# Patient Record
Sex: Female | Born: 1975 | Race: White | Hispanic: No | Smoking: Never smoker
Health system: Southern US, Community
[De-identification: ages and names within clinical notes are randomized; demographics above are authoritative.]

## PROBLEM LIST (undated history)

## (undated) ENCOUNTER — Emergency Department (HOSPITAL_COMMUNITY): Admission: EM | Payer: 59 | Source: Home / Self Care

## (undated) DIAGNOSIS — N888 Other specified noninflammatory disorders of cervix uteri: Secondary | ICD-10-CM

## (undated) DIAGNOSIS — Z309 Encounter for contraceptive management, unspecified: Secondary | ICD-10-CM

## (undated) DIAGNOSIS — R87629 Unspecified abnormal cytological findings in specimens from vagina: Secondary | ICD-10-CM

## (undated) DIAGNOSIS — F329 Major depressive disorder, single episode, unspecified: Secondary | ICD-10-CM

## (undated) DIAGNOSIS — G5600 Carpal tunnel syndrome, unspecified upper limb: Secondary | ICD-10-CM

## (undated) DIAGNOSIS — K589 Irritable bowel syndrome without diarrhea: Secondary | ICD-10-CM

## (undated) DIAGNOSIS — E039 Hypothyroidism, unspecified: Secondary | ICD-10-CM

## (undated) DIAGNOSIS — N921 Excessive and frequent menstruation with irregular cycle: Secondary | ICD-10-CM

## (undated) DIAGNOSIS — F419 Anxiety disorder, unspecified: Secondary | ICD-10-CM

## (undated) DIAGNOSIS — G43909 Migraine, unspecified, not intractable, without status migrainosus: Secondary | ICD-10-CM

## (undated) DIAGNOSIS — R6882 Decreased libido: Secondary | ICD-10-CM

## (undated) DIAGNOSIS — R5382 Chronic fatigue, unspecified: Secondary | ICD-10-CM

## (undated) DIAGNOSIS — E079 Disorder of thyroid, unspecified: Secondary | ICD-10-CM

## (undated) DIAGNOSIS — F482 Pseudobulbar affect: Secondary | ICD-10-CM

## (undated) DIAGNOSIS — F4310 Post-traumatic stress disorder, unspecified: Secondary | ICD-10-CM

## (undated) DIAGNOSIS — M3502 Sicca syndrome with lung involvement: Secondary | ICD-10-CM

## (undated) DIAGNOSIS — M351 Other overlap syndromes: Secondary | ICD-10-CM

## (undated) DIAGNOSIS — F909 Attention-deficit hyperactivity disorder, unspecified type: Secondary | ICD-10-CM

## (undated) DIAGNOSIS — Z8742 Personal history of other diseases of the female genital tract: Secondary | ICD-10-CM

## (undated) DIAGNOSIS — I10 Essential (primary) hypertension: Secondary | ICD-10-CM

## (undated) DIAGNOSIS — M359 Systemic involvement of connective tissue, unspecified: Secondary | ICD-10-CM

## (undated) HISTORY — DX: Major depressive disorder, single episode, unspecified: F32.9

## (undated) HISTORY — DX: Unspecified abnormal cytological findings in specimens from vagina: R87.629

## (undated) HISTORY — DX: Essential (primary) hypertension: I10

## (undated) HISTORY — DX: Encounter for contraceptive management, unspecified: Z30.9

## (undated) HISTORY — DX: Excessive and frequent menstruation with irregular cycle: N92.1

## (undated) HISTORY — PX: ABLATION: SHX5711

## (undated) HISTORY — PX: OTHER SURGICAL HISTORY: SHX169

## (undated) HISTORY — DX: Other overlap syndromes: M35.1

## (undated) HISTORY — DX: Other specified noninflammatory disorders of cervix uteri: N88.8

## (undated) HISTORY — DX: Attention-deficit hyperactivity disorder, unspecified type: F90.9

## (undated) HISTORY — DX: Decreased libido: R68.82

## (undated) HISTORY — DX: Pseudobulbar affect: F48.2

## (undated) HISTORY — DX: Personal history of other diseases of the female genital tract: Z87.42

---

## 2001-06-23 ENCOUNTER — Other Ambulatory Visit: Admission: RE | Admit: 2001-06-23 | Discharge: 2001-06-23 | Payer: Self-pay | Admitting: *Deleted

## 2001-09-21 ENCOUNTER — Ambulatory Visit (HOSPITAL_COMMUNITY): Admission: RE | Admit: 2001-09-21 | Discharge: 2001-09-21 | Payer: Self-pay | Admitting: *Deleted

## 2001-09-21 ENCOUNTER — Encounter: Payer: Self-pay | Admitting: *Deleted

## 2001-11-24 ENCOUNTER — Encounter: Payer: Self-pay | Admitting: *Deleted

## 2001-11-24 ENCOUNTER — Ambulatory Visit (HOSPITAL_COMMUNITY): Admission: RE | Admit: 2001-11-24 | Discharge: 2001-11-24 | Payer: Self-pay | Admitting: *Deleted

## 2002-01-03 ENCOUNTER — Ambulatory Visit (HOSPITAL_COMMUNITY): Admission: AD | Admit: 2002-01-03 | Discharge: 2002-01-03 | Payer: Self-pay | Admitting: *Deleted

## 2002-02-18 ENCOUNTER — Inpatient Hospital Stay (HOSPITAL_COMMUNITY): Admission: AD | Admit: 2002-02-18 | Discharge: 2002-02-20 | Payer: Self-pay | Admitting: *Deleted

## 2002-12-29 ENCOUNTER — Encounter: Payer: Self-pay | Admitting: *Deleted

## 2002-12-29 ENCOUNTER — Ambulatory Visit (HOSPITAL_COMMUNITY): Admission: RE | Admit: 2002-12-29 | Discharge: 2002-12-29 | Payer: Self-pay | Admitting: *Deleted

## 2003-05-30 ENCOUNTER — Inpatient Hospital Stay (HOSPITAL_COMMUNITY): Admission: AD | Admit: 2003-05-30 | Discharge: 2003-06-01 | Payer: Self-pay | Admitting: *Deleted

## 2003-11-13 ENCOUNTER — Emergency Department (HOSPITAL_COMMUNITY): Admission: EM | Admit: 2003-11-13 | Discharge: 2003-11-13 | Payer: Self-pay | Admitting: Emergency Medicine

## 2008-04-12 ENCOUNTER — Encounter: Payer: Self-pay | Admitting: Orthopedic Surgery

## 2008-04-12 ENCOUNTER — Emergency Department (HOSPITAL_COMMUNITY): Admission: EM | Admit: 2008-04-12 | Discharge: 2008-04-12 | Payer: Self-pay | Admitting: Emergency Medicine

## 2008-04-13 ENCOUNTER — Ambulatory Visit: Payer: Self-pay | Admitting: Orthopedic Surgery

## 2008-04-13 DIAGNOSIS — S52123A Displaced fracture of head of unspecified radius, initial encounter for closed fracture: Secondary | ICD-10-CM | POA: Insufficient documentation

## 2008-04-13 DIAGNOSIS — S82409A Unspecified fracture of shaft of unspecified fibula, initial encounter for closed fracture: Secondary | ICD-10-CM | POA: Insufficient documentation

## 2008-04-14 ENCOUNTER — Telehealth: Payer: Self-pay | Admitting: Orthopedic Surgery

## 2008-04-14 ENCOUNTER — Ambulatory Visit: Payer: Self-pay | Admitting: Orthopedic Surgery

## 2008-05-11 ENCOUNTER — Ambulatory Visit: Payer: Self-pay | Admitting: Orthopedic Surgery

## 2008-06-08 ENCOUNTER — Ambulatory Visit: Payer: Self-pay | Admitting: Orthopedic Surgery

## 2008-12-18 ENCOUNTER — Emergency Department (HOSPITAL_COMMUNITY): Admission: EM | Admit: 2008-12-18 | Discharge: 2008-12-18 | Payer: Self-pay | Admitting: Family Medicine

## 2009-03-08 ENCOUNTER — Ambulatory Visit (HOSPITAL_COMMUNITY): Admission: RE | Admit: 2009-03-08 | Discharge: 2009-03-08 | Payer: Self-pay | Admitting: Internal Medicine

## 2010-04-23 ENCOUNTER — Other Ambulatory Visit: Admission: RE | Admit: 2010-04-23 | Discharge: 2010-04-23 | Payer: Self-pay | Admitting: Obstetrics & Gynecology

## 2010-04-27 ENCOUNTER — Ambulatory Visit (HOSPITAL_COMMUNITY): Admission: RE | Admit: 2010-04-27 | Discharge: 2010-04-27 | Payer: Self-pay | Admitting: Family Medicine

## 2010-04-30 ENCOUNTER — Encounter (HOSPITAL_COMMUNITY): Admission: RE | Admit: 2010-04-30 | Discharge: 2010-05-30 | Payer: Self-pay | Admitting: Family Medicine

## 2010-05-29 ENCOUNTER — Ambulatory Visit: Payer: Self-pay | Admitting: Gastroenterology

## 2010-05-29 DIAGNOSIS — K589 Irritable bowel syndrome without diarrhea: Secondary | ICD-10-CM | POA: Insufficient documentation

## 2010-11-04 ENCOUNTER — Encounter: Payer: Self-pay | Admitting: Internal Medicine

## 2010-11-05 ENCOUNTER — Encounter: Payer: Self-pay | Admitting: Internal Medicine

## 2010-11-06 ENCOUNTER — Ambulatory Visit: Admit: 2010-11-06 | Payer: Self-pay | Admitting: Gastroenterology

## 2010-11-13 NOTE — Miscellaneous (Signed)
Summary: Orders Update  Clinical Lists Changes  Orders: Added new Test order of T-Tissue Transglutamase Ab IgA [phone]) - Signed
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
## 2010-12-02 ENCOUNTER — Emergency Department (HOSPITAL_COMMUNITY)
Admission: EM | Admit: 2010-12-02 | Discharge: 2010-12-03 | Disposition: A | Discharge status: Home / Self Care | Payer: 59 | Attending: Emergency Medicine | Admitting: Emergency Medicine

## 2010-12-02 ENCOUNTER — Emergency Department (HOSPITAL_COMMUNITY): Payer: 59

## 2010-12-02 DIAGNOSIS — G43909 Migraine, unspecified, not intractable, without status migrainosus: Secondary | ICD-10-CM | POA: Insufficient documentation

## 2011-03-01 NOTE — Discharge Summary (Signed)
   NAME:  JENAYA, SAAR                         ACCOUNT NO.:  [id number]   MEDICAL RECORD NO.:  [id number]                   PATIENT TYPE:  INP   LOCATION:  A420                                 FACILITY:  [facility]   PHYSICIAN:  Langley Gauss, M.D.                DATE OF BIRTH:  1976-06-17   DATE OF ADMISSION:  05/30/2003  DATE OF DISCHARGE:  06/01/2003                                 DISCHARGE SUMMARY   DIAGNOSIS:  Intrauterine pregnancy, for induction of labor.   PROCEDURES PERFORMED:  1. May 30, 2003, placement of continuous lumbar epidural analgesia.  2. May 30, 2003, spontaneous assisted vaginal delivery of viable vigorous     female infant.   DISPOSITION:  The patient is doing well with breast-feeding at the time of  discharge.  She will follow up in the office in four to six week's time for  discussion of birth control options.  Tentatively during the prenatal course  there was a discussion regarding the possibility of her husband, Perlie Gold,  having a vasectomy performed.  The patient is given the copy of standard  discharge instructions.   DISCHARGE MEDICATIONS:  Tylox #30 for pain relief from midline episiotomy  and repair.   PERTINENT LABORATORY STUDIES:  A positive blood type, RPR is nonreactive.  Hemoglobin and hematocrit on admission 11.4 and 34.1 with a white count of  8.6.  Postpartum day #1 10.5 and 31.1 with a white count of 11.9.   HOSPITAL COURSE:  See previous dictations.  The patient had epidural and  delivered in a well-controlled manner on May 30, 2003, vaginal delivery  of viable vigorous female infant, delivered over a midline episiotomy with  repair.  No complications associated with the delivery.  Postpartum the  patient did well.  She bonded well with the infant, had good family support.  Initially the infant did not latch on very well with the breast-feeding.  However, through the assistance of the nursing staff the patient began  becoming very  adept at feeding the infant.  Thus, with the infant doing  well, both mother and infant were discharged to home on postpartum day #1-  1/2.  Pediatric care provided by Dr. Ara Kussmaul.                                               Langley Gauss, M.D.    DC/MEDQ  D:  06/02/2003  T:  06/02/2003  Job:  [id number]   cc:   Francoise Schaumann. Halm, D.O.  [address], [address]  [place]  [place] [address]  Fax: [phone]

## 2011-03-01 NOTE — Op Note (Signed)
NAME:  Marisa Johnson, Marisa Johnson                         ACCOUNT NO.:  [id number]   MEDICAL RECORD NO.:  [id number]                   PATIENT TYPE:  INP   LOCATION:  LDR1                                 FACILITY:  [facility]   PHYSICIAN:  Langley Gauss, M.D.                DATE OF BIRTH:  June 28, 1976   DATE OF PROCEDURE:  DATE OF DISCHARGE:                                 OPERATIVE REPORT   DIAGNOSES:  A 40-week intrauterine pregnancy for induction of labor.   PROCEDURES:  1. Placement of epidural.  2. Spontaneous assisted vaginal delivery of an 8 pound 5 ounce female     infant.  3. Midline episiotomy repair.   DELIVERY PERFORMED:  By Dr. Roylene Reason. Lisette Grinder.   ESTIMATED BLOOD LOSS:  Less than 500 cc.   ANALGESIA:  Epidural supplemented with 30 cc of 1% lidocaine in the midline  of the perineal body.   SPECIMENS:  1. Arterial cord gas and cord blood to pathology.  2. Placenta is examined and noted to be apparently intake with a 3-vessel     umbilical cord.   SUMMARY:  Patient admitted for induction of labor; initial examination 2 cm  dilated. Amniotomy was performed with findings of clear amniotic fluid.  Pitocin was initiated.  With onset of active labor patient requested  epidural analgesic.  Epidural was placed without complications.  After  placement of the epidural the patient was examined and noted to be 4 cm  dilated and continuing to drain clear amniotic fluid. Fetal scalp electrode  was placed at that time.   Thereafter, the patient progressed rapidly along the labor curve to 9 cm  dilatation at which time she had significant pelvic pressure. At that time,  5 cc of 2% lidocaine was injected as a bolus to facilitate management of  labor.  With reaching complete dilatation the patient is placed in the  dorsal lithotomy position and Foley catheter is removed.  The patient is  sterilely prepped in the usual manner. She pushed very well during a short  second stage of labor with  distention to the perineal body. A small midline  episiotomy was performed.  The infant was then noted to deliver in a direct  OA position over this midline episiotomy.  A nuchal cord x2 with compression  was reduced at the time of delivery.  Spontaneous rotation then occurred to  a left anterior shoulder position.  Gentle downward traction combined with  expulsive efforts resulted in the delivery of this shoulder beneath the  pubic symphysis as well as the remainder of the infant without difficulty.   A spontaneous and vigorous breathing cry is noted.  The umbilical cord is  then milked towards the infant.  The cord is doubly clamped, and cut; and  the infant is handed to the nursing staff who placed it on the maternal  abdomen for immediate bonding purposes.  Arterial  cord gases and cord blood  are then obtained.  Gentle traction of the umbilical cord results in  separation which upon examination is an umbilical cord with an intact  placenta and a 3-vessel cord.  Examination of the genital tract reveals the  midline episiotomy has not extended. This is easily repaired utilizing  running 2-0 Vicryl suture on the muscles of the perineal body to include the  superficial transverse perineal muscle followed by #0 chromic running lock  suture to the vaginal mucosa followed by a single continuous layer of #0  chromic on the perineal body.   The patient tolerated the delivery very well.  She was taken out of the  dorsal lithotomy position and rolled to her side, at which time the epidural  catheter is removed with the blue tip noted to be intact.                                               Langley Gauss, M.D.    DC/MEDQ  D:  05/30/2003  T:  05/30/2003  Job:  [id number]

## 2011-03-01 NOTE — Discharge Summary (Signed)
[facility]  Patient:    Marisa Johnson, Marisa Johnson Visit Number: [id number] MRN: [id number]          Service Type: OBS Location: 4A A416 01 Attending Physician:  Jeri Cos. Dictated by:   Langley Gauss, M.D. Admit Date:  02/18/2002 Discharge Date: 02/20/2002   CC:         Vivia Ewing, D.O.   Discharge Summary  DIAGNOSES:  A 39 week intrauterine pregnancy for induction of labor.  PROCEDURES PERFORMED: 1. Placement of continuous lumbar analgesia. 2. Spontaneous assisted vaginal delivery 8 pounds 10 ounces female infant. 3. Midline episiotomy repair. 4. Infant circumcision.  DISPOSITION:  Patient is bottle feeding at time of discharge.  DISCHARGE MEDICATIONS:  Tylenol No.3 which can be taken with p.o. Motrin for pain relief, Hemocyte F one p.o. q.d. #30 with no refills.  LABORATORIES:  Admission hemoglobin and hematocrit 9.9/30.5 with postpartum day #1 of 8.3/26.4.  Patient is given a copy of standardized discharge instructions.  She will follow up in the office in four weeks time.  HOSPITAL COURSE:  See previous dictations.  Patient delivered fairly rapidly on Feb 18, 2002 utilizing epidural analgesic.  Postpartum she did very well. She bonded well with the infant.  Had no postpartum complications.  She was thus discharged to home on postpartum day #1-1/2.  Pediatrician Dr. Vivia Ewing. Dictated by:   Langley Gauss, M.D. Attending Physician:  Jeri Cos. DD:  02/22/02 TD:  02/23/02 Job: [id number]

## 2011-03-01 NOTE — Op Note (Signed)
   NAME:  Marisa, Johnson NO.:  [id number]   MEDICAL RECORD NO.:  [id number]                  PATIENT TYPE:   LOCATION:                                       FACILITY:   PHYSICIAN:  Langley Gauss, M.D.                DATE OF BIRTH:   DATE OF PROCEDURE:  05/30/2003  DATE OF DISCHARGE:                                 OPERATIVE REPORT   PROCEDURE:  Placement of epidural L2-L3 interspace performed by Dr. Langley Gauss.   COMPLICATIONS:  None.   SUMMARY:  An appropriate and informed consent obtained.  The patient had  epidurals placed with her 2 previous labor and deliveries.  Continuous  electronic fetal monitoring reveals a reassuring fetal heart rate.  The  patient was placed in the seated position and bony landmarks were  identified.  The L2-L3 interspace was chosen.  The patient's back is  sterilely prepped and draped utilizing the epidural kit; 5 cc of 1%  lidocaine plain injected at the midline of the L2-L3 interspace to raise a  small skin wheal.   The 17-gauge Touhy-Schliff needle was then utilized with loss of resistance  in an air-filled glass syringe to identify entry into the epidural space on  the first attempt without difficulty.  Initial test dose of 5 cc of 1.5%  lidocaine plus epinephrine injected through the epidural needle.  No signs  of CSF or intravascular injection obtained.  The catheter is inserted to a  depth of 5 cm.  The epidural needle is removed.  Aspiration test is  negative.  Second test dose of 2 cc of 1.5% lidocaine plus epinephrine  injected through the epidural catheter.  Again, no signs of CSF or  intravascular injection obtained.   Thus, the catheter is secured into place.  The patient is returned to the  supine position.  The infusion pump is then connected containing the  standard mixture. She will be treated with a bolus of 10 cc followed by a  continuous infusion rate of 12 cc per hour.  Following placement  of the  epidural the patient is noted to be 4 cm dilated, 80% effaced, vertex at a 0  station.  She continues to have a reassuring fetal heart rate.  Fetal scalp  electrode is placed at this time.  The patient does have evidence of a  bilateral block in placed at this time.                                               Langley Gauss, M.D.    DC/MEDQ  D:  05/30/2003  T:  05/30/2003  Job:  [id number]

## 2011-03-01 NOTE — Op Note (Signed)
[facility]  Patient:    Marisa Johnson, SPEARMAN Visit Number: [id number] MRN: [id number]          Service Type: OBS Location: 4A A416 01 Attending Physician:  Jeri Cos. Dictated by:   Roylene Reason. Lisette Grinder, M.D. Proc. Date: 02/18/02 Admit Date:  02/18/2002 Discharge Date: 02/20/2002   CC:         Vivia Ewing, D.O.   Operative Report  DELIVERY NOTE  PREOPERATIVE DIAGNOSIS:  A 39-week intrauterine pregnancy for induction of labor.  POSTOPERATIVE DIAGNOSIS:  A 39-week intrauterine pregnancy for induction of labor.  OPERATION/PROCEDURE:  1. Spontaneous assisted vaginal delivery of an 8 pound 10 ounce female infant.  2. Midline episiotomy repair.  SURGEON:  Roylene Reason. Lisette Grinder, M.D.  ANALGESIA:  Continuous lumbar epidural placed by Dr. Roylene Reason. Lisette Grinder.  This was supplemented with 30 cc of 1% lidocaine in the midline of the perineal body for the episiotomy.  ESTIMATED BLOOD LOSS:  Less than 500 cc.  SPECIMEN:  Arterial cord gas and cord bloods to laboratory.  Placenta was examined and noted to be apparently intact of a 3-vessel umbilical cord.  SUMMARY:  The patient was admitted for induction of labor secondary to psychosocial factors.  Initial examination in the a.m. she was noted to be 2 cm dilated.  I was unable to perform an amniotomy secondary to the patients poor tolerance of the the vaginal examination.  Thus Pitocin induction was initiated.  With onset of active labor pattern a continuous lumbar epidural was placed. This allowed adequate examination of the pelvis revealing the cervix, now, to be 4 cm dilated, 70% effaced and 0 station.  Amniotomy was performed utilizing a fetal scalp electrode.  Clear amniotic fluid was noted.  Fetal scalp electrode was placed.  The patient, thereafter, was continued on Pitocin.  After routine active phase of labor she progressed very rapidly to a complete dilation at which time she had appropriate urge  to push.  She was placed in the dorsolithotomy position, prepped and draped in the usual sterile manner. Foley catheter was removed.  She pushed well with descent of the vertex to the perineal floor at which time lidocaine was injection with distention of the perineum.  A small midline episiotomy was performed.  The infant then delivered in a direct OA position noting midline episiotomy without extension.  The mouth and nares were bulb suctioned of clear amniotic fluid.  Renewed expulsive efforts resulted in a spontaneous rotation to a left anterior shoulder position.  Expulsive efforts plus gentle downward traction resulted in delivery of this anterior shoulder on the pubis symphysis without difficulty.  The remainder of the infant, likewise was delivered without difficulty and the umbilical cord was then milked towards the infant.  The cord was doubly clamped and cut.  Gentle traction on the umbilical cord resulted in separation which, upon examination, appears to be an intact placenta with a 3-vessel umbilical cord.  Arterial cord gas and cord blood had been obtained.  Examination of the genital tract reveals intact periurethral area.  The midline episiotomy was not extended.  This was easily repaired utilizing #0 chromic in a running lock fashion to the vaginal mucosa followed by a 2-layer closure on the perineum.  A deep interrupted layer of #0 Vicryl reapproximating the superficial transverse perineal muscle and then the skin edges are completely reapproximated utilizing continuous running #0 chromic suture.  This results in an excellent repair.  Great care was taken to avoid any strictures  at the vaginal opening; and the mucosa is only loosely approximated.  The patient tolerated the delivery very well.  She has a firm uterus with minimal blood loss.  She is taken out of the dorsolithotomy position, rolled to her side, at which time the epidural catheter is removed with the  blue-tip noted to be intact.  Both mother and infant doing very well following delivery.  The patient plans on utilizing Dr. Milford Cage for pediatric care. Dictated by:   Roylene Reason. Lisette Grinder, M.D. Attending Physician:  Jeri Cos. DD:  02/18/02 TD:  02/20/02 Job: [id number]

## 2011-03-01 NOTE — Op Note (Signed)
[facility]  Patient:    Marisa Johnson, Marisa Johnson Visit Number: [id number] MRN: [id number]          Service Type: OBS Location: 4A A416 01 Attending Physician:  Jeri Cos. Dictated by:   Roylene Reason. Lisette Grinder, M.D. Proc. Date: 02/18/02 Admit Date:  02/18/2002 Discharge Date: 02/20/2002                             Operative Report  OBSTETRICAL PROCEDURE NOTE  OPERATION/PROCEDURE:  Placement of continuous lumbar epidural analgesia  at the L3-L4 interspace performed by Dr. Roylene Reason. Lisette Grinder.  SURGEON:  Roylene Reason. Lisette Grinder, M.D.  COMPLICATIONS:  None.  SUMMARY:  An appropriate and informed consent was obtained.  Continuous electronic fetal monitioring was performed.  The patient was placed in a seated position, at which time bony landmarks were identified.  The L3-L4 interspace was marked in the midline.  The patients back was then sterilely prepped in the usual manner utilizing the epidural kit.  Lidocaine 5 cc was injected at the L4 interspace to raise a small skin wheal. The 17-gauge Tuohy Schliff needle was then utilized with loss of resistance in an air filled glass syringe to identify entry into the epidural space on the first attempt without any difficulty.  Initial test dose of 5 cc of 1.5% lidocaine plus epinephrine was injected through the epidural needle.  No signs of CSF or intravascular injection obtained.  Thus, the epidural catheter was inserted to a depth of 5 cm.  The epidural needle was removed, aspiration test was negative.  Second test dose of 2 cc of 1.5% lidocaine plus epinephrine was injected through the epidural catheter. Again, no signs of CSF or intravascular injection obtained.  The patient is having tingling in the feet consistent with a properly setting up epidural block.  Thus she is connected to the to the infusion pump containing the standard mixture.  She will be treated with a bolus of 9 cc followed by  continuous infusion rate of 12 cc/h.  After completion, the patient is returned to the left lateral tilt position.  Examination immediately following placement of the epidural 4 cm dilated, 70% effaced, vertex at a 0 station.  Amniotomy is performed with findings of clear amniotic fluid and a reassuring fetal heart rate noted after placement of the scalp electrode.  The patient did have evidence of an excellent setting up bilateral block with appropriate labor analgesia.                 PREOPERATIVE DIAGNOSIS:  POSTOPERATIVE DIAGNOSIS:  OPERATION/PROCEDURE:  SURGEON: Dictated by:   Roylene Reason. Lisette Grinder, M.D. Attending Physician:  Jeri Cos. DD:  02/18/02 TD:  02/20/02 Job: [id number]

## 2012-01-26 ENCOUNTER — Ambulatory Visit (HOSPITAL_COMMUNITY)
Admission: RE | Admit: 2012-01-26 | Discharge: 2012-01-26 | Disposition: A | Discharge status: Home / Self Care | Payer: Worker's Compensation | Source: Other Acute Inpatient Hospital | Attending: Emergency Medicine | Admitting: Emergency Medicine

## 2012-07-09 ENCOUNTER — Other Ambulatory Visit: Payer: Self-pay | Admitting: Obstetrics & Gynecology

## 2012-08-11 ENCOUNTER — Other Ambulatory Visit: Payer: Self-pay | Admitting: Adult Health

## 2012-08-11 ENCOUNTER — Other Ambulatory Visit (HOSPITAL_COMMUNITY)
Admission: RE | Admit: 2012-08-11 | Discharge: 2012-08-11 | Disposition: A | Discharge status: Home / Self Care | Payer: 59 | Source: Ambulatory Visit | Attending: Obstetrics and Gynecology | Admitting: Obstetrics and Gynecology

## 2012-08-11 DIAGNOSIS — Z01419 Encounter for gynecological examination (general) (routine) without abnormal findings: Secondary | ICD-10-CM | POA: Insufficient documentation

## 2012-08-11 DIAGNOSIS — R87810 Cervical high risk human papillomavirus (HPV) DNA test positive: Secondary | ICD-10-CM | POA: Insufficient documentation

## 2012-08-11 DIAGNOSIS — Z1151 Encounter for screening for human papillomavirus (HPV): Secondary | ICD-10-CM | POA: Insufficient documentation

## 2012-09-23 ENCOUNTER — Other Ambulatory Visit: Payer: Self-pay | Admitting: Obstetrics & Gynecology

## 2012-12-29 ENCOUNTER — Other Ambulatory Visit: Payer: Self-pay | Admitting: Obstetrics & Gynecology

## 2012-12-29 MED ORDER — LEVONORG-ETH ESTRAD TRIPHASIC PO TABS: 1.0000 | ORAL_TABLET | Freq: Every day | ORAL | Status: DC

## 2013-05-03 ENCOUNTER — Encounter (HOSPITAL_COMMUNITY): Payer: Self-pay

## 2013-05-03 ENCOUNTER — Emergency Department (HOSPITAL_COMMUNITY)
Admission: EM | Admit: 2013-05-03 | Discharge: 2013-05-03 | Disposition: A | Discharge status: Home / Self Care | Payer: 59 | Attending: Emergency Medicine | Admitting: Emergency Medicine

## 2013-05-03 DIAGNOSIS — Z8659 Personal history of other mental and behavioral disorders: Secondary | ICD-10-CM | POA: Insufficient documentation

## 2013-05-03 DIAGNOSIS — R6883 Chills (without fever): Secondary | ICD-10-CM | POA: Insufficient documentation

## 2013-05-03 DIAGNOSIS — Z79899 Other long term (current) drug therapy: Secondary | ICD-10-CM | POA: Insufficient documentation

## 2013-05-03 DIAGNOSIS — R300 Dysuria: Secondary | ICD-10-CM | POA: Insufficient documentation

## 2013-05-03 DIAGNOSIS — N390 Urinary tract infection, site not specified: Secondary | ICD-10-CM | POA: Insufficient documentation

## 2013-05-03 DIAGNOSIS — F411 Generalized anxiety disorder: Secondary | ICD-10-CM | POA: Insufficient documentation

## 2013-05-03 DIAGNOSIS — E079 Disorder of thyroid, unspecified: Secondary | ICD-10-CM | POA: Insufficient documentation

## 2013-05-03 DIAGNOSIS — Z3202 Encounter for pregnancy test, result negative: Secondary | ICD-10-CM | POA: Insufficient documentation

## 2013-05-03 DIAGNOSIS — M549 Dorsalgia, unspecified: Secondary | ICD-10-CM | POA: Insufficient documentation

## 2013-05-03 HISTORY — DX: Disorder of thyroid, unspecified: E07.9

## 2013-05-03 HISTORY — DX: Post-traumatic stress disorder, unspecified: F43.10

## 2013-05-03 HISTORY — DX: Anxiety disorder, unspecified: F41.9

## 2013-05-03 LAB — URINALYSIS, ROUTINE W REFLEX MICROSCOPIC
Bilirubin Urine: NEGATIVE
Hgb urine dipstick: NEGATIVE
Ketones, ur: NEGATIVE mg/dL
Specific Gravity, Urine: 1.015 (ref 1.005–1.030)
Urobilinogen, UA: 0.2 mg/dL (ref 0.0–1.0)

## 2013-05-03 LAB — URINE MICROSCOPIC-ADD ON

## 2013-05-03 MED ORDER — PHENAZOPYRIDINE HCL 200 MG PO TABS: 200.0000 mg | ORAL_TABLET | Freq: Three times a day (TID) | ORAL | Status: DC | PRN

## 2013-05-03 MED ORDER — PHENAZOPYRIDINE HCL 100 MG PO TABS
200.0000 mg | ORAL_TABLET | Freq: Once | ORAL | Status: AC
  Administered 2013-05-03: 200 mg via ORAL
  Filled 2013-05-03: qty 2

## 2013-05-03 MED ORDER — CEFTRIAXONE SODIUM 1 G IJ SOLR
1.0000 g | Freq: Once | INTRAMUSCULAR | Status: AC
  Administered 2013-05-03: 1 g via INTRAMUSCULAR
  Filled 2013-05-03: qty 10

## 2013-05-03 MED ORDER — LIDOCAINE HCL (PF) 1 % IJ SOLN
INTRAMUSCULAR | Status: AC
  Administered 2013-05-03: 2.1 mL
  Filled 2013-05-03: qty 5

## 2013-05-03 MED ORDER — PHENAZOPYRIDINE HCL 100 MG PO TABS: 100.0000 mg | ORAL_TABLET | Freq: Once | ORAL | Status: DC

## 2013-05-03 MED ORDER — ONDANSETRON 8 MG PO TBDP
8.0000 mg | ORAL_TABLET | Freq: Once | ORAL | Status: AC
  Administered 2013-05-03: 8 mg via ORAL
  Filled 2013-05-03: qty 1

## 2013-05-03 MED ORDER — CEPHALEXIN 500 MG PO CAPS: 500.0000 mg | ORAL_CAPSULE | Freq: Four times a day (QID) | ORAL | Status: DC

## 2013-05-03 NOTE — ED Notes (Signed)
Currently being tx for UTI, symptoms now worse with increased pain left flank and dysuria

## 2013-05-03 NOTE — ED Provider Notes (Signed)
History    CSN: [id number] Arrival date & time 05/03/13  0055  First MD Initiated Contact with Patient 05/03/13 0111     Chief Complaint  Patient presents with  . Flank Pain   Patient is a 37 y.o. female presenting with flank pain. The history is provided by the patient.  Flank Pain This is a new problem. The current episode started yesterday. The problem occurs constantly. The problem has been gradually worsening. Associated symptoms include abdominal pain. Pertinent negatives include no chest pain and no shortness of breath. Nothing aggravates the symptoms. Nothing relieves the symptoms. Treatments tried: cipro. The treatment provided no relief.  pt reports she was diagnosed with UTI recently by her PCP She was placed on cipro, completed the course but her symptoms have not improved She reports dysuria, mild abdominal pain and now having left flank pain No fever/vomiting She reports similar to prior UTIs Past Medical History  Diagnosis Date  . PTSD (post-traumatic stress disorder)   . Thyroid disease   . Anxiety    History reviewed. No pertinent past surgical history. No family history on file. History  Substance Use Topics  . Smoking status: Never Smoker   . Smokeless tobacco: Not on file  . Alcohol Use: Yes   OB History   Grav Para Term Preterm Abortions TAB SAB Ect Mult Living                 Review of Systems  Constitutional: Positive for chills. Negative for fever.  Respiratory: Negative for shortness of breath.   Cardiovascular: Negative for chest pain.  Gastrointestinal: Positive for abdominal pain.  Genitourinary: Positive for dysuria and flank pain.  Musculoskeletal: Positive for back pain.  Neurological: Negative for weakness.  All other systems reviewed and are negative.    Allergies  Reglan  Home Medications   Current Outpatient Rx  Name  Route  Sig  Dispense  Refill  . buPROPion (WELLBUTRIN SR) 150 MG 12 hr tablet   Oral   Take 150 mg by  mouth once.         . clonazePAM (KLONOPIN) 1 MG tablet   Oral   Take 1 mg by mouth 2 (two) times daily as needed for anxiety.         . lamoTRIgine (LAMICTAL) 100 MG tablet   Oral   Take 100 mg by mouth daily.         Marland Kitchen levothyroxine (SYNTHROID, LEVOTHROID) 112 MCG tablet   Oral   Take 112 mcg by mouth daily before breakfast.         . traZODone (DESYREL) 50 MG tablet   Oral   Take 12.5 mg by mouth at bedtime.         . cephALEXin (KEFLEX) 500 MG capsule   Oral   Take 1 capsule (500 mg total) by mouth 4 (four) times daily.   40 capsule   0   . levonorgestrel-ethinyl estradiol (TRIVORA, 28,) tablet   Oral   Take 1 tablet by mouth daily.   1 Package   11   . phenazopyridine (PYRIDIUM) 200 MG tablet   Oral   Take 1 tablet (200 mg total) by mouth 3 (three) times daily as needed for pain.   9 tablet   0    BP 126/84  Pulse 89  Temp(Src) 98.1 F (36.7 C) (Oral)  Ht 5\' 3"  (1.6 m)  Wt 139 lb (63.05 kg)  BMI 24.63 kg/m2  SpO2 97% Physical Exam  CONSTITUTIONAL: Well developed/well nourished HEAD: Normocephalic/atraumatic EYES: EOMI ENMT: Mucous membranes moist NECK: supple no meningeal signs CV: S1/S2 noted, no murmurs/rubs/gallops noted LUNGS: Lungs are clear to auscultation bilaterally, no apparent distress ABDOMEN: soft, nontender, no rebound or guarding ZO:XWRU left cva tenderness NEURO: Pt is awake/alert, moves all extremitiesx4 EXTREMITIES: pulses normal, full ROM SKIN: warm, color normal PSYCH: no abnormalities of mood noted  ED Course  Procedures (including critical care time) Labs Reviewed  URINALYSIS, ROUTINE W REFLEX MICROSCOPIC - Abnormal; Notable for the following:    APPearance HAZY (*)    Nitrite POSITIVE (*)    Leukocytes, UA SMALL (*)    All other components within normal limits  URINE MICROSCOPIC-ADD ON - Abnormal; Notable for the following:    Squamous Epithelial / LPF FEW (*)    Bacteria, UA MANY (*)    All other components  within normal limits  URINE CULTURE  PREGNANCY, URINE   No results found. 1. UTI (lower urinary tract infection)     MDM  Nursing notes including past medical history and social history reviewed and considered in documentation Labs/vital reviewed and considered   Pt with uti.  She now reports flank pain, could have early pyelo She is nontoxic and well appearing Loaded with rocephin here Urine for culture and will start keflex She requests pyridium  Joya Gaskins, MD 05/03/13 [id number]

## 2013-05-04 LAB — URINE CULTURE: Colony Count: >=100000

## 2013-05-24 ENCOUNTER — Other Ambulatory Visit (HOSPITAL_COMMUNITY): Payer: Self-pay | Admitting: Internal Medicine

## 2013-05-24 DIAGNOSIS — N390 Urinary tract infection, site not specified: Secondary | ICD-10-CM

## 2013-05-26 ENCOUNTER — Ambulatory Visit (HOSPITAL_COMMUNITY)
Admission: RE | Admit: 2013-05-26 | Discharge: 2013-05-26 | Disposition: A | Discharge status: Home / Self Care | Payer: 59 | Source: Ambulatory Visit | Attending: Internal Medicine | Admitting: Internal Medicine

## 2013-05-26 DIAGNOSIS — N390 Urinary tract infection, site not specified: Secondary | ICD-10-CM | POA: Insufficient documentation

## 2013-09-01 ENCOUNTER — Ambulatory Visit: Payer: Self-pay | Admitting: Gastroenterology

## 2013-09-16 ENCOUNTER — Encounter (INDEPENDENT_AMBULATORY_CARE_PROVIDER_SITE_OTHER): Payer: Self-pay

## 2013-09-16 ENCOUNTER — Encounter: Payer: Self-pay | Admitting: Gastroenterology

## 2013-09-16 ENCOUNTER — Ambulatory Visit (INDEPENDENT_AMBULATORY_CARE_PROVIDER_SITE_OTHER): Payer: 59 | Admitting: Gastroenterology

## 2013-09-16 VITALS — BP 101/68 | HR 75 | Temp 97.0°F | Ht 63.0 in | Wt 144.6 lb

## 2013-09-16 DIAGNOSIS — R112 Nausea with vomiting, unspecified: Secondary | ICD-10-CM

## 2013-09-16 DIAGNOSIS — K589 Irritable bowel syndrome without diarrhea: Secondary | ICD-10-CM

## 2013-09-16 MED ORDER — LINACLOTIDE 290 MCG PO CAPS: 290.0000 ug | ORAL_CAPSULE | Freq: Every day | ORAL | Status: DC

## 2013-09-16 NOTE — Patient Instructions (Signed)
Please have blood work done today and pregnancy screen.   Start taking Linzess 1 capsule each morning, 30 minutes before breakfast. I sent the prescription to your pharmacy and provided a voucher for you.  Please call in about a week with an update. We may need to do an upper endoscopy if the nausea and vomiting is not better with better bowel habits.

## 2013-09-16 NOTE — Progress Notes (Signed)
Primary Care Physician:  Carylon Perches, MD Primary Gastroenterologist:  Dr. Darrick Penna    Chief Complaint  Patient presents with  . Abdominal Pain  . Nausea  . Emesis  . Constipation    HPI:   Marisa Johnson presents today secondary to abdominal pain, nausea, and vomiting. Last seen in Aug 2011 by Dr. Darrick Penna. 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 She was instructed to follow-up in 4 months but did not return. Celiac serologies ordered but never done.   Sees psychiatrist once every 3 months. Diagnosed with Bipolar. 3 month history of nausea, vomiting, bloating, diffuse abdominal pain from epigastric region to lower abdomen. Felt like a knife in LUQ. Felt like intestines were going to rupture. Radiating pain from LUQ to left breast and wrapped around back. BM every 2 weeks. Lots of mucus. Small balls, finally had what looked like a sweet potato. Lower back discomfort, mild. Severe right before bowel movement.   Nausea worsened with eating. Intermittent vomiting after eating. Constant, underlying, left-sided abdomen. No bright red blood per rectum, no melena. Ibuprofen every once in awhile for Migraine if no Excedrin. Denies routine Excedrin use. Denies reflux, indigestion, heartburn, dysphagia. Nausea more often than vomiting, 4-5 times per week. Vomiting several times every other week.   Had vaginal bleeding, old blood appearing, spotting. No rectal bleeding. Has taken 3 pregnancy tests that are negative. Boyfriend has had a vasectomy.   Past Medical History  Diagnosis Date  . PTSD (post-traumatic stress disorder)   . Thyroid disease   . Anxiety   . Bipolar disorder   . Major depressive disorder     Past Surgical History  Procedure Laterality Date  . None      Current Outpatient Prescriptions  Medication Sig Dispense Refill  . buPROPion (WELLBUTRIN SR) 150 MG 12 hr tablet Take 150 mg by mouth once.      . clonazePAM (KLONOPIN) 1 MG tablet Take 1 mg by mouth 2 (two) times daily as needed for  anxiety. Pt takes 1 mg at bedtime      . lamoTRIgine (LAMICTAL) 100 MG tablet Take 100 mg by mouth daily. Pt takes 200 mg at bedtime      . levonorgestrel-ethinyl estradiol (TRIVORA, 28,) tablet Take 1 tablet by mouth daily.  1 Package  11  . levothyroxine (SYNTHROID, LEVOTHROID) 100 MCG tablet Take 100 mcg by mouth daily before breakfast.      . lurasidone (LATUDA) 40 MG TABS tablet Take 40 mg by mouth daily with breakfast. Pt takes 20 mg at bedtime      . phenazopyridine (PYRIDIUM) 200 MG tablet Take 1 tablet (200 mg total) by mouth 3 (three) times daily as needed for pain.  9 tablet  0  . traZODone (DESYREL) 50 MG tablet Take 50 mg by mouth at bedtime. Pt takes 2 tablets at bedtime       No current facility-administered medications for this visit.    Allergies as of 09/16/2013 - Review Complete 09/16/2013  Allergen Reaction Noted  . Abilify [aripiprazole] Other (See Comments) 09/16/2013  . Reglan [metoclopramide]  05/03/2013    Family History  Problem Relation Age of Onset  . Colon cancer Neg Hx     History   Social History  . Marital Status: Legally Separated    Spouse Name: N/A    Number of Children: N/A  . Years of Education: N/A   Occupational History  . 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  Smoking status: Never Smoker   . Smokeless tobacco: Not on file     Comment: Never smoker  . Alcohol Use: Yes     Comment: occasionally  . Drug Use: No  . Sexual Activity: Yes    Birth Control/ Protection: OCP   Other Topics Concern  . Not on file   Social History Narrative  . No narrative on file    Review of Systems: As mentioned in HPI.   Physical Exam: BP 101/68  Pulse 75  Temp(Src) 97 F (36.1 C) (Oral)  Ht 5\' 3"  (1.6 m)  Wt 144 lb 9.6 oz (65.59 kg)  BMI 25.62 kg/m2  LMP 08/23/2013 General:   Alert and oriented. Pleasant and cooperative. Well-nourished and well-developed.  Head:  Normocephalic and atraumatic. Eyes:  Without icterus,  sclera clear and conjunctiva pink.  Ears:  Normal auditory acuity. Nose:  No deformity, discharge,  or lesions. Mouth:  No deformity or lesions, oral mucosa pink.  Neck:  Supple, without mass or thyromegaly. Lungs:  Clear to auscultation bilaterally. No wheezes, rales, or rhonchi. No distress.  Heart:  S1, S2 present without murmurs appreciated.  Abdomen:  +BS, soft, very mild TTP epigastric and left-sided abdomen. non-distended. No HSM noted. No guarding or rebound. No masses appreciated.  Rectal:  Deferred  Msk:  Symmetrical without gross deformities. Normal posture. Extremities:  Without clubbing or edema. Neurologic:  Alert and  oriented x4;  grossly normal neurologically. Skin:  Intact without significant lesions or rashes. Cervical Nodes:  No significant cervical adenopathy. Psych:  Alert and cooperative. Normal mood and affect.

## 2013-09-17 LAB — TISSUE TRANSGLUTAMINASE, IGA: Tissue Transglutaminase Ab, IgA: 2 U/mL (ref <20)

## 2013-09-17 LAB — IGA: IgA: 93 mg/dL (ref 69–380)

## 2013-09-20 DIAGNOSIS — R112 Nausea with vomiting, unspecified: Secondary | ICD-10-CM | POA: Insufficient documentation

## 2013-09-20 NOTE — Assessment & Plan Note (Signed)
Associated with diffuse abdominal pain, bloating, and in the setting of severe constipation. I feel constipation may be playing a role here; however, unable to rule out occult gastritis, PUD. Will trial Linzess to deal with constipation and proceed with EGD IF no improvement in symptoms despite bowel regimen. Doubt gastroparesis as culprit.

## 2013-09-20 NOTE — Assessment & Plan Note (Signed)
Significant constipation, likely source of left-sided and diffuse abdominal discomfort. Needs aggressive management of constipation prior to any invasive procedures. Needs celiac serologies as previously recommended. No concerning lower GI features.   Trial of Linzess 290 mcg daily. Voucher provided Celiac serologies Pregnancy screen Progress report in about a week. May need EGD if no improvement of abdominal pain, nausea/vomiting with better bowel regimen (see N/V)

## 2013-09-21 ENCOUNTER — Other Ambulatory Visit: Payer: Self-pay | Admitting: Obstetrics & Gynecology

## 2013-09-21 ENCOUNTER — Ambulatory Visit (INDEPENDENT_AMBULATORY_CARE_PROVIDER_SITE_OTHER): Payer: 59 | Admitting: Obstetrics & Gynecology

## 2013-09-21 ENCOUNTER — Ambulatory Visit (INDEPENDENT_AMBULATORY_CARE_PROVIDER_SITE_OTHER): Payer: 59

## 2013-09-21 ENCOUNTER — Encounter: Payer: Self-pay | Admitting: Obstetrics & Gynecology

## 2013-09-21 VITALS — BP 100/70 | Ht 63.0 in | Wt 143.0 lb

## 2013-09-21 DIAGNOSIS — N949 Unspecified condition associated with female genital organs and menstrual cycle: Secondary | ICD-10-CM

## 2013-09-21 DIAGNOSIS — R1032 Left lower quadrant pain: Secondary | ICD-10-CM | POA: Insufficient documentation

## 2013-09-21 MED ORDER — DESOGESTREL-ETHINYL ESTRADIOL 0.15-0.02/0.01 MG (21/5) PO TABS: 1.0000 | ORAL_TABLET | Freq: Every day | ORAL | Status: DC

## 2013-09-21 NOTE — Progress Notes (Signed)
Quick Note:  Called and left message on Vm that we will schedule OV in 3 mon for follow up per AS. ______

## 2013-09-21 NOTE — Progress Notes (Signed)
Quick Note:  Called and informed pt. She said she she is having regular BM's daily ( just skipped one day) and she is not having the nausea and vomiting now. She will call if she needs anything. ______

## 2013-09-21 NOTE — Progress Notes (Signed)
CC'd to pcp 
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
## 2013-09-21 NOTE — Progress Notes (Signed)
Quick Note:  Wonderful! Let's have her return to see me in 3 months. ______

## 2013-09-21 NOTE — Addendum Note (Signed)
Addended by: Lazaro Arms on: 09/21/2013 04:38 PM   Modules accepted: Orders

## 2013-09-21 NOTE — Progress Notes (Signed)
Quick Note:  Negative celiac serologies and negative pregnancy test.  How is patient doing with Linzess? If continued nausea and vomiting, she will need an EGD with Dr. Darrick Penna with Propofol. ______

## 2013-09-23 ENCOUNTER — Other Ambulatory Visit: Payer: Self-pay | Admitting: Adult Health

## 2013-09-28 ENCOUNTER — Other Ambulatory Visit: Payer: Self-pay | Admitting: Adult Health

## 2013-09-29 ENCOUNTER — Ambulatory Visit: Payer: Self-pay | Admitting: Gastroenterology

## 2013-10-12 ENCOUNTER — Other Ambulatory Visit: Payer: Self-pay | Admitting: Obstetrics & Gynecology

## 2013-11-12 ENCOUNTER — Other Ambulatory Visit: Payer: Self-pay | Admitting: Nurse Practitioner

## 2013-12-24 ENCOUNTER — Encounter (HOSPITAL_COMMUNITY): Payer: Self-pay | Admitting: Emergency Medicine

## 2013-12-24 ENCOUNTER — Emergency Department (HOSPITAL_COMMUNITY): Payer: 59

## 2013-12-24 ENCOUNTER — Emergency Department (HOSPITAL_COMMUNITY)
Admission: EM | Admit: 2013-12-24 | Discharge: 2013-12-24 | Disposition: A | Discharge status: Home / Self Care | Payer: 59 | Attending: Emergency Medicine | Admitting: Emergency Medicine

## 2013-12-24 DIAGNOSIS — IMO0002 Reserved for concepts with insufficient information to code with codable children: Secondary | ICD-10-CM | POA: Insufficient documentation

## 2013-12-24 DIAGNOSIS — H538 Other visual disturbances: Secondary | ICD-10-CM | POA: Insufficient documentation

## 2013-12-24 DIAGNOSIS — F319 Bipolar disorder, unspecified: Secondary | ICD-10-CM | POA: Insufficient documentation

## 2013-12-24 DIAGNOSIS — R5383 Other fatigue: Secondary | ICD-10-CM

## 2013-12-24 DIAGNOSIS — R5381 Other malaise: Secondary | ICD-10-CM | POA: Insufficient documentation

## 2013-12-24 DIAGNOSIS — R519 Headache, unspecified: Secondary | ICD-10-CM

## 2013-12-24 DIAGNOSIS — F411 Generalized anxiety disorder: Secondary | ICD-10-CM | POA: Insufficient documentation

## 2013-12-24 DIAGNOSIS — R110 Nausea: Secondary | ICD-10-CM | POA: Insufficient documentation

## 2013-12-24 DIAGNOSIS — E079 Disorder of thyroid, unspecified: Secondary | ICD-10-CM | POA: Insufficient documentation

## 2013-12-24 DIAGNOSIS — F4310 Post-traumatic stress disorder, unspecified: Secondary | ICD-10-CM | POA: Insufficient documentation

## 2013-12-24 DIAGNOSIS — M542 Cervicalgia: Secondary | ICD-10-CM | POA: Insufficient documentation

## 2013-12-24 DIAGNOSIS — Z3202 Encounter for pregnancy test, result negative: Secondary | ICD-10-CM | POA: Insufficient documentation

## 2013-12-24 DIAGNOSIS — K589 Irritable bowel syndrome without diarrhea: Secondary | ICD-10-CM | POA: Insufficient documentation

## 2013-12-24 DIAGNOSIS — Z79899 Other long term (current) drug therapy: Secondary | ICD-10-CM | POA: Insufficient documentation

## 2013-12-24 DIAGNOSIS — R51 Headache: Secondary | ICD-10-CM | POA: Insufficient documentation

## 2013-12-24 LAB — URINALYSIS, ROUTINE W REFLEX MICROSCOPIC
Bilirubin Urine: NEGATIVE
Glucose, UA: NEGATIVE mg/dL
Ketones, ur: NEGATIVE mg/dL
Leukocytes, UA: NEGATIVE
Nitrite: NEGATIVE
UROBILINOGEN UA: 0.2 mg/dL (ref 0.0–1.0)
pH: 5.5 (ref 5.0–8.0)

## 2013-12-24 LAB — COMPREHENSIVE METABOLIC PANEL
ALT: 10 U/L (ref 0–35)
AST: 13 U/L (ref 0–37)
Albumin: 3.8 g/dL (ref 3.5–5.2)
Alkaline Phosphatase: 51 U/L (ref 39–117)
BILIRUBIN TOTAL: 0.3 mg/dL (ref 0.3–1.2)
BUN: 14 mg/dL (ref 6–23)
CHLORIDE: 105 meq/L (ref 96–112)
CO2: 26 mEq/L (ref 19–32)
Calcium: 9.2 mg/dL (ref 8.4–10.5)
Creatinine, Ser: 0.86 mg/dL (ref 0.50–1.10)
GFR calc Af Amer: >90 mL/min (ref >90)
GFR, EST NON AFRICAN AMERICAN: 85 mL/min — AB (ref >90)
Glucose, Bld: 88 mg/dL (ref 70–99)
POTASSIUM: 3.6 meq/L — AB (ref 3.7–5.3)
Sodium: 142 mEq/L (ref 137–147)
Total Protein: 7.1 g/dL (ref 6.0–8.3)

## 2013-12-24 LAB — CBC WITH DIFFERENTIAL/PLATELET
Basophils Absolute: 0 10*3/uL (ref 0.0–0.1)
Basophils Relative: 0 % (ref 0–1)
Eosinophils Absolute: 0 10*3/uL (ref 0.0–0.7)
Eosinophils Relative: 1 % (ref 0–5)
HEMATOCRIT: 36.5 % (ref 36.0–46.0)
Hemoglobin: 12.2 g/dL (ref 12.0–15.0)
LYMPHS PCT: 54 % — AB (ref 12–46)
Lymphs Abs: 3.8 10*3/uL (ref 0.7–4.0)
MCH: 31 pg (ref 26.0–34.0)
MCHC: 33.4 g/dL (ref 30.0–36.0)
MCV: 92.6 fL (ref 78.0–100.0)
Monocytes Absolute: 0.5 10*3/uL (ref 0.1–1.0)
Monocytes Relative: 7 % (ref 3–12)
NEUTROS ABS: 2.6 10*3/uL (ref 1.7–7.7)
Neutrophils Relative %: 38 % — ABNORMAL LOW (ref 43–77)
PLATELETS: 213 10*3/uL (ref 150–400)
RBC: 3.94 MIL/uL (ref 3.87–5.11)
RDW: 13.1 % (ref 11.5–15.5)
WBC: 7 10*3/uL (ref 4.0–10.5)

## 2013-12-24 LAB — PREGNANCY, URINE: Preg Test, Ur: NEGATIVE

## 2013-12-24 LAB — D-DIMER, QUANTITATIVE (NOT AT ARMC): D DIMER QUANT: 0.36 ug{FEU}/mL (ref 0.00–0.48)

## 2013-12-24 LAB — URINE MICROSCOPIC-ADD ON

## 2013-12-24 MED ORDER — KETOROLAC TROMETHAMINE 30 MG/ML IJ SOLN
30.0000 mg | Freq: Once | INTRAMUSCULAR | Status: AC
  Administered 2013-12-24: 30 mg via INTRAVENOUS
  Filled 2013-12-24: qty 1

## 2013-12-24 MED ORDER — ONDANSETRON HCL 4 MG PO TABS: 4.0000 mg | ORAL_TABLET | Freq: Four times a day (QID) | ORAL | Status: DC

## 2013-12-24 MED ORDER — DEXAMETHASONE SODIUM PHOSPHATE 4 MG/ML IJ SOLN
10.0000 mg | Freq: Once | INTRAMUSCULAR | Status: AC
  Administered 2013-12-24: 10 mg via INTRAVENOUS
  Filled 2013-12-24: qty 3

## 2013-12-24 MED ORDER — DIPHENHYDRAMINE HCL 50 MG/ML IJ SOLN
25.0000 mg | Freq: Once | INTRAMUSCULAR | Status: AC
  Administered 2013-12-24: 25 mg via INTRAVENOUS
  Filled 2013-12-24: qty 1

## 2013-12-24 MED ORDER — ONDANSETRON HCL 4 MG/2ML IJ SOLN
4.0000 mg | Freq: Once | INTRAMUSCULAR | Status: AC
  Administered 2013-12-24: 4 mg via INTRAVENOUS
  Filled 2013-12-24: qty 2

## 2013-12-24 MED ORDER — HYDROCODONE-ACETAMINOPHEN 5-325 MG PO TABS
1.0000 | ORAL_TABLET | Freq: Once | ORAL | Status: AC
  Administered 2013-12-24: 1 via ORAL
  Filled 2013-12-24: qty 1

## 2013-12-24 MED ORDER — SODIUM CHLORIDE 0.9 % IV BOLUS (SEPSIS)
1000.0000 mL | Freq: Once | INTRAVENOUS | Status: AC
  Administered 2013-12-24: 1000 mL via INTRAVENOUS

## 2013-12-24 MED ORDER — ONDANSETRON 4 MG PO TBDP
4.0000 mg | ORAL_TABLET | Freq: Once | ORAL | Status: AC
  Administered 2013-12-24: 4 mg via ORAL
  Filled 2013-12-24: qty 1

## 2013-12-24 MED ORDER — IBUPROFEN 800 MG PO TABS: 800.0000 mg | ORAL_TABLET | Freq: Three times a day (TID) | ORAL | Status: DC

## 2013-12-24 NOTE — ED Provider Notes (Signed)
CSN: [id number]     Arrival date & time 12/24/13  1307 History  This chart was scribed for Glynn Octave, MD by Bennett Scrape, ED Scribe. This patient was seen in room APA17/AP[facility]17 and the patient's care was started at 2:20 PM.     Chief Complaint  Patient presents with  . Headache     The history is provided by the patient. No language interpreter was used.    HPI Comments: Marisa Johnson is a 38 y.o. female who presents to the Emergency Department complaining of one week a rapid onset HA located in the right frontal region described as nagging and constant. The HA is worsened by loud noises and going from a sitting to a standing position. She reports associated mild neck stiffness and pain, lightheadedness, mild blurry vision, feeling off-balance and mild intermittent confusion. She describes the feeling off-balance as walking to the right. Pt states that she currently feels very fatigued. Pt and family state that the pt is at her normal mentation baseline currently. She has a h/o frequent HAs and has a prescription for Replax. She states that she has tried taking Replax along with OTC pain relievers with no improvement.  She states that this HA is different because it is more "nagging" and radiates around top of the head.  She reports that she called Dr. Ouida Sills last week and was given a prescription for a prednisone taper. She states that she started taking it 4 days ago with no improvement. She states that she has been eating and drinking at her normal baseline which is one stool per week and urinating once a day. She denies any fevers, emesis, CP, abdominal pain and urinary symptoms. She has a h/o hypothyroidism with a recent normal TSH level.     Past Medical History  Diagnosis Date  . PTSD (post-traumatic stress disorder)   . Thyroid disease   . Anxiety   . Bipolar disorder   . Major depressive disorder   IBS per pt  Past Surgical History  Procedure Laterality Date  . None      Family History  Problem Relation Age of Onset  . Colon cancer Neg Hx   . Diabetes Mother   . Hypertension Mother   . Hypertension Father   . Stroke Father   . Hyperlipidemia Maternal Grandmother   . Kidney disease Paternal Grandmother   . Hypertension Paternal Grandmother    History  Substance Use Topics  . Smoking status: Never Smoker   . Smokeless tobacco: Not on file     Comment: Never smoker  . Alcohol Use: No     Comment: occasionally   No OB history provided.  Review of Systems  A complete 10 system review of systems was obtained and all systems are negative except as noted in the HPI and PMH.    Allergies  Abilify and Reglan  Home Medications   Current Outpatient Rx  Name  Route  Sig  Dispense  Refill  . buPROPion (WELLBUTRIN XL) 150 MG 24 hr tablet   Oral   Take 150 mg by mouth daily.         . clonazePAM (KLONOPIN) 0.5 MG tablet   Oral   Take 1 mg by mouth at bedtime as needed for anxiety.         Marland Kitchen desogestrel-ethinyl estradiol (KARIVA,AZURETTE,MIRCETTE) 0.15-0.02/0.01 MG (21/5) tablet   Oral   Take 1 tablet by mouth daily.   1 Package   11   . eletriptan (  RELPAX) 40 MG tablet   Oral   Take 40 mg by mouth every 2 (two) hours as needed for migraine or headache. One tablet by mouth at onset of headache. May repeat in 2 hours if headache persists or recurs.         . lamoTRIgine (LAMICTAL) 200 MG tablet   Oral   Take 200 mg by mouth at bedtime.         Marland Kitchen levothyroxine (SYNTHROID, LEVOTHROID) 100 MCG tablet   Oral   Take 100 mcg by mouth daily.          . Linaclotide (LINZESS) 290 MCG CAPS capsule   Oral   Take 1 capsule (290 mcg total) by mouth daily. Take 30 minutes before breakfast.   30 capsule   11   . Lurasidone HCl (LATUDA) 20 MG TABS   Oral   Take 20 mg by mouth daily.         . ondansetron (ZOFRAN-ODT) 8 MG disintegrating tablet   Oral   Take 8 mg by mouth every 6 (six) hours as needed for nausea or vomiting.          . predniSONE (DELTASONE) 10 MG tablet   Oral   Take 10 mg by mouth daily. 4 tabs x2 days;3 tabs x2 days;2 tabs x2 days; 1 tab x2 days; 1/2 tab x2 days         . traZODone (DESYREL) 50 MG tablet   Oral   Take 50 mg by mouth at bedtime. Pt takes 2 tablets at bedtime         . ibuprofen (ADVIL,MOTRIN) 800 MG tablet   Oral   Take 1 tablet (800 mg total) by mouth 3 (three) times daily.   21 tablet   0   . ondansetron (ZOFRAN) 4 MG tablet   Oral   Take 1 tablet (4 mg total) by mouth every 6 (six) hours.   12 tablet   0    Triage vitals: BP 111/82  Pulse 68  Temp(Src) 98.3 F (36.8 C) (Oral)  Resp 20  Ht 5\' 3"  (1.6 m)  Wt 145 lb (65.772 kg)  BMI 25.69 kg/m2  SpO2 100%  LMP 12/10/2013  Physical Exam  Nursing note and vitals reviewed. Constitutional: She is oriented to person, place, and time. She appears well-developed and well-nourished. No distress.  HENT:  Head: Normocephalic and atraumatic.  Mouth/Throat: Oropharynx is clear and moist. No oropharyngeal exudate.  No temporal artery tenderness  Eyes: Conjunctivae and EOM are normal. Pupils are equal, round, and reactive to light.  Neck: Neck supple. No tracheal deviation present.  No meningismus   Cardiovascular: Normal rate, regular rhythm, normal heart sounds and intact distal pulses.   No murmur heard. Pulmonary/Chest: Effort normal and breath sounds normal. No respiratory distress.  Musculoskeletal: Normal range of motion. She exhibits no edema.  Intact peripheral pulses, no peripheral edema  Neurological: She is alert and oriented to person, place, and time. No cranial nerve deficit. She exhibits normal muscle tone. Coordination normal.  CN 2-12 intact, no ataxia on finger to nose, no nystagmus, 5/5 strength throughout, no pronator drift, Romberg negative, gait normal  Skin: Skin is warm and dry.  Psychiatric: She has a normal mood and affect. Her behavior is normal.    ED Course  Procedures (including  critical care time)  Medications  sodium chloride 0.9 % bolus 1,000 mL (0 mLs Intravenous Stopped 12/24/13 1652)  ondansetron (ZOFRAN) injection 4 mg (4 mg Intravenous  Given 12/24/13 1501)  ketorolac (TORADOL) 30 MG/ML injection 30 mg (30 mg Intravenous Given 12/24/13 1501)  diphenhydrAMINE (BENADRYL) injection 25 mg (25 mg Intravenous Given 12/24/13 1501)  dexamethasone (DECADRON) injection 10 mg (10 mg Intravenous Given 12/24/13 1637)  sodium chloride 0.9 % bolus 1,000 mL (0 mLs Intravenous Stopped 12/24/13 1741)  diphenhydrAMINE (BENADRYL) injection 25 mg (25 mg Intravenous Given 12/24/13 1651)  ondansetron (ZOFRAN-ODT) disintegrating tablet 4 mg (4 mg Oral Given 12/24/13 2043)  HYDROcodone-acetaminophen (NORCO/VICODIN) 5-325 MG per tablet 1 tablet (1 tablet Oral Given 12/24/13 2043)    DIAGNOSTIC STUDIES: Oxygen Saturation is 100% on RA, normal by my interpretation.    COORDINATION OF CARE: 2:30 PM-Discussed treatment plan which includes IV fluids, CT of head, CBC and CMP with pt at bedside and pt agreed to plan.   Labs Review Labs Reviewed  CBC WITH DIFFERENTIAL - Abnormal; Notable for the following:    Neutrophils Relative % 38 (*)    Lymphocytes Relative 54 (*)    All other components within normal limits  COMPREHENSIVE METABOLIC PANEL - Abnormal; Notable for the following:    Potassium 3.6 (*)    GFR calc non Af Amer 85 (*)    All other components within normal limits  URINALYSIS, ROUTINE W REFLEX MICROSCOPIC - Abnormal; Notable for the following:    Specific Gravity, Urine >1.030 (*)    Hgb urine dipstick MODERATE (*)    Protein, ur TRACE (*)    All other components within normal limits  URINE MICROSCOPIC-ADD ON - Abnormal; Notable for the following:    Squamous Epithelial / LPF FEW (*)    Bacteria, UA FEW (*)    All other components within normal limits  PREGNANCY, URINE  D-DIMER, QUANTITATIVE   Imaging Review Dg Chest 2 View  12/24/2013   CLINICAL DATA:  Chest pain and  dizziness  EXAM: CHEST  2 VIEW  COMPARISON:  None.  FINDINGS: The lungs are well-expanded and clear. The cardiac silhouette is normal in size. The mediastinum is normal in width. There is no pleural effusion or pneumothorax or pneumomediastinum. The observed portions of the bony thorax appear normal.  IMPRESSION: There is no evidence of active cardiopulmonary disease.   Electronically Signed   By: David  [place]   On: 12/24/2013 17:56   Ct Head Wo Contrast  12/24/2013   CLINICAL DATA:  Headache  EXAM: CT HEAD WITHOUT CONTRAST  TECHNIQUE: Contiguous axial images were obtained from the base of the skull through the vertex without contrast.  COMPARISON:  12/02/2010  FINDINGS: Normal appearance of the intracranial structures. No evidence for acute hemorrhage, mass lesion, midline shift, hydrocephalus or large infarct. No acute bony abnormality. The visualized sinuses are clear.  IMPRESSION: No acute intracranial abnormality.   Electronically Signed   By: Ruel Favors M.D.   On: 12/24/2013 15:47   Mr Brain Wo Contrast  12/24/2013   CLINICAL DATA:  Dizziness and right frontal headaches. Difficulty with ambulation.  EXAM: MRI HEAD WITHOUT CONTRAST  TECHNIQUE: Multiplanar, multiecho pulse sequences of the brain and surrounding structures were obtained without intravenous contrast.  COMPARISON:  CT head without contrast from the same day.  FINDINGS: No acute infarct, hemorrhage, or mass lesion is present. The ventricles are of normal size. No significant white matter changes are evident. No significant extraaxial fluid collection is present.  Flow is present in the major intracranial arteries. The globes and orbits are intact. The paranasal sinuses and mastoid air cells are clear.  IMPRESSION: 1.  Negative MRI of the brain.   Electronically Signed   By: Gennette Pachris  Mattern M.D.   On: 12/24/2013 20:23   Mr Mrv Head Wo Cm  12/24/2013   CLINICAL DATA:  Dizziness and headache.  Unsteady gait.  EXAM: MR MRV HEAD WITHOUT  CONTRAST  TECHNIQUE: Multiplanar, multisequence MR imaging was performed. No intravenous contrast was administered.  COMPARISON:  MRI brain from the same day.  FINDINGS: The dural sinuses demonstrate normal flow. The right transverse sinus is dominant. The internal jugular veins are patent bilaterally. The cortical veins are patent. The straight sinus and deep cerebral veins are patent.  IMPRESSION: Negative MR venogram   Electronically Signed   By: Gennette Pachris  Mattern M.D.   On: 12/24/2013 20:25     EKG Interpretation None      MDM   Final diagnoses:  Headache   gradual onset headache for the past week associated with nausea, generalized weakness, lightheadedness worse with position change. No focal weakness, numbness or tingling. No fever.  Nonfocal neuro exam. No meningismus. Normal mental status, normal orientation.  Neuro exam is normal.  Negative rhomberg and normal gait.   IVF, antiemetics, toradol, benadryl given. CT negative. Labs unremarkable WBC normal.  Afebrile.  Suspect migrainous headache causing symptoms.  History not consistent with SAH.  Low suspicion for meningitis.  No fever, no meningismus, no neuro deficits, no leukocytosis.  D/w patient option of proceeding with LP.  She has had symptoms x 10 days and appears nontoxic and well. Discussed that if she had bacterial meningitis, she would appear much more ill.   Viral meningitis is a remote possibility but would not change treatment. Patient agrees and wishes not to pursue LP at this time.  In Xray, patient reports felt very dizzy and fell over to the R.  Neuro exam remains unchanged, patient able to ambulate without assistance. She is on birth control, so sinus thrombosis is considered. MRI and MRV are negative.  Patient is ambulatory and tolerating PO. She appears stable for outpatient followup with neurology. She is instructed to return with fever, worsening headache, confusion, behavior change, focal weakness, numbness,  tingling or any other concerns.   I personally performed the services described in this documentation, which was scribed in my presence. The recorded information has been reviewed and is accurate.      Glynn OctaveStephen Shaina Gullatt, MD 12/24/13 2252

## 2013-12-24 NOTE — ED Notes (Signed)
Patient walked to restroom without any assistance. 

## 2013-12-24 NOTE — ED Notes (Signed)
Pt has itching in lower back and groin area after decadron was given.  EDP notified and new orders received

## 2013-12-24 NOTE — Discharge Instructions (Signed)
Migraine Headache There is no evidence of meningitis, blood clot, or stroke. Follow up with Dr. Fagan and Dr. Gerilyn Pilgrimoonquah. Return to the ED iOuida Sillsf you develop new or worsening symptoms. A migraine headache is an intense, throbbing pain on one or both sides of your head. A migraine can last for 30 minutes to several hours. CAUSES  The exact cause of a migraine headache is not always known. However, a migraine may be caused when nerves in the brain become irritated and release chemicals that cause inflammation. This causes pain. Certain things may also trigger migraines, such as:  Alcohol.  Smoking.  Stress.  Menstruation.  Aged cheeses.  Foods or drinks that contain nitrates, glutamate, aspartame, or tyramine.  Lack of sleep.  Chocolate.  Caffeine.  Hunger.  Physical exertion.  Fatigue.  Medicines used to treat chest pain (nitroglycerine), birth control pills, estrogen, and some blood pressure medicines. SIGNS AND SYMPTOMS  Pain on one or both sides of your head.  Pulsating or throbbing pain.  Severe pain that prevents daily activities.  Pain that is aggravated by any physical activity.  Nausea, vomiting, or both.  Dizziness.  Pain with exposure to bright lights, loud noises, or activity.  General sensitivity to bright lights, loud noises, or smells. Before you get a migraine, you may get warning signs that a migraine is coming (aura). An aura may include:  Seeing flashing lights.  Seeing bright spots, halos, or zig-zag lines.  Having tunnel vision or blurred vision.  Having feelings of numbness or tingling.  Having trouble talking.  Having muscle weakness. DIAGNOSIS  A migraine headache is often diagnosed based on:  Symptoms.  Physical exam.  A CT scan or MRI of your head. These imaging tests cannot diagnose migraines, but they can help rule out other causes of headaches. TREATMENT Medicines may be given for pain and nausea. Medicines can also be given  to help prevent recurrent migraines.  HOME CARE INSTRUCTIONS  Only take over-the-counter or prescription medicines for pain or discomfort as directed by your health care provider. The use of long-term narcotics is not recommended.  Lie down in a dark, quiet room when you have a migraine.  Keep a journal to find out what may trigger your migraine headaches. For example, write down:  What you eat and drink.  How much sleep you get.  Any change to your diet or medicines.  Limit alcohol consumption.  Quit smoking if you smoke.  Get 7 9 hours of sleep, or as recommended by your health care provider.  Limit stress.  Keep lights dim if bright lights bother you and make your migraines worse. SEEK IMMEDIATE MEDICAL CARE IF:   Your migraine becomes severe.  You have a fever.  You have a stiff neck.  You have vision loss.  You have muscular weakness or loss of muscle control.  You start losing your balance or have trouble walking.  You feel faint or pass out.  You have severe symptoms that are different from your first symptoms. MAKE SURE YOU:   Understand these instructions.  Will watch your condition.  Will get help right away if you are not doing well or get worse. Document Released: 09/30/2005 Document Revised: 07/21/2013 Document Reviewed: 06/07/2013 [facility] Center[facility] Patient Information 2014 [place]ExitCare, [place].

## 2013-12-24 NOTE — ED Notes (Addendum)
Headache, neck pain, confusion. Dizzy feeling, feels "off balance"  Feels like "electrical shocks"  In my head.  Sx for 11 days

## 2013-12-24 NOTE — ED Notes (Signed)
Pt reports when stood for x ray was very dizzy and head is "throbbing."  Dr. Manus Gunningancour aware and is in room with pt.

## 2014-06-05 ENCOUNTER — Encounter (HOSPITAL_COMMUNITY): Payer: Self-pay | Admitting: Emergency Medicine

## 2014-06-05 ENCOUNTER — Emergency Department (HOSPITAL_COMMUNITY)
Admission: EM | Admit: 2014-06-05 | Discharge: 2014-06-05 | Disposition: A | Discharge status: Home / Self Care | Payer: PRIVATE HEALTH INSURANCE | Attending: Emergency Medicine | Admitting: Emergency Medicine

## 2014-06-05 ENCOUNTER — Emergency Department (HOSPITAL_COMMUNITY): Payer: PRIVATE HEALTH INSURANCE

## 2014-06-05 DIAGNOSIS — S5010XA Contusion of unspecified forearm, initial encounter: Secondary | ICD-10-CM | POA: Insufficient documentation

## 2014-06-05 DIAGNOSIS — Y990 Civilian activity done for income or pay: Secondary | ICD-10-CM | POA: Diagnosis not present

## 2014-06-05 DIAGNOSIS — F4310 Post-traumatic stress disorder, unspecified: Secondary | ICD-10-CM | POA: Insufficient documentation

## 2014-06-05 DIAGNOSIS — IMO0002 Reserved for concepts with insufficient information to code with codable children: Secondary | ICD-10-CM | POA: Insufficient documentation

## 2014-06-05 DIAGNOSIS — E079 Disorder of thyroid, unspecified: Secondary | ICD-10-CM | POA: Insufficient documentation

## 2014-06-05 DIAGNOSIS — S5011XA Contusion of right forearm, initial encounter: Secondary | ICD-10-CM

## 2014-06-05 DIAGNOSIS — F319 Bipolar disorder, unspecified: Secondary | ICD-10-CM | POA: Diagnosis not present

## 2014-06-05 DIAGNOSIS — S46909A Unspecified injury of unspecified muscle, fascia and tendon at shoulder and upper arm level, unspecified arm, initial encounter: Secondary | ICD-10-CM | POA: Insufficient documentation

## 2014-06-05 DIAGNOSIS — Y9389 Activity, other specified: Secondary | ICD-10-CM | POA: Insufficient documentation

## 2014-06-05 DIAGNOSIS — F411 Generalized anxiety disorder: Secondary | ICD-10-CM | POA: Diagnosis not present

## 2014-06-05 DIAGNOSIS — S4980XA Other specified injuries of shoulder and upper arm, unspecified arm, initial encounter: Secondary | ICD-10-CM | POA: Diagnosis present

## 2014-06-05 DIAGNOSIS — Y9289 Other specified places as the place of occurrence of the external cause: Secondary | ICD-10-CM | POA: Insufficient documentation

## 2014-06-05 DIAGNOSIS — Z791 Long term (current) use of non-steroidal anti-inflammatories (NSAID): Secondary | ICD-10-CM | POA: Insufficient documentation

## 2014-06-05 DIAGNOSIS — W2209XA Striking against other stationary object, initial encounter: Secondary | ICD-10-CM | POA: Diagnosis not present

## 2014-06-05 DIAGNOSIS — Z79899 Other long term (current) drug therapy: Secondary | ICD-10-CM | POA: Diagnosis not present

## 2014-06-05 NOTE — ED Provider Notes (Signed)
CSN: [id number]     Arrival date & time 06/05/14  4098 History   First MD Initiated Contact with Patient 06/05/14 0522     Chief Complaint  Patient presents with  . Arm Injury     (Consider location/radiation/quality/duration/timing/severity/associated sxs/prior Treatment) HPI Patient struck her right forearm on a door while at work. She's complaining of distal radius pain. Is also complaining of tingling in her fingers. No weakness. No obvious injury. Past Medical History  Diagnosis Date  . PTSD (post-traumatic stress disorder)   . Thyroid disease   . Anxiety   . Bipolar disorder   . Major depressive disorder    Past Surgical History  Procedure Laterality Date  . None     Family History  Problem Relation Age of Onset  . Colon cancer Neg Hx   . Diabetes Mother   . Hypertension Mother   . Hypertension Father   . Stroke Father   . Hyperlipidemia Maternal Grandmother   . Kidney disease Paternal Grandmother   . Hypertension Paternal Grandmother    History  Substance Use Topics  . Smoking status: Never Smoker   . Smokeless tobacco: Not on file     Comment: Never smoker  . Alcohol Use: No     Comment: occasionally   OB History   Grav Para Term Preterm Abortions TAB SAB Ect Mult Living                 Review of Systems  Musculoskeletal: Negative for arthralgias.  Skin: Negative for wound.  Neurological: Positive for numbness (tingling). Negative for weakness.  All other systems reviewed and are negative.     Allergies  Abilify and Reglan  Home Medications   Prior to Admission medications   Medication Sig Start Date End Date Taking? Authorizing Provider  buPROPion (WELLBUTRIN XL) 150 MG 24 hr tablet Take 150 mg by mouth daily.   Yes Historical Provider, MD  clonazePAM (KLONOPIN) 0.5 MG tablet Take 1 mg by mouth at bedtime as needed for anxiety.   Yes Historical Provider, MD  desogestrel-ethinyl estradiol (KARIVA,AZURETTE,MIRCETTE) 0.15-0.02/0.01 MG (21/5)  tablet Take 1 tablet by mouth daily. 09/21/13  Yes Lazaro Arms, MD  eletriptan (RELPAX) 40 MG tablet Take 40 mg by mouth every 2 (two) hours as needed for migraine or headache. One tablet by mouth at onset of headache. May repeat in 2 hours if headache persists or recurs.   Yes Historical Provider, MD  ibuprofen (ADVIL,MOTRIN) 800 MG tablet Take 1 tablet (800 mg total) by mouth 3 (three) times daily. 12/24/13  Yes Glynn Octave, MD  lamoTRIgine (LAMICTAL) 200 MG tablet Take 200 mg by mouth at bedtime.   Yes Historical Provider, MD  levothyroxine (SYNTHROID, LEVOTHROID) 100 MCG tablet Take 100 mcg by mouth daily.    Yes Historical Provider, MD  Linaclotide (LINZESS) 290 MCG CAPS capsule Take 1 capsule (290 mcg total) by mouth daily. Take 30 minutes before breakfast. 09/16/13  Yes Nira Retort, NP  Lurasidone HCl (LATUDA) 20 MG TABS Take 20 mg by mouth daily.   Yes Historical Provider, MD  ondansetron (ZOFRAN) 4 MG tablet Take 1 tablet (4 mg total) by mouth every 6 (six) hours. 12/24/13  Yes Glynn Octave, MD  ondansetron (ZOFRAN-ODT) 8 MG disintegrating tablet Take 8 mg by mouth every 6 (six) hours as needed for nausea or vomiting.   Yes Historical Provider, MD  traZODone (DESYREL) 50 MG tablet Take 50 mg by mouth at bedtime. Pt takes 2 tablets  at bedtime   Yes Historical Provider, MD  predniSONE (DELTASONE) 10 MG tablet Take 10 mg by mouth daily. 4 tabs x2 days;3 tabs x2 days;2 tabs x2 days; 1 tab x2 days; 1/2 tab x2 days    Historical Provider, MD   BP 109/83  Pulse 84  Temp(Src) 98.4 F (36.9 C) (Oral)  Resp 16  Ht 5' 3.5" (1.613 m)  Wt 167 lb (75.751 kg)  BMI 29.12 kg/m2  SpO2 100%  LMP 05/15/2014 Physical Exam  Nursing note and vitals reviewed. Constitutional: She is oriented to person, place, and time. She appears well-developed and well-nourished. No distress.  HENT:  Head: Normocephalic and atraumatic.  Mouth/Throat: Oropharynx is clear and moist.  Neck: Normal range of motion.  Neck supple.  Pulmonary/Chest: Effort normal.  Musculoskeletal: Normal range of motion. She exhibits no edema and no tenderness.  Tenderness palpation over the distal ulnar aspect of the right forearm. There is no obvious bony deformity. 2+ radial pulses. Good cap refill. Hand is warm. There is no obvious swelling. No snuffbox tenderness.   Neurological: She is alert and oriented to person, place, and time.  Paresthesias to the distal tips of all fingers. Good intrinsic motor movement of the hand.  Skin: Skin is warm and dry. No rash noted. No erythema.  Psychiatric: She has a normal mood and affect. Her behavior is normal.    ED Course  Procedures (including critical care time) Labs Review Labs Reviewed - No data to display  Imaging Review No results found.   EKG Interpretation None      MDM   Final diagnoses:  None    X-ray without a findings of fracture. Likely contusion of the distal forearm. Advise ice, elevation and NSAIDs for pain.    Loren Racer, MD 06/05/14 [phone]

## 2014-06-05 NOTE — Discharge Instructions (Signed)
Contusion °A contusion is a deep bruise. Contusions are the result of an injury that caused bleeding under the skin. The contusion may turn blue, purple, or yellow. Minor injuries will give you a painless contusion, but more severe contusions may stay painful and swollen for a few weeks.  °CAUSES  °A contusion is usually caused by a blow, trauma, or direct force to an area of the body. °SYMPTOMS  °· Swelling and redness of the injured area. °· Bruising of the injured area. °· Tenderness and soreness of the injured area. °· Pain. °DIAGNOSIS  °The diagnosis can be made by taking a history and physical exam. An X-ray, CT scan, or MRI may be needed to determine if there were any associated injuries, such as fractures. °TREATMENT  °Specific treatment will depend on what area of the body was injured. In general, the best treatment for a contusion is resting, icing, elevating, and applying cold compresses to the injured area. Over-the-counter medicines may also be recommended for pain control. Ask your caregiver what the best treatment is for your contusion. °HOME CARE INSTRUCTIONS  °· Put ice on the injured area. °¨ Put ice in a plastic bag. °¨ Place a towel between your skin and the bag. °¨ Leave the ice on for 15-20 minutes, 3-4 times a day, or as directed by your health care provider. °· Only take over-the-counter or prescription medicines for pain, discomfort, or fever as directed by your caregiver. Your caregiver may recommend avoiding anti-inflammatory medicines (aspirin, ibuprofen, and naproxen) for 48 hours because these medicines may increase bruising. °· Rest the injured area. °· If possible, elevate the injured area to reduce swelling. °SEEK IMMEDIATE MEDICAL CARE IF:  °· You have increased bruising or swelling. °· You have pain that is getting worse. °· Your swelling or pain is not relieved with medicines. °MAKE SURE YOU:  °· Understand these instructions. °· Will watch your condition. °· Will get help right  away if you are not doing well or get worse. °Document Released: 07/10/2005 Document Revised: 10/05/2013 Document Reviewed: 08/05/2011 °ExitCare® Patient Information ©2015 [employer]. This information is not intended to replace advice given to you by your health care provider. Make sure you discuss any questions you have with your health care provider. ° °

## 2014-06-05 NOTE — ED Notes (Addendum)
Pt injured arm when entering a room and the large glass door swung into her right forearm causing pain and tingling to forearm and fingers of right hand. Pt is also experiencing some pain to right upper back and into right side of neck with movement.

## 2014-07-07 ENCOUNTER — Other Ambulatory Visit: Payer: Self-pay | Admitting: Obstetrics & Gynecology

## 2014-07-15 ENCOUNTER — Institutional Professional Consult (permissible substitution): Payer: Self-pay | Admitting: Pulmonary Disease

## 2014-07-25 ENCOUNTER — Institutional Professional Consult (permissible substitution): Payer: Self-pay | Admitting: Pulmonary Disease

## 2014-12-10 ENCOUNTER — Emergency Department (HOSPITAL_COMMUNITY)
Admission: EM | Admit: 2014-12-10 | Discharge: 2014-12-10 | Disposition: A | Discharge status: Home / Self Care | Payer: 59 | Attending: Emergency Medicine | Admitting: Emergency Medicine

## 2014-12-10 ENCOUNTER — Encounter (HOSPITAL_COMMUNITY): Payer: Self-pay | Admitting: Emergency Medicine

## 2014-12-10 DIAGNOSIS — Z79899 Other long term (current) drug therapy: Secondary | ICD-10-CM | POA: Insufficient documentation

## 2014-12-10 DIAGNOSIS — E079 Disorder of thyroid, unspecified: Secondary | ICD-10-CM | POA: Diagnosis not present

## 2014-12-10 DIAGNOSIS — F319 Bipolar disorder, unspecified: Secondary | ICD-10-CM | POA: Insufficient documentation

## 2014-12-10 DIAGNOSIS — B029 Zoster without complications: Secondary | ICD-10-CM | POA: Insufficient documentation

## 2014-12-10 DIAGNOSIS — G43909 Migraine, unspecified, not intractable, without status migrainosus: Secondary | ICD-10-CM | POA: Diagnosis not present

## 2014-12-10 DIAGNOSIS — F419 Anxiety disorder, unspecified: Secondary | ICD-10-CM | POA: Diagnosis not present

## 2014-12-10 DIAGNOSIS — R21 Rash and other nonspecific skin eruption: Secondary | ICD-10-CM | POA: Diagnosis present

## 2014-12-10 HISTORY — DX: Migraine, unspecified, not intractable, without status migrainosus: G43.909

## 2014-12-10 HISTORY — DX: Carpal tunnel syndrome, unspecified upper limb: G56.00

## 2014-12-10 MED ORDER — ACYCLOVIR 800 MG PO TABS
800.0000 mg | ORAL_TABLET | Freq: Once | ORAL | Status: AC
  Administered 2014-12-10: 800 mg via ORAL
  Filled 2014-12-10: qty 1

## 2014-12-10 MED ORDER — HYDROCODONE-ACETAMINOPHEN 5-325 MG PO TABS: ORAL_TABLET | ORAL | Status: DC

## 2014-12-10 MED ORDER — ACYCLOVIR 800 MG PO TABS: 800.0000 mg | ORAL_TABLET | Freq: Every day | ORAL | Status: DC

## 2014-12-10 NOTE — Discharge Instructions (Signed)

## 2014-12-10 NOTE — ED Provider Notes (Signed)
CSN: [id number]     Arrival date & time 12/10/14  1447 History   First MD Initiated Contact with Patient 12/10/14 1519     Chief Complaint  Patient presents with  . Rash     (Consider location/radiation/quality/duration/timing/severity/associated sxs/prior Treatment) HPI   Marisa Johnson is a 39 y.o. female who presents to the Emergency Department complaining of painful rash to the right side of her neck and upper chest for four days.  She reports having generalized fatigue and shoulder pain last week that resolved after a few days.  She describes the rash as "little red bumps" that have progressed to "blisters."  She states that she was recently exposed to bed bugs and is concerned this rash is related.  She has tried OTC hydrocortisone cream and benadryl without relief.  She reports minimal itching. She denies fever, new medications, numbness or weakness.   Past Medical History  Diagnosis Date  . PTSD (post-traumatic stress disorder)   . Thyroid disease   . Anxiety   . Bipolar disorder   . Major depressive disorder   . Migraine   . Carpal tunnel syndrome    Past Surgical History  Procedure Laterality Date  . None     Family History  Problem Relation Age of Onset  . Colon cancer Neg Hx   . Diabetes Mother   . Hypertension Mother   . Hypertension Father   . Stroke Father   . Hyperlipidemia Maternal Grandmother   . Kidney disease Paternal Grandmother   . Hypertension Paternal Grandmother    History  Substance Use Topics  . Smoking status: Never Smoker   . Smokeless tobacco: Never Used     Comment: Never smoker  . Alcohol Use: No     Comment: occasionally   OB History    Gravida Para Term Preterm AB TAB SAB Ectopic Multiple Living   Review of Systems  Constitutional: Negative for fever, chills, activity change and appetite change.  HENT: Negative for facial swelling, sore throat and trouble swallowing.   Respiratory: Negative for chest  tightness, shortness of breath and wheezing.   Gastrointestinal: Negative for nausea, vomiting and abdominal pain.  Musculoskeletal: Negative for arthralgias, neck pain and neck stiffness.  Skin: Positive for rash. Negative for wound.  Neurological: Positive for headaches. Negative for dizziness, weakness and numbness.  All other systems reviewed and are negative.     Allergies  Abilify; Reglan; and Dexamethasone  Home Medications   Prior to Admission medications   Medication Sig Start Date End Date Taking? Authorizing Provider  acyclovir (ZOVIRAX) 800 MG tablet Take 1 tablet (800 mg total) by mouth 5 (five) times daily. For 10 days 12/10/14   Ramah Langhans L. Destine Zirkle, PA-C  buPROPion (WELLBUTRIN XL) 150 MG 24 hr tablet Take 150 mg by mouth daily.    Historical Provider, MD  clonazePAM (KLONOPIN) 0.5 MG tablet Take 1 mg by mouth at bedtime as needed for anxiety.    Historical Provider, MD  eletriptan (RELPAX) 40 MG tablet Take 40 mg by mouth every 2 (two) hours as needed for migraine or headache. One tablet by mouth at onset of headache. May repeat in 2 hours if headache persists or recurs.    Historical Provider, MD  HYDROcodone-acetaminophen (NORCO/VICODIN) 5-325 MG per tablet Take one-two tabs po q 4-6 hrs prn pain 12/10/14   Silveria Botz L. Jacqualin Shirkey, PA-C  ibuprofen (ADVIL,MOTRIN) 800 MG tablet Take  1 tablet (800 mg total) by mouth 3 (three) times daily. 12/24/13   Glynn OctaveStephen Rancour, MD  KARIVA 0.15-0.02/0.01 MG (21/5) tablet TAKE 1 TABLET BY MOUTH DAILY. 07/08/14   Lazaro ArmsLuther H Eure, MD  lamoTRIgine (LAMICTAL) 200 MG tablet Take 200 mg by mouth at bedtime.    Historical Provider, MD  levothyroxine (SYNTHROID, LEVOTHROID) 100 MCG tablet Take 100 mcg by mouth daily.     Historical Provider, MD  Linaclotide Karlene Einstein(LINZESS) 290 MCG CAPS capsule Take 1 capsule (290 mcg total) by mouth daily. Take 30 minutes before breakfast. 09/16/13   Nira RetortAnna W Sams, NP  Lurasidone HCl (LATUDA) 20 MG TABS Take 20 mg by mouth daily.     Historical Provider, MD  ondansetron (ZOFRAN) 4 MG tablet Take 1 tablet (4 mg total) by mouth every 6 (six) hours. 12/24/13   Glynn OctaveStephen Rancour, MD  ondansetron (ZOFRAN-ODT) 8 MG disintegrating tablet Take 8 mg by mouth every 6 (six) hours as needed for nausea or vomiting.    Historical Provider, MD  predniSONE (DELTASONE) 10 MG tablet Take 10 mg by mouth daily. 4 tabs x2 days;3 tabs x2 days;2 tabs x2 days; 1 tab x2 days; 1/2 tab x2 days    Historical Provider, MD  traZODone (DESYREL) 50 MG tablet Take 50 mg by mouth at bedtime. Pt takes 2 tablets at bedtime    Historical Provider, MD   BP 118/74 mmHg  Pulse 84  Temp(Src) 98 F (36.7 C) (Oral)  Resp 17  Ht 5\' 3"  (1.6 m)  Wt 168 lb (76.204 kg)  BMI 29.77 kg/m2  SpO2 100%  LMP 12/10/2014 Physical Exam  Constitutional: She is oriented to person, place, and time. She appears well-developed and well-nourished. No distress.  HENT:  Head: Normocephalic and atraumatic.  Mouth/Throat: Oropharynx is clear and moist.  Neck: Normal range of motion. Neck supple.  Cardiovascular: Normal rate, regular rhythm, normal heart sounds and intact distal pulses.   No murmur heard. Pulmonary/Chest: Effort normal and breath sounds normal. No respiratory distress.  Musculoskeletal: She exhibits no edema or tenderness.  Lymphadenopathy:    She has no cervical adenopathy.  Neurological: She is alert and oriented to person, place, and time. She exhibits normal muscle tone. Coordination normal.  Skin: Skin is warm. Rash noted. There is erythema.  Grouped Erythematous papules and vesicles to the right neck, shoulder and upper chest  Nursing note and vitals reviewed.   ED Course  Procedures (including critical care time) Labs Review Labs Reviewed - No data to display  Imaging Review No results found.   EKG Interpretation None      MDM   Final diagnoses:  Herpes zoster    Patient also seen by Dr. Adriana Simasook.  Care plan discussed.  Rash appears c/w herpes  zoster.  Patient is otherwise well appearing and vitals stable. Agrees to acyclovir and close f/u with her PMD later this week.    Shivali Quackenbush L. Trisha Mangleriplett, PA-C 12/11/14 1956  Donnetta HutchingBrian Cook, MD 12/14/14 [phone]

## 2014-12-10 NOTE — ED Notes (Signed)
Pt verbalized understanding of no driving and to use caution within 4 hours of taking pain meds due to meds cause drowsiness 

## 2014-12-10 NOTE — ED Notes (Addendum)
Patient c/o rash to back neck and chest since Tuesday. Patient also c/o headache that started this morning. Per patient has had several headaches this week on right side.

## 2014-12-12 MED FILL — Hydrocodone-Acetaminophen Tab 5-325 MG: ORAL | Qty: 6 | Status: AC

## 2015-04-21 ENCOUNTER — Encounter: Payer: Self-pay | Admitting: Obstetrics and Gynecology

## 2015-04-21 ENCOUNTER — Ambulatory Visit (INDEPENDENT_AMBULATORY_CARE_PROVIDER_SITE_OTHER): Payer: 59 | Admitting: Obstetrics and Gynecology

## 2015-04-21 VITALS — BP 110/76 | Ht 63.0 in | Wt 169.0 lb

## 2015-04-21 DIAGNOSIS — N898 Other specified noninflammatory disorders of vagina: Secondary | ICD-10-CM | POA: Diagnosis not present

## 2015-04-21 LAB — POCT WET PREP WITH KOH
KOH Prep POC: NEGATIVE
Trichomonas, UA: NEGATIVE

## 2015-04-21 MED ORDER — NYSTATIN-TRIAMCINOLONE 100000-0.1 UNIT/GM-% EX OINT: 1.0000 "application " | TOPICAL_OINTMENT | Freq: Two times a day (BID) | CUTANEOUS | Status: DC

## 2015-04-21 MED ORDER — METRONIDAZOLE 500 MG PO TABS: 500.0000 mg | ORAL_TABLET | Freq: Two times a day (BID) | ORAL | Status: DC

## 2015-04-21 MED ORDER — CEPHALEXIN 500 MG PO CAPS: 500.0000 mg | ORAL_CAPSULE | Freq: Three times a day (TID) | ORAL | Status: DC

## 2015-04-21 NOTE — Progress Notes (Addendum)
Patient ID: Marisa Johnson, female   DOB: 1976-06-14, 39 y.o.   MRN: [id number]    [facility] Visit  Patient name: Marisa Johnson MRN [id number]  Date of birth: 1976-05-13  CC & HPI:  Marisa Johnson is a 39 y.o. female presenting today for intermittent, mild vaginal itching and burning pain that started 6 days ago. She notes a diffuse rash on her bilateral legs and mild lower back pain as associated symptoms. Pt reports that she was sitting in a pool for long periods over 2 days before symptoms started. She was wearing a tampon for breakthrough bleeding and only changed it one time daily when at the pool. Pt expects to start menses next week. She has had 1 new sexual partner in the last 6 months.She is back with her longer term partner  ROS:  A complete 10 system review of systems was obtained and all systems are negative except as noted in the HPI and PMH.   Pertinent History Reviewed:   Reviewed. Medical         Past Medical History  Diagnosis Date  . PTSD (post-traumatic stress disorder)   . Thyroid disease   . Anxiety   . Bipolar disorder   . Major depressive disorder   . Migraine   . Carpal tunnel syndrome                               Surgical Hx:    Past Surgical History  Procedure Laterality Date  . None     Medications: Reviewed & Updated - see associated section                       Current outpatient prescriptions:  .  buPROPion (WELLBUTRIN XL) 150 MG 24 hr tablet, Take 150 mg by mouth daily., Disp: , Rfl:  .  clonazePAM (KLONOPIN) 0.5 MG tablet, Take 1 mg by mouth at bedtime as needed for anxiety., Disp: , Rfl:  .  KARIVA 0.15-0.02/0.01 MG (21/5) tablet, TAKE 1 TABLET BY MOUTH DAILY., Disp: 28 tablet, Rfl: 11 .  lamoTRIgine (LAMICTAL) 200 MG tablet, Take 200 mg by mouth at bedtime., Disp: , Rfl:  .  levothyroxine (SYNTHROID, LEVOTHROID) 100 MCG tablet, Take 112 mcg by mouth daily. , Disp: , Rfl:  .  Linaclotide (LINZESS) 290 MCG CAPS capsule, Take 1  capsule (290 mcg total) by mouth daily. Take 30 minutes before breakfast., Disp: 30 capsule, Rfl: 11 .  traZODone (DESYREL) 50 MG tablet, Take 50 mg by mouth at bedtime. Pt takes 2 tablets at bedtime, Disp: , Rfl:  .  ondansetron (ZOFRAN-ODT) 8 MG disintegrating tablet, Take 8 mg by mouth every 6 (six) hours as needed for nausea or vomiting., Disp: , Rfl:    Social History: Reviewed -  reports that she has never smoked. She has never used smokeless tobacco.  Objective Findings:  Vitals: Blood pressure 110/76, height  (1.6 m), weight 169 lb (76.658 kg), last menstrual period 03/25/2015.  Physical Examination: General appearance - alert, well appearing, and in no distress and oriented to person, place, and time Mental status - alert, oriented to person, place, and time, normal mood, behavior, speech, dress, motor activity, and thought processes Pelvic - normal external genitalia, vulva, vagina, cervix, uterus and adnexa VULVA: normal appearing vulva with no masses, tenderness or lesions VAGINA: normal appearing vagina with normal color and discharge, no  lesions CERVIX: normal appearing cervix without discharge or lesions; cervix is moderately irritated; yellow mucoid discharge UTERUS: uterus is normal size, shape, consistency and nontender ADNEXA: normal adnexa in size, nontender and no masses Extremities - Diffuse, nonspecific rash on bilateral thighs and LE  Assessment & Plan:   A:  1. Cervix irritated with mucoid discharge  =BV 2. Vulvar irritation from d/c 3BV 4.R/o STI 5. tonsillitis P:  1. GC/Chlamydia testing 2. Prescribe Mycolog for inflammation 3 Rx metronidazole po 4. Rx Keflex x 7d I personally performed the services described in this documentation, which was SCRIBED in my presence. The recorded information has been reviewed and considered accurate. It has been edited as necessary during review. Tilda BurrowFERGUSON,Saagar Tortorella V, MD     This chart was scribed for Tilda BurrowJohn Javia Dillow V, MD  by Gwenyth Oberatherine Macek, ED Scribe. This patient was seen in room 1 and the patient's care was started at 10:04 AM.

## 2015-04-21 NOTE — Addendum Note (Signed)
Addended by: Tilda BurrowFERGUSON, Cristi Gwynn V on: 04/21/2015 10:24 AM   Modules accepted: Orders

## 2015-04-25 LAB — GC/CHLAMYDIA PROBE AMP
CHLAMYDIA, DNA PROBE: NEGATIVE
NEISSERIA GONORRHOEAE BY PCR: NEGATIVE

## 2015-05-14 NOTE — Progress Notes (Signed)
REVIEWED-NO ADDITIONAL RECOMMENDATIONS. 

## 2015-07-12 ENCOUNTER — Emergency Department (HOSPITAL_COMMUNITY)
Admission: EM | Admit: 2015-07-12 | Discharge: 2015-07-12 | Disposition: A | Discharge status: Home / Self Care | Payer: 59 | Attending: Emergency Medicine | Admitting: Emergency Medicine

## 2015-07-12 ENCOUNTER — Encounter (HOSPITAL_COMMUNITY): Payer: Self-pay

## 2015-07-12 ENCOUNTER — Other Ambulatory Visit: Payer: 59 | Admitting: Adult Health

## 2015-07-12 DIAGNOSIS — G43909 Migraine, unspecified, not intractable, without status migrainosus: Secondary | ICD-10-CM | POA: Insufficient documentation

## 2015-07-12 DIAGNOSIS — F4310 Post-traumatic stress disorder, unspecified: Secondary | ICD-10-CM | POA: Diagnosis not present

## 2015-07-12 DIAGNOSIS — H81399 Other peripheral vertigo, unspecified ear: Secondary | ICD-10-CM | POA: Diagnosis not present

## 2015-07-12 DIAGNOSIS — E876 Hypokalemia: Secondary | ICD-10-CM | POA: Insufficient documentation

## 2015-07-12 DIAGNOSIS — N390 Urinary tract infection, site not specified: Secondary | ICD-10-CM | POA: Diagnosis not present

## 2015-07-12 DIAGNOSIS — Z792 Long term (current) use of antibiotics: Secondary | ICD-10-CM | POA: Insufficient documentation

## 2015-07-12 DIAGNOSIS — F319 Bipolar disorder, unspecified: Secondary | ICD-10-CM | POA: Insufficient documentation

## 2015-07-12 DIAGNOSIS — R42 Dizziness and giddiness: Secondary | ICD-10-CM | POA: Diagnosis present

## 2015-07-12 DIAGNOSIS — E079 Disorder of thyroid, unspecified: Secondary | ICD-10-CM | POA: Insufficient documentation

## 2015-07-12 DIAGNOSIS — F419 Anxiety disorder, unspecified: Secondary | ICD-10-CM | POA: Insufficient documentation

## 2015-07-12 DIAGNOSIS — Z79899 Other long term (current) drug therapy: Secondary | ICD-10-CM | POA: Insufficient documentation

## 2015-07-12 LAB — URINE MICROSCOPIC-ADD ON

## 2015-07-12 LAB — CBC WITH DIFFERENTIAL/PLATELET
BASOS ABS: 0 10*3/uL (ref 0.0–0.1)
BASOS PCT: 1 %
EOS PCT: 3 %
Eosinophils Absolute: 0.1 10*3/uL (ref 0.0–0.7)
HEMATOCRIT: 38 % (ref 36.0–46.0)
Hemoglobin: 12.7 g/dL (ref 12.0–15.0)
LYMPHS PCT: 51 %
Lymphs Abs: 2.6 10*3/uL (ref 0.7–4.0)
MCH: 30.1 pg (ref 26.0–34.0)
MCHC: 33.4 g/dL (ref 30.0–36.0)
MCV: 90 fL (ref 78.0–100.0)
Monocytes Absolute: 0.6 10*3/uL (ref 0.1–1.0)
Monocytes Relative: 11 %
NEUTROS ABS: 1.7 10*3/uL (ref 1.7–7.7)
Neutrophils Relative %: 34 %
PLATELETS: 214 10*3/uL (ref 150–400)
RBC: 4.22 MIL/uL (ref 3.87–5.11)
RDW: 12.5 % (ref 11.5–15.5)
WBC: 5 10*3/uL (ref 4.0–10.5)

## 2015-07-12 LAB — URINALYSIS, ROUTINE W REFLEX MICROSCOPIC
Bilirubin Urine: NEGATIVE
Glucose, UA: NEGATIVE mg/dL
KETONES UR: NEGATIVE mg/dL
LEUKOCYTES UA: NEGATIVE
Nitrite: NEGATIVE
PROTEIN: NEGATIVE mg/dL
Specific Gravity, Urine: 1.025 (ref 1.005–1.030)
UROBILINOGEN UA: 0.2 mg/dL (ref 0.0–1.0)
pH: 6 (ref 5.0–8.0)

## 2015-07-12 LAB — BASIC METABOLIC PANEL
ANION GAP: 6 (ref 5–15)
BUN: 9 mg/dL (ref 6–20)
CALCIUM: 8.2 mg/dL — AB (ref 8.9–10.3)
CO2: 22 mmol/L (ref 22–32)
Chloride: 109 mmol/L (ref 101–111)
Creatinine, Ser: 0.99 mg/dL (ref 0.44–1.00)
GLUCOSE: 87 mg/dL (ref 65–99)
POTASSIUM: 3.3 mmol/L — AB (ref 3.5–5.1)
Sodium: 137 mmol/L (ref 135–145)

## 2015-07-12 MED ORDER — ONDANSETRON HCL 4 MG/2ML IJ SOLN
4.0000 mg | Freq: Once | INTRAMUSCULAR | Status: AC
  Administered 2015-07-12: 4 mg via INTRAVENOUS
  Filled 2015-07-12: qty 2

## 2015-07-12 MED ORDER — CEPHALEXIN 500 MG PO CAPS: 500.0000 mg | ORAL_CAPSULE | Freq: Four times a day (QID) | ORAL | Status: DC

## 2015-07-12 MED ORDER — LORAZEPAM 0.5 MG PO TABS: 1.0000 mg | ORAL_TABLET | Freq: Three times a day (TID) | ORAL | Status: DC | PRN

## 2015-07-12 MED ORDER — MECLIZINE HCL 12.5 MG PO TABS
25.0000 mg | ORAL_TABLET | Freq: Once | ORAL | Status: AC
  Administered 2015-07-12: 25 mg via ORAL
  Filled 2015-07-12: qty 2

## 2015-07-12 MED ORDER — SODIUM CHLORIDE 0.9 % IV BOLUS (SEPSIS)
1000.0000 mL | Freq: Once | INTRAVENOUS | Status: AC
  Administered 2015-07-12: 1000 mL via INTRAVENOUS

## 2015-07-12 MED ORDER — KETOROLAC TROMETHAMINE 30 MG/ML IJ SOLN
30.0000 mg | Freq: Once | INTRAMUSCULAR | Status: AC
  Administered 2015-07-12: 30 mg via INTRAVENOUS
  Filled 2015-07-12: qty 1

## 2015-07-12 MED ORDER — LORAZEPAM 0.5 MG PO TABS
0.5000 mg | ORAL_TABLET | Freq: Once | ORAL | Status: AC
  Administered 2015-07-12: 0.5 mg via ORAL
  Filled 2015-07-12: qty 1

## 2015-07-12 MED ORDER — POTASSIUM CHLORIDE CRYS ER 20 MEQ PO TBCR
20.0000 meq | EXTENDED_RELEASE_TABLET | Freq: Once | ORAL | Status: AC
  Administered 2015-07-12: 20 meq via ORAL
  Filled 2015-07-12: qty 1

## 2015-07-12 NOTE — ED Notes (Signed)
Pt reports has been having multiple symptoms including dizziness, nausea, and weakness in legs since Aug 30.  Pt has a note a bedside that lists symptoms she had had since Aug 30.    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

## 2015-07-12 NOTE — Discharge Instructions (Signed)
Benign Positional Vertigo °Vertigo means you feel like you or your surroundings are moving when they are not. Benign positional vertigo is the most common form of vertigo. Benign means that the cause of your condition is not serious. Benign positional vertigo is more common in older adults. °CAUSES  °Benign positional vertigo is the result of an upset in the labyrinth system. This is an area in the middle ear that helps control your balance. This may be caused by a viral infection, head injury, or repetitive motion. However, often no specific cause is found. °SYMPTOMS  °Symptoms of benign positional vertigo occur when you move your head or eyes in different directions. Some of the symptoms may include: °· Loss of balance and falls. °· Vomiting. °· Blurred vision. °· Dizziness. °· Nausea. °· Involuntary eye movements (nystagmus). °DIAGNOSIS  °Benign positional vertigo is usually diagnosed by physical exam. If the specific cause of your benign positional vertigo is unknown, your caregiver may perform imaging tests, such as magnetic resonance imaging (MRI) or computed tomography (CT). °TREATMENT  °Your caregiver may recommend movements or procedures to correct the benign positional vertigo. Medicines such as meclizine, benzodiazepines, and medicines for nausea may be used to treat your symptoms. In rare cases, if your symptoms are caused by certain conditions that affect the inner ear, you may need surgery. °HOME CARE INSTRUCTIONS  °· Follow your caregiver's instructions. °· Move slowly. Do not make sudden body or head movements. °· Avoid driving. °· Avoid operating heavy machinery. °· Avoid performing any tasks that would be dangerous to you or others during a vertigo episode. °· Drink enough fluids to keep your urine clear or pale yellow. °SEEK IMMEDIATE MEDICAL CARE IF:  °· You develop problems with walking, weakness, numbness, or using your arms, hands, or legs. °· You have difficulty speaking. °· You develop  severe headaches. °· Your nausea or vomiting continues or gets worse. °· You develop visual changes. °· Your family or friends notice any behavioral changes. °· Your condition gets worse. °· You have a fever. °· You develop a stiff neck or sensitivity to light. °MAKE SURE YOU:  °· Understand these instructions. °· Will watch your condition. °· Will get help right away if you are not doing well or get worse. °Document Released: 07/08/2006 Document Revised: 12/23/2011 Document Reviewed: 06/20/2011 °[employer]® Patient Information ©2015 [employer]. This information is not intended to replace advice given to you by your health care provider. Make sure you discuss any questions you have with your health care provider. ° °Urinary Tract Infection °Urinary tract infections (UTIs) can develop anywhere along your urinary tract. Your urinary tract is your body's drainage system for removing wastes and extra water. Your urinary tract includes two kidneys, two ureters, a bladder, and a urethra. Your kidneys are a pair of bean-shaped organs. Each kidney is about the size of your fist. They are located below your ribs, one on each side of your spine. °CAUSES °Infections are caused by microbes, which are microscopic organisms, including fungi, viruses, and bacteria. These organisms are so small that they can only be seen through a microscope. Bacteria are the microbes that most commonly cause UTIs. °SYMPTOMS  °Symptoms of UTIs may vary by age and gender of the patient and by the location of the infection. Symptoms in [place] women typically include a frequent and intense urge to urinate and a painful, burning feeling in the bladder or urethra during urination. Older women and men are more likely to be tired, shaky,   and weak and have muscle aches and abdominal pain. A fever may mean the infection is in your kidneys. Other symptoms of a kidney infection include pain in your back or sides below the ribs, nausea, and  vomiting. DIAGNOSIS To diagnose a UTI, your caregiver will ask you about your symptoms. Your caregiver also will ask to provide a urine sample. The urine sample will be tested for bacteria and white blood cells. White blood cells are made by your body to help fight infection. TREATMENT  Typically, UTIs can be treated with medication. Because most UTIs are caused by a bacterial infection, they usually can be treated with the use of antibiotics. The choice of antibiotic and length of treatment depend on your symptoms and the type of bacteria causing your infection. HOME CARE INSTRUCTIONS  If you were prescribed antibiotics, take them exactly as your caregiver instructs you. Finish the medication even if you feel better after you have only taken some of the medication.  Drink enough water and fluids to keep your urine clear or pale yellow.  Avoid caffeine, tea, and carbonated beverages. They tend to irritate your bladder.  Empty your bladder often. Avoid holding urine for long periods of time.  Empty your bladder before and after sexual intercourse.  After a bowel movement, women should cleanse from front to back. Use each tissue only once. SEEK MEDICAL CARE IF:   You have back pain.  You develop a fever.  Your symptoms do not begin to resolve within 3 days. SEEK IMMEDIATE MEDICAL CARE IF:   You have severe back pain or lower abdominal pain.  You develop chills.  You have nausea or vomiting.  You have continued burning or discomfort with urination. MAKE SURE YOU:   Understand these instructions.  Will watch your condition.  Will get help right away if you are not doing well or get worse. Document Released: 07/10/2005 Document Revised: 03/31/2012 Document Reviewed: 11/08/2011 [facility] Patient Information 2015 [place], [place]. This information is not intended to replace advice given to you by your health care provider. Make sure you discuss any questions you have with your health  care provider.  Urinary Tract Infection Urinary tract infections (UTIs) can develop anywhere along your urinary tract. Your urinary tract is your body's drainage system for removing wastes and extra water. Your urinary tract includes two kidneys, two ureters, a bladder, and a urethra. Your kidneys are a pair of bean-shaped organs. Each kidney is about the size of your fist. They are located below your ribs, one on each side of your spine. CAUSES Infections are caused by microbes, which are microscopic organisms, including fungi, viruses, and bacteria. These organisms are so small that they can only be seen through a microscope. Bacteria are the microbes that most commonly cause UTIs. SYMPTOMS  Symptoms of UTIs may vary by age and gender of the patient and by the location of the infection. Symptoms in young women typically include a frequent and intense urge to urinate and a painful, burning feeling in the bladder or urethra during urination. Older women and men are more likely to be tired, shaky, and weak and have muscle aches and abdominal pain. A fever may mean the infection is in your kidneys. Other symptoms of a kidney infection include pain in your back or sides below the ribs, nausea, and vomiting. DIAGNOSIS To diagnose a UTI, your caregiver will ask you about your symptoms. Your caregiver also will ask to provide a urine sample. The urine sample will  be tested for bacteria and white blood cells. White blood cells are made by your body to help fight infection. TREATMENT  Typically, UTIs can be treated with medication. Because most UTIs are caused by a bacterial infection, they usually can be treated with the use of antibiotics. The choice of antibiotic and length of treatment depend on your symptoms and the type of bacteria causing your infection. HOME CARE INSTRUCTIONS  If you were prescribed antibiotics, take them exactly as your caregiver instructs you. Finish the medication even if you feel  better after you have only taken some of the medication.  Drink enough water and fluids to keep your urine clear or pale yellow.  Avoid caffeine, tea, and carbonated beverages. They tend to irritate your bladder.  Empty your bladder often. Avoid holding urine for long periods of time.  Empty your bladder before and after sexual intercourse.  After a bowel movement, women should cleanse from front to back. Use each tissue only once. SEEK MEDICAL CARE IF:   You have back pain.  You develop a fever.  Your symptoms do not begin to resolve within 3 days. SEEK IMMEDIATE MEDICAL CARE IF:   You have severe back pain or lower abdominal pain.  You develop chills.  You have nausea or vomiting.  You have continued burning or discomfort with urination. MAKE SURE YOU:   Understand these instructions.  Will watch your condition.  Will get help right away if you are not doing well or get worse. Document Released: 07/10/2005 Document Revised: 03/31/2012 Document Reviewed: 11/08/2011 [facility] Patient Information 2015 [place], [place]. This information is not intended to replace advice given to you by your health care provider. Make sure you discuss any questions you have with your health care provider.   Take Ativan along with your meclizine for better improvement in your dizziness.  I recommend holding your clonazepam while you are taking Ativan.  Follow-up with your neurologist as discussed for recheck if you're symptoms are not improving over the next week.  As discussed, you may also want to consider that some of your symptoms, especially the fatigue and drowsiness could be medication side effects.  Please discuss with your provider.

## 2015-07-12 NOTE — ED Notes (Signed)
Discharge instructions given to pt. Verbalized understanding.

## 2015-07-13 NOTE — ED Provider Notes (Signed)
CSN: [id number]     Arrival date & time 07/12/15  4098 History   First MD Initiated Contact with Patient 07/12/15 0820     Chief Complaint  Patient presents with  . Dizziness     (Consider location/radiation/quality/duration/timing/severity/associated sxs/prior Treatment) The history is provided by the patient.   Marisa Johnson is a 39 y.o. female with history as outlined below presenting with persistent dizziness despite being placed on meclizine yesterday by her pcp for benign peripheral vertigo yesterday.  She has so far taken 2 tablets, the last taken this morning before arrival here.  She describes feeling of the room moving with positional changes which improve when still and also has nausea, mild headache not similar to her migraines and generalized fatigue and drowsiness along with leg weakness which has been present for the past month.  She denies headaches, tinnitus, decreased hearing acuity, changes in visual acuity and denies focal weakness or numbness.    [name] has noticed having extreme drowsiness over the past 5 years, stating when she is not working, she is probably home in bed sleeping.  She has no energy. She has spoken to her headache specialist about this who feels she may be on too many sedating medicines.  She has discussed this with her psychiatrist who prescribes most of her medicines but per the patient, is reluctant to change or reduce her medicines.      Past Medical History  Diagnosis Date  . PTSD (post-traumatic stress disorder)   . Thyroid disease   . Anxiety   . Bipolar disorder   . Major depressive disorder   . Migraine   . Carpal tunnel syndrome    Past Surgical History  Procedure Laterality Date  . None     Family History  Problem Relation Age of Onset  . Colon cancer Neg Hx   . Diabetes Mother   . Hypertension Mother   . Hypertension Father   . Stroke Father   . Hyperlipidemia Maternal Grandmother   . Kidney disease Paternal Grandmother   .  Hypertension Paternal Grandmother   . Diabetes Daughter    Social History  Substance Use Topics  . Smoking status: Never Smoker   . Smokeless tobacco: Never Used     Comment: Never smoker  . Alcohol Use: No     Comment: occasionally   OB History    Gravida Para Term Preterm AB TAB SAB Ectopic Multiple Living   Review of Systems  Constitutional: Positive for fatigue. Negative for fever.  HENT: Negative for congestion, ear discharge, hearing loss, sinus pressure and sore throat.   Eyes: Negative.  Negative for visual disturbance.  Respiratory: Negative for chest tightness and shortness of breath.   Cardiovascular: Negative for chest pain.  Gastrointestinal: Positive for nausea. Negative for vomiting and abdominal pain.  Genitourinary: Negative.   Musculoskeletal: Negative for joint swelling, arthralgias and neck pain.  Skin: Negative.  Negative for rash and wound.  Neurological: Positive for dizziness, weakness and headaches. Negative for speech difficulty, light-headedness and numbness.  Psychiatric/Behavioral: Negative.       Allergies  Abilify; Hydrocodone; Reglan; and Dexamethasone  Home Medications   Prior to Admission medications   Medication Sig Start Date End Date Taking? Authorizing Provider  baclofen (LIORESAL) 10 MG tablet Take 10 mg by mouth 2 (two) times daily as needed for muscle spasms (limit 1-2 treatments per week).   Yes Historical  Provider, MD  buPROPion (WELLBUTRIN XL) 300 MG 24 hr tablet Take 300 mg by mouth at bedtime.   Yes Historical Provider, MD  clonazePAM (KLONOPIN) 0.5 MG tablet Take 1 mg by mouth at bedtime as needed for anxiety.   Yes Historical Provider, MD  cyclobenzaprine (FLEXERIL) 10 MG tablet Take 10 mg by mouth 2 (two) times daily.   Yes Historical Provider, MD  KARIVA 0.15-0.02/0.01 MG (21/5) tablet TAKE 1 TABLET BY MOUTH DAILY. 07/08/14  Yes Lazaro Arms, MD  lamoTRIgine (LAMICTAL) 200 MG tablet Take 200 mg by mouth at  bedtime.   Yes Historical Provider, MD  levothyroxine (SYNTHROID, LEVOTHROID) 112 MCG tablet Take 112 mcg by mouth daily before breakfast.   Yes Historical Provider, MD  Lurasidone HCl (LATUDA) 20 MG TABS Take 20 mg by mouth at bedtime.   Yes Historical Provider, MD  meclizine (ANTIVERT) 25 MG tablet Take 25 mg by mouth 3 (three) times daily as needed for dizziness.   Yes Historical Provider, MD  traZODone (DESYREL) 100 MG tablet Take 100 mg by mouth at bedtime.   Yes Historical Provider, MD  zonisamide (ZONEGRAN) 50 MG capsule Take 150 mg by mouth daily.   Yes Historical Provider, MD  cephALEXin (KEFLEX) 500 MG capsule Take 1 capsule (500 mg total) by mouth 4 (four) times daily. 07/12/15   Burgess Amor, PA-C  Linaclotide (LINZESS) 290 MCG CAPS capsule Take 1 capsule (290 mcg total) by mouth daily. Take 30 minutes before breakfast. 09/16/13   Nira Retort, NP  LORazepam (ATIVAN) 0.5 MG tablet Take 2 tablets (1 mg total) by mouth 3 (three) times daily as needed (dizziness). 07/12/15   Burgess Amor, PA-C  metroNIDAZOLE (FLAGYL) 500 MG tablet Take 1 tablet (500 mg total) by mouth 2 (two) times daily. 04/21/15   Tilda Burrow, MD  nystatin-triamcinolone ointment [facility]) Apply 1 application topically 2 (two) times daily. To affected area. 04/21/15   Tilda Burrow, MD  ondansetron (ZOFRAN-ODT) 8 MG disintegrating tablet Take 8 mg by mouth every 6 (Alizza Sacra) hours as needed for nausea or vomiting.    Historical Provider, MD   BP 116/82 mmHg  Pulse 73  Temp(Src) 98.7 F (37.1 C) (Oral)  Resp 18  Ht 5' 6.5" (1.689 m)  Wt 169 lb (76.658 kg)  BMI 26.87 kg/m2  SpO2 98%  LMP 06/22/2015 Physical Exam  Constitutional: She is oriented to person, place, and time. She appears well-developed and well-nourished.  HENT:  Head: Normocephalic and atraumatic.  Mouth/Throat: Oropharynx is clear and moist.  Eyes: EOM are normal. Pupils are equal, round, and reactive to light.  Neck: Normal range of motion. Neck supple.   Cardiovascular: Normal rate and normal heart sounds.   Pulmonary/Chest: Effort normal.  Abdominal: Soft. There is no tenderness.  Musculoskeletal: Normal range of motion.  Lymphadenopathy:    She has no cervical adenopathy.  Neurological: She is alert and oriented to person, place, and time. She has normal strength. No sensory deficit. Coordination and gait normal. GCS eye subscore is 4. GCS verbal subscore is 5. GCS motor subscore is 6.  Normal rapid alternating movements. Cranial nerves III-XII intact.  No pronator drift.   Skin: Skin is warm and dry. No rash noted.  Psychiatric: She has a normal mood and affect. Her speech is normal and behavior is normal. Thought content normal. Cognition and memory are normal.  Nursing note and vitals reviewed.   ED Course  Procedures (including critical care time) Labs Review Labs Reviewed  URINALYSIS, ROUTINE W REFLEX MICROSCOPIC (NOT AT [facility]) - Abnormal; Notable for the following:    Hgb urine dipstick TRACE (*)    All other components within normal limits  BASIC METABOLIC PANEL - Abnormal; Notable for the following:    Potassium 3.3 (*)    Calcium 8.2 (*)    All other components within normal limits  URINE MICROSCOPIC-ADD ON - Abnormal; Notable for the following:    Squamous Epithelial / LPF MANY (*)    Bacteria, UA MANY (*)    Crystals CA OXALATE CRYSTALS (*)    All other components within normal limits  URINE CULTURE  CBC WITH DIFFERENTIAL/PLATELET  POC URINE PREG, ED      MDM   Final diagnoses:  Peripheral vertigo, unspecified laterality  UTI (lower urinary tract infection)  Hypokalemia    Patients labs reviewed and discussed with her.  She was given ativan along with meclizine, zofran with improvement but not complete resolution of the vertigo, nausea better.  She was also given NS bolus along with toradol injection with relief of headache.   UTI , cx sent, pt started on keflex for this.  She was encouraged to continue  taking the meclizine but adding ativan for better sx improvement.  Mildly hypokalemic, oral replacement given.   She has no focal sx suggesting central source of vertigo sx. No recent febrile illness.  She was advised f/u with her pcp and/or her neurologist for recheck if sx are not resolved over the next 5-7 days.  Also advised to hold her clonazepam (only takes prn for sleep) while taking the meclizine and ativan.  Pt understands and agrees with plan.   Burgess Amor, PA-C 07/14/15 2209  Blane Ohara, MD 07/18/15 [phone]

## 2015-07-14 LAB — URINE CULTURE: Culture: >=100000

## 2015-07-15 ENCOUNTER — Telehealth (HOSPITAL_BASED_OUTPATIENT_CLINIC_OR_DEPARTMENT_OTHER): Payer: Self-pay | Admitting: Emergency Medicine

## 2015-07-15 NOTE — Telephone Encounter (Signed)
Post ED Visit - Positive Culture Follow-up  Culture report reviewed by antimicrobial stewardship pharmacist:   Celedonio Miyamoto, Pharm.D., BCPS  Georgina Pillion, Pharm.D., BCPS  [place], [address].D., BCPS, AAHIVP  Estella Husk, Pharm.D., BCPS, AAHIVP  [place], [address].D.  Tennis Must, Pharm.D. Velva Harman PharmD  Positive urine culture lactobacillus Treated with keflex,organism sensitive to same and no  Further pt followup needed at this time.  Berle Mull 07/15/2015, 2:09 PM

## 2015-08-09 ENCOUNTER — Encounter: Payer: Self-pay | Admitting: Adult Health

## 2015-08-09 ENCOUNTER — Other Ambulatory Visit (HOSPITAL_COMMUNITY)
Admission: RE | Admit: 2015-08-09 | Discharge: 2015-08-09 | Disposition: A | Discharge status: Home / Self Care | Payer: 59 | Source: Ambulatory Visit | Attending: Adult Health | Admitting: Adult Health

## 2015-08-09 ENCOUNTER — Ambulatory Visit (INDEPENDENT_AMBULATORY_CARE_PROVIDER_SITE_OTHER): Payer: 59 | Admitting: Adult Health

## 2015-08-09 VITALS — BP 120/70 | HR 94 | Ht 63.25 in | Wt 172.5 lb

## 2015-08-09 DIAGNOSIS — Z113 Encounter for screening for infections with a predominantly sexual mode of transmission: Secondary | ICD-10-CM | POA: Insufficient documentation

## 2015-08-09 DIAGNOSIS — Z8742 Personal history of other diseases of the female genital tract: Secondary | ICD-10-CM | POA: Insufficient documentation

## 2015-08-09 DIAGNOSIS — Z1151 Encounter for screening for human papillomavirus (HPV): Secondary | ICD-10-CM | POA: Diagnosis not present

## 2015-08-09 DIAGNOSIS — Z01419 Encounter for gynecological examination (general) (routine) without abnormal findings: Secondary | ICD-10-CM | POA: Diagnosis not present

## 2015-08-09 DIAGNOSIS — N921 Excessive and frequent menstruation with irregular cycle: Secondary | ICD-10-CM

## 2015-08-09 DIAGNOSIS — Z309 Encounter for contraceptive management, unspecified: Secondary | ICD-10-CM | POA: Insufficient documentation

## 2015-08-09 DIAGNOSIS — Z3041 Encounter for surveillance of contraceptive pills: Secondary | ICD-10-CM

## 2015-08-09 DIAGNOSIS — N888 Other specified noninflammatory disorders of cervix uteri: Secondary | ICD-10-CM

## 2015-08-09 DIAGNOSIS — R6882 Decreased libido: Secondary | ICD-10-CM

## 2015-08-09 HISTORY — DX: Personal history of other diseases of the female genital tract: Z87.42

## 2015-08-09 HISTORY — DX: Encounter for contraceptive management, unspecified: Z30.9

## 2015-08-09 HISTORY — DX: Decreased libido: R68.82

## 2015-08-09 HISTORY — DX: Excessive and frequent menstruation with irregular cycle: N92.1

## 2015-08-09 HISTORY — DX: Other specified noninflammatory disorders of cervix uteri: N88.8

## 2015-08-09 MED ORDER — NORETHIN-ETH ESTRAD-FE BIPHAS 1 MG-10 MCG / 10 MCG PO TABS: 1.0000 | ORAL_TABLET | Freq: Every day | ORAL | Status: DC

## 2015-08-09 MED ORDER — METRONIDAZOLE 0.75 % VA GEL: 1.0000 | Freq: Two times a day (BID) | VAGINAL | Status: DC

## 2015-08-09 NOTE — Progress Notes (Signed)
Patient ID: Marisa Johnson, female   DOB: Aug 03, 1976, 39 y.o.   MRN: [id number] History of Present Illness: Marisa Johnson is a 39 year old white female,in for a well woman gyn exam and pap.She is complaining of BTB,bleeding after sex,&,decreased libido.She has recently ended a relationship. PCP is Dr Ouida SillsFagan and she sees psychiatrist.  Current Medications, Allergies, Past Medical History, Past Surgical History, Family History and Social History were reviewed in [facility] electronic medical record.     Review of Systems: Patient denies any headaches, hearing loss, fatigue, blurred vision, shortness of breath, chest pain, abdominal pain, problems with bowel movements, urination. No joint pain or mood swings.See HPI for positives.    Physical Exam:BP 120/70 mmHg  Pulse 94  Ht 5' 3.25" (1.607 m)  Wt 172 lb 8 oz (78.245 kg)  BMI 30.30 kg/m2  LMP 07/20/2015 General:  Well developed, well nourished, no acute distress Skin:  Warm and dry Neck:  Midline trachea, normal thyroid, good ROM, no lymphadenopathy Lungs; Clear to auscultation bilaterally Breast:  No dominant palpable mass, retraction, or nipple discharge Cardiovascular: Regular rate and rhythm Abdomen:  Soft, non tender, no hepatosplenomegaly Pelvic:  External genitalia is normal in appearance, no lesions.  The vagina is normal in appearance. Urethra has no lesions or masses. The cervix is bulbous and friable with EC brush,pap with HPV and GC/CHL performed.  Uterus is felt to be normal size, shape, and contour.  No adnexal masses or tenderness noted.Bladder is non tender, no masses felt. Extremities/musculoskeletal:  No swelling or varicosities noted, no clubbing or cyanosis Psych:  No mood changes, alert and cooperative,seems happy Discussed emotions and libido and will try low dose pills, discussed she takes other meds that can affect libido.  Impression: Well woman gyn exam with pap BTB Decreased libido Friable cervix Contraceptive  management History of abnormal pap    Plan: Marta AntuFinish kariva then start lo loestrin,rx lo loestrin disp 1 pack take 1 daily, with 11 refills Rx metrogel use bid in vagina, no sex Follow up in 2 weeks for cervix check Physical in 1 year Use condoms

## 2015-08-09 NOTE — Patient Instructions (Signed)
Physical in  1 year [place] SpanishFinish karvia then start lo loestrin Use metrogel Use condoms Follow up in 2 weeks

## 2015-08-14 LAB — CYTOLOGY - PAP

## 2015-08-16 ENCOUNTER — Telehealth: Payer: Self-pay | Admitting: *Deleted

## 2015-08-16 NOTE — Telephone Encounter (Signed)
Spoke with pt. Pt was wondering how long to use the Metrogel. I advised to use until it was all gone. Also, gave pt results on pap; all was negative. Pt voiced understanding. JSY

## 2015-08-23 ENCOUNTER — Encounter: Payer: Self-pay | Admitting: Adult Health

## 2015-08-23 ENCOUNTER — Ambulatory Visit (INDEPENDENT_AMBULATORY_CARE_PROVIDER_SITE_OTHER): Payer: 59 | Admitting: Adult Health

## 2015-08-23 VITALS — BP 106/72 | HR 86 | Ht 63.0 in | Wt 172.5 lb

## 2015-08-23 DIAGNOSIS — N888 Other specified noninflammatory disorders of cervix uteri: Secondary | ICD-10-CM | POA: Diagnosis not present

## 2015-08-23 NOTE — Patient Instructions (Signed)
Follow up prn Call with any spotting or questions

## 2015-08-23 NOTE — Progress Notes (Signed)
Subjective:     Patient ID: Marisa Johnson, female   DOB: Mar 07, 1976, 39 y.o.   MRN: [id number]  HPI Marisa Johnson is a 39 year old white female in for recheck on friable cervix,has had sex with no spotting.  Review of Systems Patient denies any headaches, hearing loss, fatigue, blurred vision, shortness of breath, chest pain, abdominal pain, problems with bowel movements, urination, or intercourse. No joint pain or mood swings. Reviewed past medical,surgical, social and family history. Reviewed medications and allergies.     Objective:   Physical Exam BP 106/72 mmHg  Pulse 86  Ht 5\' 3"  (1.6 m)  Wt 172 lb 8 oz (78.245 kg)  BMI 30.56 kg/m2  LMP 07/20/2015 Skin warm and dry.Pelvic: external genitalia is normal in appearance no lesions, vagina: metrogel in vagina,urethra has no lesions or masses noted, cervix:everted at os but in no longer friable, uterus: normal size, shape and contour, non tender, no masses felt, adnexa: no masses or tenderness noted. Bladder is non tender and no masses felt.     Assessment:    Friable cervix resolved     Plan:     Follow up prn Call with any spotting or concerns

## 2015-11-02 DIAGNOSIS — H10011 Acute follicular conjunctivitis, right eye: Secondary | ICD-10-CM | POA: Diagnosis not present

## 2015-11-02 DIAGNOSIS — Z6829 Body mass index (BMI) 29.0-29.9, adult: Secondary | ICD-10-CM | POA: Diagnosis not present

## 2015-11-08 DIAGNOSIS — F3181 Bipolar II disorder: Secondary | ICD-10-CM | POA: Diagnosis not present

## 2015-12-27 DIAGNOSIS — F3181 Bipolar II disorder: Secondary | ICD-10-CM | POA: Diagnosis not present

## 2016-02-07 DIAGNOSIS — F3181 Bipolar II disorder: Secondary | ICD-10-CM | POA: Diagnosis not present

## 2016-04-04 DIAGNOSIS — S134XXA Sprain of ligaments of cervical spine, initial encounter: Secondary | ICD-10-CM | POA: Diagnosis not present

## 2016-05-28 DIAGNOSIS — M546 Pain in thoracic spine: Secondary | ICD-10-CM | POA: Diagnosis not present

## 2016-05-28 DIAGNOSIS — S335XXA Sprain of ligaments of lumbar spine, initial encounter: Secondary | ICD-10-CM | POA: Diagnosis not present

## 2016-05-28 DIAGNOSIS — S134XXA Sprain of ligaments of cervical spine, initial encounter: Secondary | ICD-10-CM | POA: Diagnosis not present

## 2016-05-30 DIAGNOSIS — F3181 Bipolar II disorder: Secondary | ICD-10-CM | POA: Diagnosis not present

## 2016-07-07 ENCOUNTER — Emergency Department (HOSPITAL_COMMUNITY)
Admission: EM | Admit: 2016-07-07 | Discharge: 2016-07-07 | Disposition: A | Discharge status: Home / Self Care | Payer: 59 | Attending: Emergency Medicine | Admitting: Emergency Medicine

## 2016-07-07 ENCOUNTER — Encounter (HOSPITAL_COMMUNITY): Payer: Self-pay | Admitting: Adult Health

## 2016-07-07 ENCOUNTER — Emergency Department (HOSPITAL_COMMUNITY): Payer: 59

## 2016-07-07 DIAGNOSIS — R202 Paresthesia of skin: Secondary | ICD-10-CM | POA: Insufficient documentation

## 2016-07-07 DIAGNOSIS — M79601 Pain in right arm: Secondary | ICD-10-CM | POA: Diagnosis present

## 2016-07-07 DIAGNOSIS — R51 Headache: Secondary | ICD-10-CM | POA: Diagnosis not present

## 2016-07-07 DIAGNOSIS — R200 Anesthesia of skin: Secondary | ICD-10-CM | POA: Diagnosis not present

## 2016-07-07 HISTORY — DX: Irritable bowel syndrome, unspecified: K58.9

## 2016-07-07 LAB — COMPREHENSIVE METABOLIC PANEL
ALT: 18 U/L (ref 14–54)
ANION GAP: 5 (ref 5–15)
AST: 23 U/L (ref 15–41)
Albumin: 4.1 g/dL (ref 3.5–5.0)
Alkaline Phosphatase: 54 U/L (ref 38–126)
BUN: 15 mg/dL (ref 6–20)
CO2: 24 mmol/L (ref 22–32)
CREATININE: 0.86 mg/dL (ref 0.44–1.00)
Calcium: 8.9 mg/dL (ref 8.9–10.3)
Chloride: 108 mmol/L (ref 101–111)
GFR calc Af Amer: >60 mL/min (ref >60)
GFR calc non Af Amer: >60 mL/min (ref >60)
Glucose, Bld: 103 mg/dL — ABNORMAL HIGH (ref 65–99)
Potassium: 3.9 mmol/L (ref 3.5–5.1)
Sodium: 137 mmol/L (ref 135–145)
Total Bilirubin: 0.6 mg/dL (ref 0.3–1.2)
Total Protein: 7 g/dL (ref 6.5–8.1)

## 2016-07-07 LAB — CBC WITH DIFFERENTIAL/PLATELET
Basophils Absolute: 0 10*3/uL (ref 0.0–0.1)
Basophils Relative: 0 %
Eosinophils Absolute: 0.1 10*3/uL (ref 0.0–0.7)
Eosinophils Relative: 2 %
HEMATOCRIT: 38.1 % (ref 36.0–46.0)
HEMOGLOBIN: 12.9 g/dL (ref 12.0–15.0)
LYMPHS ABS: 2.4 10*3/uL (ref 0.7–4.0)
Lymphocytes Relative: 44 %
MCH: 31.1 pg (ref 26.0–34.0)
MCHC: 33.9 g/dL (ref 30.0–36.0)
MCV: 91.8 fL (ref 78.0–100.0)
Monocytes Absolute: 0.6 10*3/uL (ref 0.1–1.0)
Monocytes Relative: 11 %
NEUTROS ABS: 2.3 10*3/uL (ref 1.7–7.7)
NEUTROS PCT: 43 %
Platelets: 272 10*3/uL (ref 150–400)
RBC: 4.15 MIL/uL (ref 3.87–5.11)
RDW: 12.1 % (ref 11.5–15.5)
WBC: 5.5 10*3/uL (ref 4.0–10.5)

## 2016-07-07 LAB — SEDIMENTATION RATE: Sed Rate: 2 mm/hr (ref 0–22)

## 2016-07-07 LAB — URINALYSIS, ROUTINE W REFLEX MICROSCOPIC
BILIRUBIN URINE: NEGATIVE
Glucose, UA: NEGATIVE mg/dL
Hgb urine dipstick: NEGATIVE
Ketones, ur: NEGATIVE mg/dL
Leukocytes, UA: NEGATIVE
Nitrite: NEGATIVE
Protein, ur: NEGATIVE mg/dL
SPECIFIC GRAVITY, URINE: 1.02 (ref 1.005–1.030)
pH: 8.5 — ABNORMAL HIGH (ref 5.0–8.0)

## 2016-07-07 LAB — POC URINE PREG, ED: Preg Test, Ur: NEGATIVE

## 2016-07-07 LAB — CK: Total CK: 72 U/L (ref 38–234)

## 2016-07-07 MED ORDER — DIAZEPAM 5 MG PO TABS
5.0000 mg | ORAL_TABLET | Freq: Once | ORAL | Status: AC
  Administered 2016-07-07: 5 mg via ORAL
  Filled 2016-07-07: qty 1

## 2016-07-07 MED ORDER — MORPHINE SULFATE (PF) 2 MG/ML IV SOLN
2.0000 mg | INTRAVENOUS | Status: AC | PRN
  Administered 2016-07-07 (×2): 2 mg via INTRAVENOUS
  Filled 2016-07-07 (×2): qty 1

## 2016-07-07 MED ORDER — OXYCODONE-ACETAMINOPHEN 5-325 MG PO TABS
1.0000 | ORAL_TABLET | Freq: Once | ORAL | Status: AC
  Administered 2016-07-07: 1 via ORAL
  Filled 2016-07-07: qty 1

## 2016-07-07 NOTE — ED Provider Notes (Signed)
[facility] DEPT Provider Note   CSN: [id number] Arrival date & time: 07/07/16  4098     History   Chief Complaint Chief Complaint  Patient presents with  . Pain    HPI Marisa Johnson is a 40 y.o. female.  HPI  Pt was seen at 1915. Per pt, c/o gradual onset and persistence of constant acute flair of her chronic bilat UE's and LE's "pain" for the past 9 months. Pt describes the pain as "heaviness" and "burning." States the pains start in her bilat shoulders and radiate into her bilat UE's; start in her bilat hips and radiates into her bilat LE's. Pt has been evaluated by her PMD and has been rx flexeril; without complete improvement. States she has experienced these symptoms intermittently for the past several years, but the symptoms have become constant for the past 9 months. Pt states her symptoms wax and wane but never completely go away.  Denies CP/SOB, no abd pain, no N/V/D, no focal motor weakness, no tingling/numbness in extremities, no rash, no fevers, no neck or back pain, no specific joints pain/swelling.    Past Medical History:  Diagnosis Date  . Anxiety   . Bipolar disorder (HCC)   . Carpal tunnel syndrome   . Contraceptive management 08/09/2015  . Decreased libido 08/09/2015  . Friable cervix 08/09/2015  . History of abnormal cervical Pap smear 08/09/2015  . IBS (irritable bowel syndrome)   . Irregular intermenstrual bleeding 08/09/2015  . Major depressive disorder (HCC)   . Migraine   . Pseudobulbar affect   . PTSD (post-traumatic stress disorder)   . Thyroid disease   . Vaginal Pap smear, abnormal     Patient Active Problem List   Diagnosis Date Noted  . Irregular intermenstrual bleeding 08/09/2015  . Decreased libido 08/09/2015  . Friable cervix 08/09/2015  . Contraceptive management 08/09/2015  . History of abnormal cervical Pap smear 08/09/2015  . Abdominal pain, left lower quadrant 09/21/2013  . Nausea and vomiting 09/20/2013  . IRRITABLE BOWEL  SYNDROME 05/29/2010  . CLOSED FRACTURE OF HEAD OF RADIUS 04/13/2008  . FX CLOSED FIBULA NOS 04/13/2008    Past Surgical History:  Procedure Laterality Date  . None      OB History    Gravida Para Term Preterm AB Living   3 3 3     3    SAB TAB Ectopic Multiple Live Births                   Home Medications    Prior to Admission medications   Medication Sig Start Date End Date Taking? Authorizing Provider  baclofen (LIORESAL) 10 MG tablet Take 10 mg by mouth 2 (two) times daily as needed for muscle spasms (limit 1-2 treatments per week).    Historical Provider, MD  buPROPion (WELLBUTRIN XL) 300 MG 24 hr tablet Take 300 mg by mouth at bedtime.    Historical Provider, MD  clonazePAM (KLONOPIN) 0.5 MG tablet Take 0.5 mg by mouth at bedtime as needed for anxiety.     Historical Provider, MD  lamoTRIgine (LAMICTAL) 200 MG tablet Take 200 mg by mouth at bedtime.    Historical Provider, MD  levothyroxine (SYNTHROID, LEVOTHROID) 112 MCG tablet Take 112 mcg by mouth daily before breakfast.    Historical Provider, MD  Linaclotide (LINZESS) 290 MCG CAPS capsule Take 1 capsule (290 mcg total) by mouth daily. Take 30 minutes before breakfast. Patient taking differently: Take 290 mcg by mouth daily. Prn 09/16/13  Gelene Mink, NP  Lurasidone HCl (LATUDA) 20 MG TABS Take 20 mg by mouth at bedtime.    Historical Provider, MD  meclizine (ANTIVERT) 25 MG tablet Take 25 mg by mouth 3 (three) times daily as needed for dizziness.    Historical Provider, MD  Norethindrone-Ethinyl Estradiol-Fe Biphas (LO LOESTRIN FE) 1 MG-10 MCG / 10 MCG tablet Take 1 tablet by mouth daily. Take 1 daily by mouth 08/09/15   [name], NP  nystatin-triamcinolone ointment [facility]) Apply 1 application topically 2 (two) times daily. To affected area. Patient taking differently: Apply 1 application topically as needed. To affected area. 04/21/15   Tilda Burrow, MD  traZODone (DESYREL) 100 MG tablet Take 100 mg by  mouth at bedtime.    Historical Provider, MD  zonisamide (ZONEGRAN) 50 MG capsule Take 150 mg by mouth daily.    Historical Provider, MD    Family History Family History  Problem Relation Age of Onset  . Diabetes Mother   . Hypertension Mother   . Hypertension Father   . Stroke Father   . Cancer Maternal Grandmother   . Kidney disease Paternal Grandmother   . Hypertension Paternal Grandmother   . Hyperlipidemia Paternal Grandmother   . Heart disease Paternal Grandmother   . Heart attack Paternal Grandmother   . Diabetes Daughter   . Hypothyroidism Daughter   . Anxiety disorder Sister   . Anxiety disorder Brother   . Anxiety disorder Sister   . Colon cancer Neg Hx     Social History Social History  Substance Use Topics  . Smoking status: Never Smoker  . Smokeless tobacco: Never Used     Comment: Never smoker  . Alcohol use No     Allergies   Abilify [aripiprazole]; Reglan [metoclopramide]; and Dexamethasone   Review of Systems Review of Systems ROS: Statement: All systems negative except as marked or noted in the HPI; Constitutional: Negative for fever and chills. ; ; Eyes: Negative for eye pain, redness and discharge. ; ; ENMT: Negative for ear pain, hoarseness, nasal congestion, sinus pressure and sore throat. ; ; Cardiovascular: Negative for chest pain, palpitations, diaphoresis, dyspnea and peripheral edema. ; ; Respiratory: Negative for cough, wheezing and stridor. ; ; Gastrointestinal: Negative for nausea, vomiting, diarrhea, abdominal pain, blood in stool, hematemesis, jaundice and rectal bleeding. . ; ; Genitourinary: Negative for dysuria, flank pain and hematuria. ; ; Musculoskeletal: Negative for back pain and neck pain. Negative for trauma.; ; Skin: Negative for pruritus, rash, abrasions, blisters, bruising and skin lesion.; ; Neuro: +"arms and legs heaviness and pain." Negative for headache, lightheadedness and neck stiffness. Negative for weakness, altered level  of consciousness, altered mental status, extremity weakness, paresthesias, involuntary movement, seizure and syncope.     Physical Exam Updated Vital Signs BP 117/75   Pulse 94   Resp 22   LMP 06/27/2016 (Exact Date)   SpO2 97%   Physical Exam 1920: Physical examination:  Nursing notes reviewed; Vital signs and O2 SAT reviewed;  Constitutional: Well developed, Well nourished, Well hydrated, In no acute distress; Head:  Normocephalic, atraumatic; Eyes: EOMI, PERRL, No scleral icterus; ENMT: Mouth and pharynx normal, Mucous membranes moist; Neck: Supple, Full range of motion, No lymphadenopathy; Cardiovascular: Regular rate and rhythm, No gallop; Respiratory: Breath sounds clear & equal bilaterally, No wheezes.  Speaking full sentences with ease, Normal respiratory effort/excursion; Chest: Nontender, Movement normal; Abdomen: Soft, Nontender, Nondistended, Normal bowel sounds; Genitourinary: No CVA tenderness; Spine:  No midline CS, TS,  LS tenderness. +TTP bilat cervical and lumbar paraspinal muscles.;; Extremities: Pulses normal, Pelvis stable. No UE's or LE's specific joints tenderness, edema, erythema. No deformity  No calf edema or asymmetry.  Motor strength at shoulders normal.  Sensation intact over deltoid regions, distal NMS intact with bilateral hands having intact and equal sensation and strength in the distribution of the median, radial, and ulnar nerve function compared to opposite side.  Strong radial pulses. Bilat UE's and LE's muscles compartments soft.; Neuro: AA&Ox3, Major CN grossly intact.  Speech clear. No gross focal motor or sensory deficits in extremities.; Skin: Color normal, Warm, Dry.   ED Treatments / Results  Labs (all labs ordered are listed, but only abnormal results are displayed)   EKG  EKG Interpretation None       Radiology   Procedures Procedures (including critical care time)  Medications Ordered in ED Medications  morphine 2 MG/ML injection 2 mg  (2 mg Intravenous Given 07/07/16 1955)  diazepam (VALIUM) tablet 5 mg (5 mg Oral Given 07/07/16 1954)     Initial Impression / Assessment and Plan / ED Course  I have reviewed the triage vital signs and the nursing notes.  Pertinent labs & imaging results that were available during my care of the patient were reviewed by me and considered in my medical decision making (see chart for details).  MDM Reviewed: previous chart, nursing note and vitals Reviewed previous: labs and MRI Interpretation: labs and CT scan   Results for orders placed or performed during the hospital encounter of 07/07/16  Comprehensive metabolic panel  Result Value Ref Range   Sodium 137 135 - 145 mmol/L   Potassium 3.9 3.5 - 5.1 mmol/L   Chloride 108 101 - 111 mmol/L   CO2 24 22 - 32 mmol/L   Glucose, Bld 103 (H) 65 - 99 mg/dL   BUN 15 6 - 20 mg/dL   Creatinine, Ser 1.610.86 0.44 - 1.00 mg/dL   Calcium 8.9 8.9 - 09.610.3 mg/dL   Total Protein 7.0 6.5 - 8.1 g/dL   Albumin 4.1 3.5 - 5.0 g/dL   AST 23 15 - 41 U/L   ALT 18 14 - 54 U/L   Alkaline Phosphatase 54 38 - 126 U/L   Total Bilirubin 0.6 0.3 - 1.2 mg/dL   GFR calc non Af Amer >60 >60 mL/min   GFR calc Af Amer >60 >60 mL/min   Anion gap 5 5 - 15  CBC with Differential  Result Value Ref Range   WBC 5.5 4.0 - 10.5 K/uL   RBC 4.15 3.87 - 5.11 MIL/uL   Hemoglobin 12.9 12.0 - 15.0 g/dL   HCT 04.538.1 40.936.0 - 81.146.0 %   MCV 91.8 78.0 - 100.0 fL   MCH 31.1 26.0 - 34.0 pg   MCHC 33.9 30.0 - 36.0 g/dL   RDW 91.412.1 78.211.5 - 95.615.5 %   Platelets 272 150 - 400 K/uL   Neutrophils Relative % 43 %   Neutro Abs 2.3 1.7 - 7.7 K/uL   Lymphocytes Relative 44 %   Lymphs Abs 2.4 0.7 - 4.0 K/uL   Monocytes Relative 11 %   Monocytes Absolute 0.6 0.1 - 1.0 K/uL   Eosinophils Relative 2 %   Eosinophils Absolute 0.1 0.0 - 0.7 K/uL   Basophils Relative 0 %   Basophils Absolute 0.0 0.0 - 0.1 K/uL  Urinalysis, Routine w reflex microscopic  Result Value Ref Range   Color, Urine YELLOW  YELLOW   APPearance CLEAR CLEAR  Specific Gravity, Urine 1.020 1.005 - 1.030   pH 8.5 (H) 5.0 - 8.0   Glucose, UA NEGATIVE NEGATIVE mg/dL   Hgb urine dipstick NEGATIVE NEGATIVE   Bilirubin Urine NEGATIVE NEGATIVE   Ketones, ur NEGATIVE NEGATIVE mg/dL   Protein, ur NEGATIVE NEGATIVE mg/dL   Nitrite NEGATIVE NEGATIVE   Leukocytes, UA NEGATIVE NEGATIVE  CK  Result Value Ref Range   Total CK 72 38 - 234 U/L  Sedimentation rate  Result Value Ref Range   Sed Rate 2 0 - 22 mm/hr  POC Urine Pregnancy, ED (do NOT order at [facility])  Result Value Ref Range   Preg Test, Ur NEGATIVE NEGATIVE   Ct Head Wo Contrast Result Date: 07/07/2016 CLINICAL DATA:  Headache with upper extremity pain and numbness EXAM: CT HEAD WITHOUT CONTRAST CT CERVICAL SPINE WITHOUT CONTRAST TECHNIQUE: Multidetector CT imaging of the head and cervical spine was performed following the standard protocol without intravenous contrast. Multiplanar CT image reconstructions of the cervical spine were also generated. COMPARISON:  Cervical spine radiographs Mar 08, 2009; head CT December 24, 2013; brain MRI December 24, 2013 FINDINGS: CT HEAD FINDINGS Brain: The ventricles are normal in size and configuration. There is no intracranial mass, hemorrhage, extra-axial fluid collection, or midline shift. Gray-white compartments appear normal. No acute infarct evident. Vascular: There is no hyperdense vessel. There is no evident vascular calcification. Skull: The bony calvarium appears intact. Sinuses/Orbits: There is opacification throughout much of the left sphenoid sinus. Other paranasal sinuses which are visualized are clear. Visualized orbits appear symmetric bilaterally. Other: Mastoid air cells are clear. CT CERVICAL SPINE FINDINGS Alignment: There is no spondylolisthesis. Skull base and vertebrae: Skullbase appears normal. Craniocervical junction appears within normal limits. There is no fracture. No blastic or lytic bone lesion. Soft tissues and  spinal canal: Prevertebral soft tissues and predental space regions are normal. No paraspinous lesions are evident. No spinal stenosis. Disc levels: The disc spaces appear normal. There is no nerve root edema or effacement in the cervical region. No disc extrusion. Upper chest: Visualized lung apices are clear. Other: None IMPRESSION: CT head: Left sphenoid sinusitis. No intracranial mass, hemorrhage, or focal gray -white compartment lesion. CT cervical spine: No fracture or spondylolisthesis. No apparent arthropathy. No nerve root edema or effacement. No disc extrusion or stenosis. Electronically Signed   By: Bretta Bang III M.D.   On: 07/07/2016 20:29   Ct Cervical Spine Wo Contrast Result Date: 07/07/2016 CLINICAL DATA:  Headache with upper extremity pain and numbness EXAM: CT HEAD WITHOUT CONTRAST CT CERVICAL SPINE WITHOUT CONTRAST TECHNIQUE: Multidetector CT imaging of the head and cervical spine was performed following the standard protocol without intravenous contrast. Multiplanar CT image reconstructions of the cervical spine were also generated. COMPARISON:  Cervical spine radiographs Mar 08, 2009; head CT December 24, 2013; brain MRI December 24, 2013 FINDINGS: CT HEAD FINDINGS Brain: The ventricles are normal in size and configuration. There is no intracranial mass, hemorrhage, extra-axial fluid collection, or midline shift. Gray-white compartments appear normal. No acute infarct evident. Vascular: There is no hyperdense vessel. There is no evident vascular calcification. Skull: The bony calvarium appears intact. Sinuses/Orbits: There is opacification throughout much of the left sphenoid sinus. Other paranasal sinuses which are visualized are clear. Visualized orbits appear symmetric bilaterally. Other: Mastoid air cells are clear. CT CERVICAL SPINE FINDINGS Alignment: There is no spondylolisthesis. Skull base and vertebrae: Skullbase appears normal. Craniocervical junction appears within normal  limits. There is no fracture. No blastic or  lytic bone lesion. Soft tissues and spinal canal: Prevertebral soft tissues and predental space regions are normal. No paraspinous lesions are evident. No spinal stenosis. Disc levels: The disc spaces appear normal. There is no nerve root edema or effacement in the cervical region. No disc extrusion. Upper chest: Visualized lung apices are clear. Other: None IMPRESSION: CT head: Left sphenoid sinusitis. No intracranial mass, hemorrhage, or focal gray -white compartment lesion. CT cervical spine: No fracture or spondylolisthesis. No apparent arthropathy. No nerve root edema or effacement. No disc extrusion or stenosis. Electronically Signed   By: Bretta Bang III M.D.   On: 07/07/2016 20:29   Ct Lumbar Spine Wo Contrast Result Date: 07/07/2016 CLINICAL DATA:  Numbness and heaviness in both arms and legs since January, 2017. The patient reports her legs intermittently lock. No known injury. EXAM: CT LUMBAR SPINE WITHOUT CONTRAST TECHNIQUE: Multidetector CT imaging of the lumbar spine was performed without intravenous contrast administration. Multiplanar CT image reconstructions were also generated. COMPARISON:  None. FINDINGS: Segmentation: Unremarkable. Alignment: Maintained. Vertebrae: Appear normal. Paraspinal and other soft tissues: Appear normal. Disc levels: No disc bulge or protrusion is identified. The central spinal canal and foramina appear widely patent at all levels. IMPRESSION: Negative lumbar spine CT scan. Electronically Signed   By: Drusilla Kanner M.D.   On: 07/07/2016 20:31    2200:  2015 MRI brain without signs of MS. Feels improved after meds. CRP is send out tomorrow. T/C to Neuro Dr. Gerilyn Pilgrim, case discussed, including:  HPI, pertinent PM/SHx, VS/PE, dx testing, ED course and treatment:  Agrees with ED workup, nothing to add at this time, tx symptomatically, f/u office in the next few weeks. Dx and testing, as well as d/w Neuro MD, d/w pt  and family.  Questions answered.  Verb understanding, agreeable to d/c home with outpt f/u.   Final Clinical Impressions(s) / ED Diagnoses   Final diagnoses:  None    New Prescriptions New Prescriptions   No medications on file     Samuel Jester, DO 07/10/16 1919

## 2016-07-07 NOTE — ED Triage Notes (Signed)
Presents with bilateral hip pain and into both legs and feet, legs feel weak and heavy and burning pain. Pt is tearful. Also nauseatedpain is alos in bilateral shoulders down, same feelilng as legs.

## 2016-07-07 NOTE — Discharge Instructions (Signed)
Take your usual prescriptions as previously directed.  Take over the counter tylenol and Alleve, as directed on packaging, as needed for discomfort. Apply moist heat or ice to the area(s) of discomfort, for 15 minutes at a time, several times per day for the next few days.  Do not fall asleep on a heating or ice pack.  Try to get yourself on a regular sleeping, eating, and gentle exercise schedule. Call your regular medical doctor and the Neurologist on Monday to schedule a follow up appointment this week. You may want to talk to your doctor about a referral to a Rheumatologist.  Return to the Emergency Department immediately if worsening.

## 2016-07-08 LAB — C-REACTIVE PROTEIN: CRP: 0.9 mg/dL (ref <1.0)

## 2016-07-09 ENCOUNTER — Other Ambulatory Visit: Payer: Self-pay | Admitting: Neurology

## 2016-07-09 DIAGNOSIS — R5383 Other fatigue: Secondary | ICD-10-CM | POA: Diagnosis not present

## 2016-07-09 DIAGNOSIS — R531 Weakness: Secondary | ICD-10-CM

## 2016-07-09 DIAGNOSIS — G43719 Chronic migraine without aura, intractable, without status migrainosus: Secondary | ICD-10-CM | POA: Diagnosis not present

## 2016-07-09 DIAGNOSIS — R449 Unspecified symptoms and signs involving general sensations and perceptions: Secondary | ICD-10-CM | POA: Diagnosis not present

## 2016-07-09 DIAGNOSIS — G8929 Other chronic pain: Secondary | ICD-10-CM | POA: Diagnosis not present

## 2016-07-09 DIAGNOSIS — R202 Paresthesia of skin: Secondary | ICD-10-CM

## 2016-07-09 DIAGNOSIS — R200 Anesthesia of skin: Secondary | ICD-10-CM

## 2016-07-10 ENCOUNTER — Ambulatory Visit (HOSPITAL_COMMUNITY)
Admission: RE | Admit: 2016-07-10 | Discharge: 2016-07-10 | Disposition: A | Discharge status: Home / Self Care | Payer: 59 | Source: Ambulatory Visit | Attending: Neurology | Admitting: Neurology

## 2016-07-10 DIAGNOSIS — R200 Anesthesia of skin: Secondary | ICD-10-CM

## 2016-07-10 DIAGNOSIS — M50223 Other cervical disc displacement at C6-C7 level: Secondary | ICD-10-CM | POA: Diagnosis not present

## 2016-07-10 DIAGNOSIS — R202 Paresthesia of skin: Secondary | ICD-10-CM | POA: Insufficient documentation

## 2016-07-10 DIAGNOSIS — R531 Weakness: Secondary | ICD-10-CM

## 2016-07-22 DIAGNOSIS — E538 Deficiency of other specified B group vitamins: Secondary | ICD-10-CM | POA: Diagnosis not present

## 2016-07-22 DIAGNOSIS — M818 Other osteoporosis without current pathological fracture: Secondary | ICD-10-CM | POA: Diagnosis not present

## 2016-07-22 DIAGNOSIS — Z79899 Other long term (current) drug therapy: Secondary | ICD-10-CM | POA: Diagnosis not present

## 2016-07-22 DIAGNOSIS — E559 Vitamin D deficiency, unspecified: Secondary | ICD-10-CM | POA: Diagnosis not present

## 2016-07-22 DIAGNOSIS — R5383 Other fatigue: Secondary | ICD-10-CM | POA: Diagnosis not present

## 2016-07-25 DIAGNOSIS — M5416 Radiculopathy, lumbar region: Secondary | ICD-10-CM | POA: Diagnosis not present

## 2016-07-25 DIAGNOSIS — G603 Idiopathic progressive neuropathy: Secondary | ICD-10-CM | POA: Diagnosis not present

## 2016-08-01 DIAGNOSIS — F3181 Bipolar II disorder: Secondary | ICD-10-CM | POA: Diagnosis not present

## 2016-08-09 DIAGNOSIS — H52223 Regular astigmatism, bilateral: Secondary | ICD-10-CM | POA: Diagnosis not present

## 2016-08-09 DIAGNOSIS — H5213 Myopia, bilateral: Secondary | ICD-10-CM | POA: Diagnosis not present

## 2016-08-13 DIAGNOSIS — M797 Fibromyalgia: Secondary | ICD-10-CM | POA: Diagnosis not present

## 2016-08-13 DIAGNOSIS — R5383 Other fatigue: Secondary | ICD-10-CM | POA: Diagnosis not present

## 2016-08-13 DIAGNOSIS — E539 Vitamin B deficiency, unspecified: Secondary | ICD-10-CM | POA: Diagnosis not present

## 2016-08-13 DIAGNOSIS — G603 Idiopathic progressive neuropathy: Secondary | ICD-10-CM | POA: Diagnosis not present

## 2016-08-15 DIAGNOSIS — M797 Fibromyalgia: Secondary | ICD-10-CM | POA: Diagnosis not present

## 2016-08-15 DIAGNOSIS — R5383 Other fatigue: Secondary | ICD-10-CM | POA: Diagnosis not present

## 2016-08-15 DIAGNOSIS — E539 Vitamin B deficiency, unspecified: Secondary | ICD-10-CM | POA: Diagnosis not present

## 2016-08-15 DIAGNOSIS — G603 Idiopathic progressive neuropathy: Secondary | ICD-10-CM | POA: Diagnosis not present

## 2016-08-22 ENCOUNTER — Other Ambulatory Visit: Payer: Self-pay | Admitting: Adult Health

## 2016-09-10 DIAGNOSIS — F3181 Bipolar II disorder: Secondary | ICD-10-CM | POA: Diagnosis not present

## 2016-09-24 ENCOUNTER — Ambulatory Visit (HOSPITAL_COMMUNITY)
Admission: RE | Admit: 2016-09-24 | Discharge: 2016-09-24 | Disposition: A | Discharge status: Home / Self Care | Payer: 59 | Source: Ambulatory Visit | Attending: Pulmonary Disease | Admitting: Pulmonary Disease

## 2016-09-24 ENCOUNTER — Other Ambulatory Visit (HOSPITAL_COMMUNITY): Payer: Self-pay | Admitting: Pulmonary Disease

## 2016-09-24 DIAGNOSIS — R079 Chest pain, unspecified: Secondary | ICD-10-CM | POA: Insufficient documentation

## 2016-09-24 DIAGNOSIS — R0602 Shortness of breath: Secondary | ICD-10-CM

## 2016-09-24 DIAGNOSIS — J329 Chronic sinusitis, unspecified: Secondary | ICD-10-CM | POA: Diagnosis not present

## 2016-09-24 MED ORDER — IOPAMIDOL (ISOVUE-370) INJECTION 76%
100.0000 mL | Freq: Once | INTRAVENOUS | Status: AC | PRN
  Administered 2016-09-24: 100 mL via INTRAVENOUS

## 2016-11-12 DIAGNOSIS — R5383 Other fatigue: Secondary | ICD-10-CM | POA: Diagnosis not present

## 2016-11-12 DIAGNOSIS — M797 Fibromyalgia: Secondary | ICD-10-CM | POA: Diagnosis not present

## 2016-11-12 DIAGNOSIS — R269 Unspecified abnormalities of gait and mobility: Secondary | ICD-10-CM | POA: Diagnosis not present

## 2016-11-12 DIAGNOSIS — E039 Hypothyroidism, unspecified: Secondary | ICD-10-CM | POA: Diagnosis not present

## 2016-11-12 DIAGNOSIS — E539 Vitamin B deficiency, unspecified: Secondary | ICD-10-CM | POA: Diagnosis not present

## 2016-11-12 DIAGNOSIS — G609 Hereditary and idiopathic neuropathy, unspecified: Secondary | ICD-10-CM | POA: Diagnosis not present

## 2016-11-13 DIAGNOSIS — F419 Anxiety disorder, unspecified: Secondary | ICD-10-CM | POA: Diagnosis not present

## 2016-11-13 DIAGNOSIS — F329 Major depressive disorder, single episode, unspecified: Secondary | ICD-10-CM | POA: Diagnosis not present

## 2016-11-13 DIAGNOSIS — Z79899 Other long term (current) drug therapy: Secondary | ICD-10-CM | POA: Diagnosis not present

## 2016-11-13 DIAGNOSIS — E039 Hypothyroidism, unspecified: Secondary | ICD-10-CM | POA: Diagnosis not present

## 2016-11-13 DIAGNOSIS — G43909 Migraine, unspecified, not intractable, without status migrainosus: Secondary | ICD-10-CM | POA: Diagnosis not present

## 2016-11-17 ENCOUNTER — Encounter (HOSPITAL_COMMUNITY): Payer: Self-pay | Admitting: *Deleted

## 2016-11-17 ENCOUNTER — Encounter (HOSPITAL_COMMUNITY)
Admission: EM | Disposition: A | Discharge status: Home Health | Payer: Self-pay | Source: Home / Self Care | Attending: Emergency Medicine

## 2016-11-17 ENCOUNTER — Emergency Department (HOSPITAL_COMMUNITY)
Admission: EM | Admit: 2016-11-17 | Discharge: 2016-11-17 | Disposition: A | Discharge status: Home Health | Payer: 59 | Attending: Emergency Medicine | Admitting: Emergency Medicine

## 2016-11-17 DIAGNOSIS — Z79899 Other long term (current) drug therapy: Secondary | ICD-10-CM | POA: Insufficient documentation

## 2016-11-17 DIAGNOSIS — F4310 Post-traumatic stress disorder, unspecified: Secondary | ICD-10-CM | POA: Insufficient documentation

## 2016-11-17 DIAGNOSIS — M2550 Pain in unspecified joint: Secondary | ICD-10-CM | POA: Diagnosis not present

## 2016-11-17 DIAGNOSIS — Z793 Long term (current) use of hormonal contraceptives: Secondary | ICD-10-CM | POA: Diagnosis not present

## 2016-11-17 DIAGNOSIS — T18128A Food in esophagus causing other injury, initial encounter: Secondary | ICD-10-CM | POA: Insufficient documentation

## 2016-11-17 DIAGNOSIS — K589 Irritable bowel syndrome without diarrhea: Secondary | ICD-10-CM | POA: Insufficient documentation

## 2016-11-17 DIAGNOSIS — F319 Bipolar disorder, unspecified: Secondary | ICD-10-CM | POA: Diagnosis not present

## 2016-11-17 DIAGNOSIS — K3189 Other diseases of stomach and duodenum: Secondary | ICD-10-CM | POA: Diagnosis not present

## 2016-11-17 DIAGNOSIS — K228 Other specified diseases of esophagus: Secondary | ICD-10-CM | POA: Diagnosis not present

## 2016-11-17 DIAGNOSIS — T18108A Unspecified foreign body in esophagus causing other injury, initial encounter: Secondary | ICD-10-CM

## 2016-11-17 DIAGNOSIS — F419 Anxiety disorder, unspecified: Secondary | ICD-10-CM | POA: Diagnosis not present

## 2016-11-17 DIAGNOSIS — X58XXXA Exposure to other specified factors, initial encounter: Secondary | ICD-10-CM | POA: Diagnosis not present

## 2016-11-17 HISTORY — PX: ESOPHAGOGASTRODUODENOSCOPY: SHX5428

## 2016-11-17 LAB — BASIC METABOLIC PANEL
ANION GAP: 9 (ref 5–15)
BUN: 11 mg/dL (ref 6–20)
CALCIUM: 8.9 mg/dL (ref 8.9–10.3)
CO2: 25 mmol/L (ref 22–32)
Chloride: 99 mmol/L — ABNORMAL LOW (ref 101–111)
Creatinine, Ser: 0.85 mg/dL (ref 0.44–1.00)
GFR calc Af Amer: >60 mL/min (ref >60)
GLUCOSE: 111 mg/dL — AB (ref 65–99)
POTASSIUM: 3 mmol/L — AB (ref 3.5–5.1)
SODIUM: 133 mmol/L — AB (ref 135–145)

## 2016-11-17 LAB — CBC WITH DIFFERENTIAL/PLATELET
BASOS ABS: 0 10*3/uL (ref 0.0–0.1)
BASOS PCT: 0 %
EOS PCT: 2 %
Eosinophils Absolute: 0.2 10*3/uL (ref 0.0–0.7)
HCT: 38.1 % (ref 36.0–46.0)
Hemoglobin: 12.8 g/dL (ref 12.0–15.0)
LYMPHS PCT: 48 %
Lymphs Abs: 3.8 10*3/uL (ref 0.7–4.0)
MCH: 30.4 pg (ref 26.0–34.0)
MCHC: 33.6 g/dL (ref 30.0–36.0)
MCV: 90.5 fL (ref 78.0–100.0)
MONO ABS: 0.6 10*3/uL (ref 0.1–1.0)
Monocytes Relative: 7 %
Neutro Abs: 3.4 10*3/uL (ref 1.7–7.7)
Neutrophils Relative %: 43 %
PLATELETS: 243 10*3/uL (ref 150–400)
RBC: 4.21 MIL/uL (ref 3.87–5.11)
RDW: 12.9 % (ref 11.5–15.5)
WBC: 8 10*3/uL (ref 4.0–10.5)

## 2016-11-17 SURGERY — EGD (ESOPHAGOGASTRODUODENOSCOPY)
Anesthesia: Moderate Sedation

## 2016-11-17 MED ORDER — MIDAZOLAM HCL 5 MG/5ML IJ SOLN
INTRAMUSCULAR | Status: DC | PRN
  Administered 2016-11-17 (×2): 2 mg via INTRAVENOUS
  Administered 2016-11-17 (×2): 3 mg via INTRAVENOUS

## 2016-11-17 MED ORDER — MIDAZOLAM HCL 5 MG/5ML IJ SOLN
INTRAMUSCULAR | Status: AC
  Filled 2016-11-17: qty 10

## 2016-11-17 MED ORDER — GLUCAGON HCL RDNA (DIAGNOSTIC) 1 MG IJ SOLR
1.0000 mg | Freq: Once | INTRAMUSCULAR | Status: AC
  Administered 2016-11-17: 1 mg via INTRAVENOUS
  Filled 2016-11-17: qty 1

## 2016-11-17 MED ORDER — MEPERIDINE HCL 50 MG/ML IJ SOLN
INTRAMUSCULAR | Status: DC | PRN
  Administered 2016-11-17 (×2): 25 mg via INTRAVENOUS

## 2016-11-17 MED ORDER — MEPERIDINE HCL 50 MG/ML IJ SOLN
INTRAMUSCULAR | Status: AC
  Filled 2016-11-17: qty 1

## 2016-11-17 MED ORDER — BUTAMBEN-TETRACAINE-BENZOCAINE 2-2-14 % EX AERO
INHALATION_SPRAY | CUTANEOUS | Status: DC | PRN
  Administered 2016-11-17: 2 via TOPICAL

## 2016-11-17 NOTE — ED Provider Notes (Signed)
[facility]Y DEPT Provider Note   CSN: [id number] Arrival date & time: 11/17/16  0031 By signing my name below, I, Levon Hedger, attest that this documentation has been prepared under the direction and in the presence of Dione Booze, MD . Electronically Signed: Levon Hedger, Scribe. 11/17/2016. 12:40 AM   History   Chief Complaint Chief Complaint  Patient presents with  . Foreign Body    HPI Marisa Johnson is a 41 y.o. female who presents to the Emergency Department complaining of sudden onset foreign body in throat since 10 PM last night. Pt states she was eating a piece of a ham when it became lodged in her throat. She notes associated vomiting and moderate discomfort to the area. Per pt, she tried eating eating peanuts to help the ham go down her throat with no relief. She has also tried drinking water, but states she had thrown up the water each time. No alleviating or modifying factors noted. Per pt, for one week she has had difficulty swallowing meats, potatoes, bread and apples for 1 week. Pt has no other complaints or symptoms at this time. She denies any hx of GERD.   The history is provided by the patient. No language interpreter was used.    Past Medical History:  Diagnosis Date  . Anxiety   . Bipolar disorder (HCC)   . Carpal tunnel syndrome   . Contraceptive management 08/09/2015  . Decreased libido 08/09/2015  . Friable cervix 08/09/2015  . History of abnormal cervical Pap smear 08/09/2015  . IBS (irritable bowel syndrome)   . Irregular intermenstrual bleeding 08/09/2015  . Major depressive disorder   . Migraine   . Pseudobulbar affect   . PTSD (post-traumatic stress disorder)   . Thyroid disease   . Vaginal Pap smear, abnormal     Patient Active Problem List   Diagnosis Date Noted  . Irregular intermenstrual bleeding 08/09/2015  . Decreased libido 08/09/2015  . Friable cervix 08/09/2015  . Contraceptive management 08/09/2015  . History of abnormal  cervical Pap smear 08/09/2015  . Abdominal pain, left lower quadrant 09/21/2013  . Nausea and vomiting 09/20/2013  . IRRITABLE BOWEL SYNDROME 05/29/2010  . CLOSED FRACTURE OF HEAD OF RADIUS 04/13/2008  . FX CLOSED FIBULA NOS 04/13/2008    Past Surgical History:  Procedure Laterality Date  . None      OB History    Gravida Para Term Preterm AB Living   3 3 3     3    SAB TAB Ectopic Multiple Live Births                   Home Medications    Prior to Admission medications   Medication Sig Start Date End Date Taking? Authorizing Provider  baclofen (LIORESAL) 10 MG tablet Take 10 mg by mouth 2 (two) times daily as needed for muscle spasms (limit 1-2 treatments per week).    Historical Provider, MD  buPROPion (WELLBUTRIN XL) 300 MG 24 hr tablet Take 300 mg by mouth at bedtime.    Historical Provider, MD  clonazePAM (KLONOPIN) 0.5 MG tablet Take 0.5 mg by mouth at bedtime as needed for anxiety.     Historical Provider, MD  lamoTRIgine (LAMICTAL) 200 MG tablet Take 200 mg by mouth at bedtime.    Historical Provider, MD  levothyroxine (SYNTHROID, LEVOTHROID) 112 MCG tablet Take 112 mcg by mouth daily before breakfast.    Historical Provider, MD  Linaclotide (LINZESS) 290 MCG CAPS capsule Take 1 capsule (  290 mcg total) by mouth daily. Take 30 minutes before breakfast. Patient taking differently: Take 290 mcg by mouth daily. Prn 09/16/13   Gelene Mink, NP  LO LOESTRIN FE 1 MG-10 MCG / 10 MCG tablet TAKE 1 TABLET BY MOUTH DAILY. 08/22/16   [name], NP  Lurasidone HCl (LATUDA) 20 MG TABS Take 20 mg by mouth at bedtime.    Historical Provider, MD  meclizine (ANTIVERT) 25 MG tablet Take 25 mg by mouth 3 (three) times daily as needed for dizziness.    Historical Provider, MD  nystatin-triamcinolone ointment (MYCOLOG) Apply 1 application topically 2 (two) times daily. To affected area. Patient taking differently: Apply 1 application topically as needed. To affected area. 04/21/15   Tilda Burrow, MD  traZODone (DESYREL) 100 MG tablet Take 100 mg by mouth at bedtime.    Historical Provider, MD  zonisamide (ZONEGRAN) 50 MG capsule Take 150 mg by mouth daily.    Historical Provider, MD    Family History Family History  Problem Relation Age of Onset  . Diabetes Mother   . Hypertension Mother   . Hypertension Father   . Stroke Father   . Cancer Maternal Grandmother   . Kidney disease Paternal Grandmother   . Hypertension Paternal Grandmother   . Hyperlipidemia Paternal Grandmother   . Heart disease Paternal Grandmother   . Heart attack Paternal Grandmother   . Diabetes Daughter   . Hypothyroidism Daughter   . Anxiety disorder Sister   . Anxiety disorder Brother   . Anxiety disorder Sister   . Colon cancer Neg Hx     Social History Social History  Substance Use Topics  . Smoking status: Never Smoker  . Smokeless tobacco: Never Used     Comment: Never smoker  . Alcohol use No     Allergies   Abilify [aripiprazole]; Reglan [metoclopramide]; and Dexamethasone   Review of Systems Review of Systems  Constitutional: Negative for fever.  HENT: Positive for trouble swallowing.   Gastrointestinal: Positive for vomiting.  All other systems reviewed and are negative.  Physical Exam Updated Vital Signs BP 119/85 (BP Location: Left Arm)   Pulse 94   Temp 98.1 F (36.7 C) (Oral)   Resp 16   Ht 5\' 3"  (1.6 m)   Wt 190 lb (86.2 kg)   SpO2 98%   BMI 33.66 kg/m   Physical Exam  Constitutional: She is oriented to person, place, and time. She appears well-developed and well-nourished.  Appears mildly anxious  HENT:  Head: Normocephalic and atraumatic.  Eyes: EOM are normal. Pupils are equal, round, and reactive to light.  Neck: Normal range of motion. Neck supple. No JVD present.  Cardiovascular: Normal rate, regular rhythm and normal heart sounds.   No murmur heard. Pulmonary/Chest: Effort normal and breath sounds normal. She has no wheezes. She has no  rales. She exhibits no tenderness.  Abdominal: Soft. Bowel sounds are normal. She exhibits no distension and no mass. There is no tenderness.  Musculoskeletal: Normal range of motion. She exhibits no edema.  Lymphadenopathy:    She has no cervical adenopathy.  Neurological: She is alert and oriented to person, place, and time. No cranial nerve deficit. She exhibits normal muscle tone. Coordination normal.  Skin: Skin is warm and dry. No rash noted.  Psychiatric: She has a normal mood and affect. Her behavior is normal. Judgment and thought content normal.  Nursing note and vitals reviewed.   ED Treatments / Results  DIAGNOSTIC  STUDIES:  Oxygen Saturation is 98% on A, normal by my interpretation.    COORDINATION OF CARE:  12:38 AM Discussed treatment plan with pt at bedside and pt agreed to plan.   Labs (all labs ordered are listed, but only abnormal results are displayed) Labs Reviewed  BASIC METABOLIC PANEL - Abnormal; Notable for the following:       Result Value   Sodium 133 (*)    Potassium 3.0 (*)    Chloride 99 (*)    Glucose, Bld 111 (*)    All other components within normal limits  CBC WITH DIFFERENTIAL/PLATELET  SURGICAL PATHOLOGY    Procedures Procedures (including critical care time)  Medications Ordered in ED Medications  glucagon (human recombinant) (GLUCAGEN) injection 1 mg (1 mg Intravenous Given 11/17/16 0048)     Initial Impression / Assessment and Plan / ED Course  I have reviewed the triage vital signs and the nursing notes.  Pertinent lab results that were available during my care of the patient were reviewed by me and considered in my medical decision making (see chart for details).  Esophageal obstruction from meat impaction. Old records are reviewed, and she has no relevant past visits. Of note, she does have history of she motors disease. She is given a dose of glucagon with no improvement. Case is discussed with Dr. Karilyn Cotaehman of gastroenterology  service who agrees to come in for emergency endoscopy.  Final Clinical Impressions(s) / ED Diagnoses   Final diagnoses:  Impacted esophageal foreign body, initial encounter    New Prescriptions New Prescriptions   No medications on file   I personally performed the services described in this documentation, which was scribed in my presence. The recorded information has been reviewed and is accurate.       Dione Boozeavid Ameris Akamine, MD 11/17/16 [id number]

## 2016-11-17 NOTE — ED Notes (Signed)
Patient transferred to Endo at this time

## 2016-11-17 NOTE — Discharge Instructions (Signed)
Resume usual medications and diet. Remember to chew food thoroughly before swallowing. No driving for 24 hours. Physician will call with biopsy results and further recommendations.   Upper Endoscopy, Care After Refer to this sheet in the next few weeks. These instructions provide you with information about caring for yourself after your procedure. Your health care provider may also give you more specific instructions. Your treatment has been planned according to current medical practices, but problems sometimes occur. Call your health care provider if you have any problems or questions after your procedure. What can I expect after the procedure? After the procedure, it is common to have:  A sore throat.  Bloating.  Nausea. Follow these instructions at home:  Follow instructions from your health care provider about what to eat or drink after your procedure.  Return to your normal activities as told by your health care provider. Ask your health care provider what activities are safe for you.  Take over-the-counter and prescription medicines only as told by your health care provider.  Do not drive for 24 hours if you received a sedative.  Keep all follow-up visits as told by your health care provider. This is important. Contact a health care provider if:  You have a sore throat that lasts longer than one day.  You have trouble swallowing. Get help right away if:  You have a fever.  You vomit blood or your vomit looks like coffee grounds.  You have bloody, black, or tarry stools.  You have a severe sore throat or you cannot swallow.  You have difficulty breathing.  You have severe pain in your chest or belly.  Esophagogastroduodenoscopy, Care After Introduction Refer to this sheet in the next few weeks. These instructions provide you with information about caring for yourself after your procedure. Your health care provider may also give you more specific instructions. Your  treatment has been planned according to current medical practices, but problems sometimes occur. Call your health care provider if you have any problems or questions after your procedure. What can I expect after the procedure? After the procedure, it is common to have:  A sore throat.  Nausea.  Bloating.  Dizziness.  Fatigue. Follow these instructions at home:  Do not eat or drink anything until the numbing medicine (local anesthetic) has worn off and your gag reflex has returned. You will know that the local anesthetic has worn off when you can swallow comfortably.  Do not drive for 24 hours if you received a medicine to help you relax (sedative).  If your health care provider took a tissue sample for testing during the procedure, make sure to get your test results. This is your responsibility. Ask your health care provider or the department performing the test when your results will be ready.  Keep all follow-up visits as told by your health care provider. This is important. Contact a health care provider if:  You cannot stop coughing.  You are not urinating.  You are urinating less than usual. Get help right away if:  You have trouble swallowing.  You cannot eat or drink.  You have throat or chest pain that gets worse.  You are dizzy or light-headed.  You faint.  You have nausea or vomiting.  You have chills.  You have a fever.  You have severe abdominal pain.  You have black, tarry, or bloody stools.

## 2016-11-17 NOTE — ED Triage Notes (Signed)
Pt ate a slice of pizza around 2200, while eating a piece of ham got stuck in her throat. Pt has tried water but has thrown up the water each time. Patient also tried eating something and tried to induce vomiting with no success

## 2016-11-17 NOTE — Op Note (Signed)
[facility] Patient Name: Marisa Johnson Procedure Date: 11/17/2016 2:30 AM MRN: [id number] Date of Birth: 06/07/1976 Attending MD: Lionel December , MD CSN: [id number] Age: 41 Admit Type: Outpatient Procedure:                Upper GI endoscopy Indications:              Foreign body in the esophagus Providers:                Lionel December, MD, Nena Polio, RN, Burke Keels,                            Technician Referring MD:             Dione Booze, MD Medicines:                Cetacaine spray, Meperidine 50 mg IV, Midazolam 10                            mg IV Complications:            No immediate complications. Estimated Blood Loss:     Estimated blood loss was minimal. Procedure:                Pre-Anesthesia Assessment:                           - Prior to the procedure, a History and Physical                            was performed, and patient medications and                            allergies were reviewed. The patient's tolerance of                            previous anesthesia was also reviewed. The risks                            and benefits of the procedure and the sedation                            options and risks were discussed with the patient.                            All questions were answered, and informed consent                            was obtained. Prior Anticoagulants: The patient has                            taken no previous anticoagulant or antiplatelet                            agents. ASA Grade Assessment: II - A patient with  mild systemic disease. After reviewing the risks                            and benefits, the patient was deemed in                            satisfactory condition to undergo the procedure.                           After obtaining informed consent, the endoscope was                            passed under direct vision. Throughout the                            procedure, the patient's  blood pressure, pulse, and                            oxygen saturations were monitored continuously. The                            EG-[id number] ([id number]) scope was introduced through the                            mouth, and advanced to the second part of duodenum.                            The upper GI endoscopy was accomplished without                            difficulty. The patient tolerated the procedure                            well. Scope In: 3:16:21 AM Scope Out: 3:23:46 AM Total Procedure Duration: 0 hours 7 minutes 25 seconds  Findings:      Fluid was found in the lower third of the esophagus.      The examined esophagus was normal. Biopsies were taken with a cold       forceps for histology.      The Z-line was regular and was found 35 cm from the incisors.      A medium amount of food (residue) was found in the gastric body.      The exam of the stomach was otherwise normal.      The duodenal bulb and second portion of the duodenum were normal. Impression:               - Fluid in the lower third of the esophagus.                           - Normal esophagus. Biopsied.                           - Z-line regular, 35 cm from the incisors.                           -  A medium amount of food (residue) in the stomach.                           - Normal duodenal bulb and second portion of the                            duodenum. Moderate Sedation:      Moderate (conscious) sedation was administered by the endoscopy nurse       and supervised by the endoscopist. The following parameters were       monitored: oxygen saturation, heart rate, blood pressure, CO2       capnography and response to care. Total physician intraservice time was       15 minutes. Recommendation:           - Patient has a contact number available for                            emergencies. The signs and symptoms of potential                            delayed complications were discussed with the                             patient. Return to normal activities tomorrow.                            Written discharge instructions were provided to the                            patient.                           - Resume previous diet today.                           - Continue present medications.                           - Await pathology results. Procedure Code(s):        --- Professional ---                           [phone], Esophagogastroduodenoscopy, flexible,                            transoral; with biopsy, single or multiple                           99152, Moderate sedation services provided by the                            same physician or other qualified health care                            professional performing the diagnostic or  therapeutic service that the sedation supports,                            requiring the presence of an independent trained                            observer to assist in the monitoring of the                            patient's level of consciousness and physiological                            status; initial 15 minutes of intraservice time,                            patient age 1 years or older Diagnosis Code(s):        --- Professional ---                           T18.108A, Unspecified foreign body in esophagus                            causing other injury, initial encounter CPT copyright 2016 American Medical Association. All rights reserved. The codes documented in this report are preliminary and upon coder review may  be revised to meet current compliance requirements. Lionel DecemberNajeeb Julisa Flippo, MD Lionel DecemberNajeeb Shalayne Leach, MD 11/17/2016 3:36:58 AM This report has been signed electronically. Number of Addenda: 0

## 2016-11-17 NOTE — H&P (Signed)
Marisa Johnson is an 41 y.o. female.   Chief Complaint: Patient is here for EGD with foreign body removal and esophageal dilation. HPI: Patient is 41 year old Caucasian female not in working tonight and while eating he sent him she developed dysphagia. She works in emergency room as an Therapist, sports. She was given glucagon without any benefit. She feels food bolus stuck it level of suprasternal area. She denies chronic heartburn. She's had difficulty swallowing for about a month's time.. She had it last year which she got better. She felt it could've been due to goiter. She does not have problem with skin allergy. She denies nausea vomiting hematemesis or melena.  Past Medical History:  Diagnosis Date  . Anxiety   . Bipolar disorder ([place])   . Carpal tunnel syndrome   . Contraceptive management 08/09/2015  . Decreased libido 08/09/2015  . Friable cervix 08/09/2015  . History of abnormal cervical Pap smear 08/09/2015  . IBS (irritable bowel syndrome)   . Irregular intermenstrual bleeding 08/09/2015  . Major depressive disorder   . Migraine   . Pseudobulbar affect   . PTSD (post-traumatic stress disorder)   . Thyroid disease   . Vaginal Pap smear, abnormal     Past Surgical History:  Procedure Laterality Date  . None      Family History  Problem Relation Age of Onset  . Diabetes Mother   . Hypertension Mother   . Hypertension Father   . Stroke Father   . Cancer Maternal Grandmother   . Kidney disease Paternal Grandmother   . Hypertension Paternal Grandmother   . Hyperlipidemia Paternal Grandmother   . Heart disease Paternal Grandmother   . Heart attack Paternal Grandmother   . Diabetes Daughter   . Hypothyroidism Daughter   . Anxiety disorder Sister   . Anxiety disorder Brother   . Anxiety disorder Sister   . Colon cancer Neg Hx    Social History:  reports that she has never smoked. She has never used smokeless tobacco. She reports that she does not drink alcohol or use  drugs.  Allergies:  Allergies  Allergen Reactions  . Abilify [Aripiprazole] Other (See Comments)    Causes restless leg  . Reglan [Metoclopramide]     hallucinations  . Dexamethasone Rash    Medications Prior to Admission  Medication Sig Dispense Refill  . buPROPion (WELLBUTRIN XL) 300 MG 24 hr tablet Take 300 mg by mouth at bedtime.    . clonazePAM (KLONOPIN) 0.5 MG tablet Take 0.5 mg by mouth at bedtime as needed for anxiety.     . cyclobenzaprine (FLEXERIL) 10 MG tablet Take 10 mg by mouth 3 (three) times daily as needed for muscle spasms.    Marland Kitchen gabapentin (NEURONTIN) 600 MG tablet Take 600 mg by mouth 3 (three) times daily.    Marland Kitchen lamoTRIgine (LAMICTAL) 200 MG tablet Take 200 mg by mouth at bedtime.    Marland Kitchen levothyroxine (SYNTHROID, LEVOTHROID) 112 MCG tablet Take 125 mcg by mouth daily before breakfast.     . traMADol (ULTRAM) 50 MG tablet Take 100 mg by mouth every 6 (six) hours as needed.    . traZODone (DESYREL) 100 MG tablet Take 100 mg by mouth at bedtime.    . baclofen (LIORESAL) 10 MG tablet Take 10 mg by mouth 2 (two) times daily as needed for muscle spasms (limit 1-2 treatments per week).    . Linaclotide (LINZESS) 290 MCG CAPS capsule Take 1 capsule (290 mcg total) by mouth daily. Take 30  minutes before breakfast. (Patient taking differently: Take 290 mcg by mouth daily. Prn) 30 capsule 11  . LO LOESTRIN FE 1 MG-10 MCG / 10 MCG tablet TAKE 1 TABLET BY MOUTH DAILY. 28 tablet PRN  . lurasidone (LATUDA) 20 MG TABS tablet Take 20 mg by mouth at bedtime.    . meclizine (ANTIVERT) 25 MG tablet Take 25 mg by mouth 3 (three) times daily as needed for dizziness.    . nystatin-triamcinolone ointment (MYCOLOG) Apply 1 application topically 2 (two) times daily. To affected area. (Patient taking differently: Apply 1 application topically as needed. To affected area.) 30 g prn  . zonisamide (ZONEGRAN) 50 MG capsule Take 150 mg by mouth daily.      Results for orders placed or performed  during the hospital encounter of 11/17/16 (from the past 48 hour(s))  Basic metabolic panel     Status: Abnormal   Collection Time: 11/17/16 12:57 AM  Result Value Ref Range   Sodium 133 (L) 135 - 145 mmol/L   Potassium 3.0 (L) 3.5 - 5.1 mmol/L   Chloride 99 (L) 101 - 111 mmol/L   CO2 25 22 - 32 mmol/L   Glucose, Bld 111 (H) 65 - 99 mg/dL   BUN 11 6 - 20 mg/dL   Creatinine, Ser 0.85 0.44 - 1.00 mg/dL   Calcium 8.9 8.9 - 10.3 mg/dL   GFR calc non Af Amer >60 >60 mL/min   GFR calc Af Amer >60 >60 mL/min    Comment: (NOTE) The eGFR has been calculated using the CKD EPI equation. This calculation has not been validated in all clinical situations. eGFR's persistently <60 mL/min signify possible Chronic Kidney Disease.    Anion gap 9 5 - 15  CBC with Differential     Status: None   Collection Time: 11/17/16 12:57 AM  Result Value Ref Range   WBC 8.0 4.0 - 10.5 K/uL   RBC 4.21 3.87 - 5.11 MIL/uL   Hemoglobin 12.8 12.0 - 15.0 g/dL   HCT 38.1 36.0 - 46.0 %   MCV 90.5 78.0 - 100.0 fL   MCH 30.4 26.0 - 34.0 pg   MCHC 33.6 30.0 - 36.0 g/dL   RDW 12.9 11.5 - 15.5 %   Platelets 243 150 - 400 K/uL   Neutrophils Relative % 43 %   Neutro Abs 3.4 1.7 - 7.7 K/uL   Lymphocytes Relative 48 %   Lymphs Abs 3.8 0.7 - 4.0 K/uL   Monocytes Relative 7 %   Monocytes Absolute 0.6 0.1 - 1.0 K/uL   Eosinophils Relative 2 %   Eosinophils Absolute 0.2 0.0 - 0.7 K/uL   Basophils Relative 0 %   Basophils Absolute 0.0 0.0 - 0.1 K/uL   No results found.  ROS  Blood pressure 119/85, pulse 94, temperature 98.1 F (36.7 C), temperature source Oral, resp. rate 16, height _0  (1.6 m), weight 190 lb (86.2 kg), SpO2 98 %. Physical Exam  Constitutional: She appears well-developed and well-nourished.  HENT:  Mouth/Throat: Oropharynx is clear and moist.  Eyes: Conjunctivae are normal.  Neck: No thyromegaly present.  Cardiovascular: Normal rate, regular rhythm and normal heart sounds.   No murmur  heard. Respiratory: Effort normal and breath sounds normal.  GI: Soft. She exhibits no distension and no mass. There is no tenderness.  Musculoskeletal: She exhibits no edema.  Lymphadenopathy:    She has no cervical adenopathy.  Neurological: She is alert.  Skin: Skin is warm and dry.  Assessment/Plan Esophageal foreign body. EGD with foreign body removal and esophageal dilation.  Hildred Laser, MD 11/17/2016, 3:00 AM

## 2016-11-18 ENCOUNTER — Telehealth (INDEPENDENT_AMBULATORY_CARE_PROVIDER_SITE_OTHER): Payer: Self-pay | Admitting: Internal Medicine

## 2016-11-18 NOTE — Telephone Encounter (Signed)
Patient called, stated that she had a procedure done after midnight yesterday.  She wants to know if she should still be having pain and spasms in that area.  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  [phone]

## 2016-11-19 DIAGNOSIS — Z6834 Body mass index (BMI) 34.0-34.9, adult: Secondary | ICD-10-CM | POA: Diagnosis not present

## 2016-11-19 DIAGNOSIS — E039 Hypothyroidism, unspecified: Secondary | ICD-10-CM | POA: Diagnosis not present

## 2016-11-19 DIAGNOSIS — G9009 Other idiopathic peripheral autonomic neuropathy: Secondary | ICD-10-CM | POA: Diagnosis not present

## 2016-11-20 NOTE — Telephone Encounter (Signed)
Patient states the spasms have resolved. No problems

## 2016-11-25 ENCOUNTER — Encounter (INDEPENDENT_AMBULATORY_CARE_PROVIDER_SITE_OTHER): Payer: Self-pay | Admitting: *Deleted

## 2016-11-25 ENCOUNTER — Other Ambulatory Visit (INDEPENDENT_AMBULATORY_CARE_PROVIDER_SITE_OTHER): Payer: Self-pay | Admitting: *Deleted

## 2016-11-25 DIAGNOSIS — E669 Obesity, unspecified: Secondary | ICD-10-CM | POA: Diagnosis not present

## 2016-11-25 DIAGNOSIS — M791 Myalgia: Secondary | ICD-10-CM | POA: Diagnosis not present

## 2016-11-25 DIAGNOSIS — R5382 Chronic fatigue, unspecified: Secondary | ICD-10-CM | POA: Diagnosis not present

## 2016-11-25 DIAGNOSIS — M2550 Pain in unspecified joint: Secondary | ICD-10-CM | POA: Diagnosis not present

## 2016-11-25 DIAGNOSIS — Z6834 Body mass index (BMI) 34.0-34.9, adult: Secondary | ICD-10-CM | POA: Diagnosis not present

## 2016-11-25 DIAGNOSIS — R1319 Other dysphagia: Secondary | ICD-10-CM

## 2016-12-02 ENCOUNTER — Ambulatory Visit (HOSPITAL_COMMUNITY)
Admission: RE | Admit: 2016-12-02 | Discharge: 2016-12-02 | Disposition: A | Discharge status: Home / Self Care | Payer: 59 | Source: Ambulatory Visit | Attending: Internal Medicine | Admitting: Internal Medicine

## 2016-12-02 DIAGNOSIS — R1310 Dysphagia, unspecified: Secondary | ICD-10-CM | POA: Diagnosis not present

## 2016-12-02 DIAGNOSIS — R1319 Other dysphagia: Secondary | ICD-10-CM | POA: Insufficient documentation

## 2016-12-02 DIAGNOSIS — K224 Dyskinesia of esophagus: Secondary | ICD-10-CM | POA: Diagnosis not present

## 2016-12-05 ENCOUNTER — Telehealth (INDEPENDENT_AMBULATORY_CARE_PROVIDER_SITE_OTHER): Payer: Self-pay | Admitting: Internal Medicine

## 2016-12-05 NOTE — Telephone Encounter (Signed)
Patient left a message after hours last night, stated that she had just spoke with Dr. Karilyn Cotaehman and needed to tell him something about a recent doctors visit and another one coming up this week.  She wanted to tell him some other things that are going on that may be related to her issue of swallowing.  [phone]

## 2016-12-06 ENCOUNTER — Encounter (HOSPITAL_COMMUNITY): Payer: Self-pay | Admitting: Internal Medicine

## 2016-12-09 ENCOUNTER — Other Ambulatory Visit (INDEPENDENT_AMBULATORY_CARE_PROVIDER_SITE_OTHER): Payer: Self-pay | Admitting: Internal Medicine

## 2016-12-09 DIAGNOSIS — R1312 Dysphagia, oropharyngeal phase: Secondary | ICD-10-CM

## 2016-12-10 ENCOUNTER — Other Ambulatory Visit (HOSPITAL_COMMUNITY): Payer: Self-pay | Admitting: Specialist

## 2016-12-10 DIAGNOSIS — M3500 Sicca syndrome, unspecified: Secondary | ICD-10-CM | POA: Diagnosis not present

## 2016-12-10 DIAGNOSIS — M791 Myalgia: Secondary | ICD-10-CM | POA: Diagnosis not present

## 2016-12-10 DIAGNOSIS — Z6834 Body mass index (BMI) 34.0-34.9, adult: Secondary | ICD-10-CM | POA: Diagnosis not present

## 2016-12-10 DIAGNOSIS — R5382 Chronic fatigue, unspecified: Secondary | ICD-10-CM | POA: Diagnosis not present

## 2016-12-10 DIAGNOSIS — M2550 Pain in unspecified joint: Secondary | ICD-10-CM | POA: Diagnosis not present

## 2016-12-10 DIAGNOSIS — E669 Obesity, unspecified: Secondary | ICD-10-CM | POA: Diagnosis not present

## 2016-12-11 ENCOUNTER — Other Ambulatory Visit (HOSPITAL_COMMUNITY): Payer: Self-pay | Admitting: Specialist

## 2016-12-11 DIAGNOSIS — R1319 Other dysphagia: Secondary | ICD-10-CM

## 2016-12-11 NOTE — Telephone Encounter (Signed)
Talked with the patient. She states that Dr.Rehman stated he was going to [facility]. In our notes , he ask that we refer her to Ms Hale Bogusorter, and this has been done. If, the dysphagia continued , we would precede with the Esophageal Manometry.

## 2016-12-17 ENCOUNTER — Encounter (HOSPITAL_COMMUNITY): Payer: Self-pay | Admitting: Speech Pathology

## 2016-12-23 ENCOUNTER — Ambulatory Visit (HOSPITAL_COMMUNITY): Payer: 59 | Admitting: Speech Pathology

## 2016-12-23 ENCOUNTER — Inpatient Hospital Stay (HOSPITAL_COMMUNITY): Admission: RE | Admit: 2016-12-23 | Payer: Self-pay | Source: Ambulatory Visit

## 2017-01-06 ENCOUNTER — Encounter (HOSPITAL_COMMUNITY): Payer: Self-pay | Admitting: Speech Pathology

## 2017-01-06 ENCOUNTER — Ambulatory Visit (HOSPITAL_COMMUNITY)
Admission: RE | Admit: 2017-01-06 | Discharge: 2017-01-06 | Disposition: A | Discharge status: Home / Self Care | Payer: 59 | Source: Ambulatory Visit | Attending: Internal Medicine | Admitting: Internal Medicine

## 2017-01-06 ENCOUNTER — Ambulatory Visit (HOSPITAL_COMMUNITY): Payer: 59 | Attending: Internal Medicine | Admitting: Speech Pathology

## 2017-01-06 DIAGNOSIS — R1312 Dysphagia, oropharyngeal phase: Secondary | ICD-10-CM | POA: Diagnosis not present

## 2017-01-06 DIAGNOSIS — R1319 Other dysphagia: Secondary | ICD-10-CM | POA: Insufficient documentation

## 2017-01-06 NOTE — Therapy (Signed)
[facility] [address] [place], [place], [address] Phone: [phone]   Fax:  [phone]  Modified Barium Swallow  Patient Details  Name: Marisa Johnson MRN: [id number] Date of Birth: April 23, 1976 No Data Recorded  Encounter Date: 01/06/2017      End of Session - 01/06/17 1647    Visit Number 1   Number of Visits 1   Authorization Type [place]   SLP Start Time 1300   SLP Stop Time  1345   SLP Time Calculation (min) 45 min   Activity Tolerance Patient tolerated treatment well      Past Medical History:  Diagnosis Date  . Anxiety   . Bipolar disorder (HCC)   . Carpal tunnel syndrome   . Contraceptive management 08/09/2015  . Decreased libido 08/09/2015  . Friable cervix 08/09/2015  . History of abnormal cervical Pap smear 08/09/2015  . IBS (irritable bowel syndrome)   . Irregular intermenstrual bleeding 08/09/2015  . Major depressive disorder   . Migraine   . Pseudobulbar affect   . PTSD (post-traumatic stress disorder)   . Thyroid disease   . Vaginal Pap smear, abnormal     Past Surgical History:  Procedure Laterality Date  . ESOPHAGOGASTRODUODENOSCOPY N/A 11/17/2016   Procedure: ESOPHAGOGASTRODUODENOSCOPY (EGD);  Surgeon: Malissa Hippo, MD;  [facility];  Service: Endoscopy;  Laterality: N/A;  . None      There were no vitals filed for this visit.      Subjective Assessment - 01/06/17 1644    Subjective "I got choked on a piece of ham from some pizza."   Special Tests MBSS   Currently in Pain? No/denies             General - 01/06/17 1645      General Information   HPI  Marisa Johnson is a 41 yo female who was referred by Dr. Karilyn Cota for MBSS due to Pt c/o dysphagia intermittently for the past two months. Pt tells SLP that she has "always had trouble swallowing certain foods" and thought it was normal (family history of this). On 11/17/2016 while at work, she consumed a piece of ham which  she felt became lodged in her throat. She tried eating and drinking to relieve symptoms, however this resulted in regurgitation. Pt was given glucagon without relief. She underwent emergent EGD that night. Pt denies history of GERD, but does endorse hypothyroidism, h/o goiter, and bilateral leg numbness/tingling at times for which she is being followed by neurology.  She had a barium swallow on 12/02/2016 which demonstrated: Targeted rapid sequence imaging of the cervical esophagus and hypopharynx demonstrated laryngeal penetration with a small amount of contrast material reaching to the level of the vocal cords but not below. No aspiration seen or spontaneous cough reflex identified. Contrast cleared by a repeat swallow and clearing of throat. Mild diffuse esophageal dysmotility with incomplete clearance of barium by primary peristaltic waves and note of scattered secondary waves and intermittent retrograde peristalsis. MRI Brain was negative for acute event in September 2017 and MRI CERVICAL SPINE: Small C6-7 disc protrusion without canal stenosis or neural foraminal narrowing any level. Please see attached list of medications. Pt has not been evaluated by ENT. She works in emergency room as an Charity fundraiser.    Type of Study MBS-Modified Barium Swallow Study   Diet Prior to this Study Regular;Thin liquids   Temperature Spikes Noted No   Respiratory Status Room air   History of  Recent Intubation No   Behavior/Cognition Alert;Cooperative;Pleasant mood   Oral Cavity Assessment Within Functional Limits   Oral Care Completed by SLP No   Oral Cavity - Dentition Adequate natural dentition   Vision Functional for self feeding   Self-Feeding Abilities Able to feed self   Patient Positioning Upright in chair   Baseline Vocal Quality Normal   Volitional Cough Strong   Volitional Swallow Able to elicit   Anatomy Within functional limits   Pharyngeal Secretions Not observed secondary MBS            Oral  Preparation/Oral Phase - 01/06/17 1647      Oral Preparation/Oral Phase   Oral Phase Within functional limits          Pharyngeal Phase - 01/06/17 1647      Pharyngeal Phase   Pharyngeal Phase Within functional limits          Cricopharyngeal Phase - 01/06/17 1647      Cervical Esophageal Phase   Cervical Esophageal Phase Within functional limits           Plan - 01/06/17 1648    Clinical Impression Statement Pt assessed in the lateral position and presented with barium tinged thin via cup/straw sips, puree, regular textures, and barium tablet with thin liquids. Oropharyngeal swallow appears WFL without penetration/aspiration or residuals post swallow. Pt with swallow trigger as liquids spill to pyriforms (appears timely). Oral motor evaluation completed and pt without deficits in strength, ROM, or coordination. This study was reviewed with Pt and she was able to see imaging. If patient's symptoms persist or if further work up desired, recommend ENT to visualize larynx and pharynx (could potentially refer for FEES as well). No further SLP services indicated at this time.    Consulted and Agree with Plan of Care Patient      Patient will benefit from skilled therapeutic intervention in order to improve the following deficits and impairments:   Dysphagia, oropharyngeal phase        Recommendations/Treatment - 01/06/17 1647      Swallow Evaluation Recommendations   SLP Diet Recommendations Thin;Age appropriate regular   Liquid Administration via Cup;Straw   Medication Administration Whole meds with liquid   Supervision Patient able to self feed   Postural Changes Seated upright at 90 degrees;Remain upright for at least 30 minutes after feeds/meals          Prognosis - 01/06/17 1647      Prognosis   Prognosis for Safe Diet Advancement Good     Individuals Consulted   Consulted and Agree with Results and Recommendations Patient   Report Sent to  Referring  physician      Problem List Patient Active Problem List   Diagnosis Date Noted  . Irregular intermenstrual bleeding 08/09/2015  . Decreased libido 08/09/2015  . Friable cervix 08/09/2015  . Contraceptive management 08/09/2015  . History of abnormal cervical Pap smear 08/09/2015  . Abdominal pain, left lower quadrant 09/21/2013  . Nausea and vomiting 09/20/2013  . IRRITABLE BOWEL SYNDROME 05/29/2010  . CLOSED FRACTURE OF HEAD OF RADIUS 04/13/2008  . FX CLOSED FIBULA NOS 04/13/2008   Thank you,  Havery MorosDabney Porter, CCC-SLP [phone]  [facility],DABNEY 01/06/2017, 4:49 PM  [facility] [address] [place], [place], [address] Phone: [phone]   Fax:  [phone]  Name: Marisa Johnson MRN: [id number] Date of Birth: November 01, 1975

## 2017-01-14 DIAGNOSIS — F3181 Bipolar II disorder: Secondary | ICD-10-CM | POA: Diagnosis not present

## 2017-01-14 DIAGNOSIS — E039 Hypothyroidism, unspecified: Secondary | ICD-10-CM | POA: Diagnosis not present

## 2017-01-21 DIAGNOSIS — E039 Hypothyroidism, unspecified: Secondary | ICD-10-CM | POA: Diagnosis not present

## 2017-02-05 DIAGNOSIS — G609 Hereditary and idiopathic neuropathy, unspecified: Secondary | ICD-10-CM | POA: Diagnosis not present

## 2017-02-05 DIAGNOSIS — R269 Unspecified abnormalities of gait and mobility: Secondary | ICD-10-CM | POA: Diagnosis not present

## 2017-02-05 DIAGNOSIS — E539 Vitamin B deficiency, unspecified: Secondary | ICD-10-CM | POA: Diagnosis not present

## 2017-02-05 DIAGNOSIS — M797 Fibromyalgia: Secondary | ICD-10-CM | POA: Diagnosis not present

## 2017-02-05 DIAGNOSIS — E039 Hypothyroidism, unspecified: Secondary | ICD-10-CM | POA: Diagnosis not present

## 2017-02-05 DIAGNOSIS — R5383 Other fatigue: Secondary | ICD-10-CM | POA: Diagnosis not present

## 2017-02-25 DIAGNOSIS — E039 Hypothyroidism, unspecified: Secondary | ICD-10-CM | POA: Diagnosis not present

## 2017-02-25 DIAGNOSIS — Z6832 Body mass index (BMI) 32.0-32.9, adult: Secondary | ICD-10-CM | POA: Diagnosis not present

## 2017-02-25 DIAGNOSIS — F5101 Primary insomnia: Secondary | ICD-10-CM | POA: Diagnosis not present

## 2017-02-25 DIAGNOSIS — F339 Major depressive disorder, recurrent, unspecified: Secondary | ICD-10-CM | POA: Diagnosis not present

## 2017-02-25 DIAGNOSIS — R202 Paresthesia of skin: Secondary | ICD-10-CM | POA: Diagnosis not present

## 2017-02-27 ENCOUNTER — Ambulatory Visit (HOSPITAL_COMMUNITY)
Admission: RE | Admit: 2017-02-27 | Discharge: 2017-02-27 | Disposition: A | Discharge status: Home / Self Care | Payer: PRIVATE HEALTH INSURANCE | Source: Ambulatory Visit | Attending: Family Medicine | Admitting: Family Medicine

## 2017-02-27 ENCOUNTER — Other Ambulatory Visit (HOSPITAL_COMMUNITY): Payer: Self-pay | Admitting: Family Medicine

## 2017-02-27 DIAGNOSIS — S8991XA Unspecified injury of right lower leg, initial encounter: Secondary | ICD-10-CM | POA: Diagnosis not present

## 2017-02-27 DIAGNOSIS — S8992XA Unspecified injury of left lower leg, initial encounter: Secondary | ICD-10-CM | POA: Diagnosis present

## 2017-02-27 DIAGNOSIS — W19XXXA Unspecified fall, initial encounter: Secondary | ICD-10-CM

## 2017-02-27 DIAGNOSIS — M25562 Pain in left knee: Secondary | ICD-10-CM | POA: Diagnosis not present

## 2017-02-27 DIAGNOSIS — M25561 Pain in right knee: Secondary | ICD-10-CM | POA: Diagnosis not present

## 2017-03-05 DIAGNOSIS — F3181 Bipolar II disorder: Secondary | ICD-10-CM | POA: Diagnosis not present

## 2017-03-15 ENCOUNTER — Encounter (HOSPITAL_COMMUNITY): Payer: Self-pay | Admitting: Emergency Medicine

## 2017-03-15 ENCOUNTER — Emergency Department (HOSPITAL_COMMUNITY): Payer: 59

## 2017-03-15 ENCOUNTER — Inpatient Hospital Stay (HOSPITAL_COMMUNITY)
Admission: EM | Admit: 2017-03-15 | Discharge: 2017-03-20 | DRG: 196 | Disposition: A | Discharge status: Home / Self Care | Payer: 59 | Attending: Internal Medicine | Admitting: Internal Medicine

## 2017-03-15 DIAGNOSIS — M3502 Sicca syndrome with lung involvement: Secondary | ICD-10-CM

## 2017-03-15 DIAGNOSIS — J45901 Unspecified asthma with (acute) exacerbation: Secondary | ICD-10-CM | POA: Diagnosis present

## 2017-03-15 DIAGNOSIS — Z888 Allergy status to other drugs, medicaments and biological substances status: Secondary | ICD-10-CM

## 2017-03-15 DIAGNOSIS — J189 Pneumonia, unspecified organism: Secondary | ICD-10-CM

## 2017-03-15 DIAGNOSIS — K589 Irritable bowel syndrome without diarrhea: Secondary | ICD-10-CM | POA: Diagnosis present

## 2017-03-15 DIAGNOSIS — G4700 Insomnia, unspecified: Secondary | ICD-10-CM | POA: Diagnosis present

## 2017-03-15 DIAGNOSIS — R0902 Hypoxemia: Secondary | ICD-10-CM | POA: Diagnosis not present

## 2017-03-15 DIAGNOSIS — R071 Chest pain on breathing: Secondary | ICD-10-CM

## 2017-03-15 DIAGNOSIS — I4581 Long QT syndrome: Secondary | ICD-10-CM | POA: Diagnosis present

## 2017-03-15 DIAGNOSIS — R05 Cough: Secondary | ICD-10-CM | POA: Diagnosis not present

## 2017-03-15 DIAGNOSIS — E8770 Fluid overload, unspecified: Secondary | ICD-10-CM | POA: Diagnosis present

## 2017-03-15 DIAGNOSIS — Z79899 Other long term (current) drug therapy: Secondary | ICD-10-CM | POA: Diagnosis not present

## 2017-03-15 DIAGNOSIS — R079 Chest pain, unspecified: Secondary | ICD-10-CM | POA: Diagnosis not present

## 2017-03-15 DIAGNOSIS — F319 Bipolar disorder, unspecified: Secondary | ICD-10-CM | POA: Diagnosis present

## 2017-03-15 DIAGNOSIS — F4310 Post-traumatic stress disorder, unspecified: Secondary | ICD-10-CM | POA: Diagnosis present

## 2017-03-15 DIAGNOSIS — J811 Chronic pulmonary edema: Secondary | ICD-10-CM | POA: Diagnosis present

## 2017-03-15 DIAGNOSIS — J849 Interstitial pulmonary disease, unspecified: Principal | ICD-10-CM | POA: Diagnosis present

## 2017-03-15 DIAGNOSIS — J9600 Acute respiratory failure, unspecified whether with hypoxia or hypercapnia: Secondary | ICD-10-CM | POA: Diagnosis not present

## 2017-03-15 DIAGNOSIS — R0602 Shortness of breath: Secondary | ICD-10-CM | POA: Diagnosis not present

## 2017-03-15 DIAGNOSIS — M3500 Sicca syndrome, unspecified: Secondary | ICD-10-CM | POA: Diagnosis present

## 2017-03-15 DIAGNOSIS — E039 Hypothyroidism, unspecified: Secondary | ICD-10-CM | POA: Diagnosis present

## 2017-03-15 DIAGNOSIS — J4541 Moderate persistent asthma with (acute) exacerbation: Secondary | ICD-10-CM

## 2017-03-15 DIAGNOSIS — J9601 Acute respiratory failure with hypoxia: Secondary | ICD-10-CM | POA: Diagnosis present

## 2017-03-15 LAB — CBC WITH DIFFERENTIAL/PLATELET
BASOS ABS: 0 10*3/uL (ref 0.0–0.1)
BASOS PCT: 0 %
EOS PCT: 3 %
Eosinophils Absolute: 0.4 10*3/uL (ref 0.0–0.7)
HCT: 39.3 % (ref 36.0–46.0)
Hemoglobin: 12.9 g/dL (ref 12.0–15.0)
Lymphocytes Relative: 13 %
Lymphs Abs: 1.7 10*3/uL (ref 0.7–4.0)
MCH: 29.3 pg (ref 26.0–34.0)
MCHC: 32.8 g/dL (ref 30.0–36.0)
MCV: 89.3 fL (ref 78.0–100.0)
MONO ABS: 0.4 10*3/uL (ref 0.1–1.0)
Monocytes Relative: 3 %
NEUTROS ABS: 10.8 10*3/uL — AB (ref 1.7–7.7)
Neutrophils Relative %: 81 %
Platelets: 218 10*3/uL (ref 150–400)
RBC: 4.4 MIL/uL (ref 3.87–5.11)
RDW: 13.1 % (ref 11.5–15.5)
WBC: 13.3 10*3/uL — ABNORMAL HIGH (ref 4.0–10.5)

## 2017-03-15 LAB — BRAIN NATRIURETIC PEPTIDE: B NATRIURETIC PEPTIDE 5: 41 pg/mL (ref 0.0–100.0)

## 2017-03-15 LAB — COMPREHENSIVE METABOLIC PANEL
ALBUMIN: 4.3 g/dL (ref 3.5–5.0)
ALK PHOS: 71 U/L (ref 38–126)
ALT: 33 U/L (ref 14–54)
ANION GAP: 8 (ref 5–15)
AST: 31 U/L (ref 15–41)
BILIRUBIN TOTAL: 0.9 mg/dL (ref 0.3–1.2)
BUN: 8 mg/dL (ref 6–20)
CALCIUM: 9.1 mg/dL (ref 8.9–10.3)
CO2: 25 mmol/L (ref 22–32)
Chloride: 102 mmol/L (ref 101–111)
Creatinine, Ser: 1.13 mg/dL — ABNORMAL HIGH (ref 0.44–1.00)
GFR calc Af Amer: >60 mL/min (ref >60)
GFR, EST NON AFRICAN AMERICAN: 60 mL/min — AB (ref >60)
GLUCOSE: 99 mg/dL (ref 65–99)
Potassium: 3.3 mmol/L — ABNORMAL LOW (ref 3.5–5.1)
Sodium: 135 mmol/L (ref 135–145)
TOTAL PROTEIN: 7.3 g/dL (ref 6.5–8.1)

## 2017-03-15 LAB — CBC
HCT: 36.4 % (ref 36.0–46.0)
Hemoglobin: 11.9 g/dL — ABNORMAL LOW (ref 12.0–15.0)
MCH: 29.6 pg (ref 26.0–34.0)
MCHC: 32.7 g/dL (ref 30.0–36.0)
MCV: 90.5 fL (ref 78.0–100.0)
Platelets: 172 10*3/uL (ref 150–400)
RBC: 4.02 MIL/uL (ref 3.87–5.11)
RDW: 13.3 % (ref 11.5–15.5)
WBC: 13.2 10*3/uL — ABNORMAL HIGH (ref 4.0–10.5)

## 2017-03-15 LAB — URINALYSIS, ROUTINE W REFLEX MICROSCOPIC
Bacteria, UA: NONE SEEN
Bilirubin Urine: NEGATIVE
GLUCOSE, UA: NEGATIVE mg/dL
Ketones, ur: NEGATIVE mg/dL
Leukocytes, UA: NEGATIVE
NITRITE: NEGATIVE
Protein, ur: NEGATIVE mg/dL
SPECIFIC GRAVITY, URINE: 1.005 (ref 1.005–1.030)
Squamous Epithelial / LPF: NONE SEEN
pH: 5 (ref 5.0–8.0)

## 2017-03-15 LAB — CREATININE, SERUM
CREATININE: 1.11 mg/dL — AB (ref 0.44–1.00)
GFR calc Af Amer: >60 mL/min (ref >60)

## 2017-03-15 LAB — LACTIC ACID, PLASMA
Lactic Acid, Venous: 1.6 mmol/L (ref 0.5–1.9)
Lactic Acid, Venous: 8.7 mmol/L (ref 0.5–1.9)

## 2017-03-15 LAB — TROPONIN I
Troponin I: <0.03 ng/mL (ref <0.03)
Troponin I: <0.03 ng/mL (ref <0.03)

## 2017-03-15 LAB — PREGNANCY, URINE: Preg Test, Ur: NEGATIVE

## 2017-03-15 LAB — D-DIMER, QUANTITATIVE: D-Dimer, Quant: 0.3 ug/mL-FEU (ref 0.00–0.50)

## 2017-03-15 LAB — MAGNESIUM: Magnesium: 1.9 mg/dL (ref 1.7–2.4)

## 2017-03-15 MED ORDER — IOPAMIDOL (ISOVUE-370) INJECTION 76%: 100.0000 mL | Freq: Once | INTRAVENOUS | Status: DC | PRN

## 2017-03-15 MED ORDER — MORPHINE SULFATE (PF) 2 MG/ML IV SOLN
1.0000 mg | INTRAVENOUS | Status: DC | PRN
  Administered 2017-03-16: 1 mg via INTRAVENOUS
  Filled 2017-03-15: qty 1

## 2017-03-15 MED ORDER — ALBUTEROL (5 MG/ML) CONTINUOUS INHALATION SOLN
10.0000 mg/h | INHALATION_SOLUTION | Freq: Once | RESPIRATORY_TRACT | Status: AC
  Administered 2017-03-15: 10 mg/h via RESPIRATORY_TRACT
  Filled 2017-03-15: qty 20

## 2017-03-15 MED ORDER — METHYLPREDNISOLONE SODIUM SUCC 125 MG IJ SOLR
60.0000 mg | Freq: Four times a day (QID) | INTRAMUSCULAR | Status: DC
  Administered 2017-03-15 – 2017-03-18 (×10): 60 mg via INTRAVENOUS
  Filled 2017-03-15 (×10): qty 2

## 2017-03-15 MED ORDER — ZOLPIDEM TARTRATE 5 MG PO TABS
5.0000 mg | ORAL_TABLET | Freq: Every evening | ORAL | Status: DC | PRN
  Administered 2017-03-16 – 2017-03-19 (×5): 5 mg via ORAL
  Filled 2017-03-15 (×5): qty 1

## 2017-03-15 MED ORDER — ENOXAPARIN SODIUM 40 MG/0.4ML ~~LOC~~ SOLN
40.0000 mg | SUBCUTANEOUS | Status: DC
  Administered 2017-03-15 – 2017-03-19 (×5): 40 mg via SUBCUTANEOUS
  Filled 2017-03-15 (×5): qty 0.4

## 2017-03-15 MED ORDER — ACETAMINOPHEN 325 MG PO TABS
650.0000 mg | ORAL_TABLET | Freq: Four times a day (QID) | ORAL | Status: DC | PRN
  Administered 2017-03-17: 650 mg via ORAL
  Filled 2017-03-15: qty 2

## 2017-03-15 MED ORDER — CEFEPIME HCL 1 G IJ SOLR
1.0000 g | Freq: Once | INTRAMUSCULAR | Status: AC
  Administered 2017-03-15: 1 g via INTRAVENOUS
  Filled 2017-03-15: qty 1

## 2017-03-15 MED ORDER — BUPROPION HCL ER (XL) 300 MG PO TB24
300.0000 mg | ORAL_TABLET | Freq: Every day | ORAL | Status: DC
  Administered 2017-03-15 – 2017-03-19 (×5): 300 mg via ORAL
  Filled 2017-03-15: qty 2
  Filled 2017-03-15 (×3): qty 1
  Filled 2017-03-15: qty 2
  Filled 2017-03-15: qty 1
  Filled 2017-03-15: qty 2

## 2017-03-15 MED ORDER — SODIUM CHLORIDE 0.9 % IV SOLN
INTRAVENOUS | Status: DC
  Administered 2017-03-15: 23:00:00 via INTRAVENOUS
  Administered 2017-03-16: 1000 mL via INTRAVENOUS
  Administered 2017-03-16 – 2017-03-19 (×6): via INTRAVENOUS

## 2017-03-15 MED ORDER — POTASSIUM CHLORIDE CRYS ER 20 MEQ PO TBCR
20.0000 meq | EXTENDED_RELEASE_TABLET | Freq: Once | ORAL | Status: AC
  Administered 2017-03-15: 20 meq via ORAL
  Filled 2017-03-15: qty 1

## 2017-03-15 MED ORDER — VANCOMYCIN HCL 10 G IV SOLR
INTRAVENOUS | Status: AC
  Filled 2017-03-15: qty 1500

## 2017-03-15 MED ORDER — SODIUM CHLORIDE 0.9 % IV BOLUS (SEPSIS)
1000.0000 mL | Freq: Once | INTRAVENOUS | Status: AC
  Administered 2017-03-15: 1000 mL via INTRAVENOUS

## 2017-03-15 MED ORDER — METHYLPREDNISOLONE SODIUM SUCC 125 MG IJ SOLR
125.0000 mg | Freq: Once | INTRAMUSCULAR | Status: AC
  Administered 2017-03-15: 125 mg via INTRAVENOUS
  Filled 2017-03-15: qty 2

## 2017-03-15 MED ORDER — KETOROLAC TROMETHAMINE 30 MG/ML IJ SOLN
30.0000 mg | Freq: Once | INTRAMUSCULAR | Status: AC
  Administered 2017-03-15: 30 mg via INTRAVENOUS
  Filled 2017-03-15: qty 1

## 2017-03-15 MED ORDER — ONDANSETRON HCL 4 MG/2ML IJ SOLN
4.0000 mg | Freq: Once | INTRAMUSCULAR | Status: AC
  Administered 2017-03-15: 4 mg via INTRAVENOUS
  Filled 2017-03-15: qty 2

## 2017-03-15 MED ORDER — LORAZEPAM 2 MG/ML IJ SOLN
1.0000 mg | Freq: Once | INTRAMUSCULAR | Status: AC
  Administered 2017-03-15: 1 mg via INTRAVENOUS
  Filled 2017-03-15: qty 1

## 2017-03-15 MED ORDER — DEXTROSE 5 % IV SOLN
1.0000 g | Freq: Three times a day (TID) | INTRAVENOUS | Status: DC
  Administered 2017-03-16 – 2017-03-19 (×10): 1 g via INTRAVENOUS
  Filled 2017-03-15 (×11): qty 1

## 2017-03-15 MED ORDER — MORPHINE SULFATE (PF) 4 MG/ML IV SOLN
4.0000 mg | Freq: Once | INTRAVENOUS | Status: AC
  Administered 2017-03-15: 4 mg via INTRAVENOUS
  Filled 2017-03-15: qty 1

## 2017-03-15 MED ORDER — VANCOMYCIN HCL 10 G IV SOLR
1500.0000 mg | Freq: Once | INTRAVENOUS | Status: DC
  Filled 2017-03-15: qty 1500

## 2017-03-15 MED ORDER — ALBUTEROL SULFATE (2.5 MG/3ML) 0.083% IN NEBU
5.0000 mg | INHALATION_SOLUTION | Freq: Once | RESPIRATORY_TRACT | Status: AC
  Administered 2017-03-15: 5 mg via RESPIRATORY_TRACT
  Filled 2017-03-15: qty 6

## 2017-03-15 MED ORDER — CLONAZEPAM 0.5 MG PO TABS
0.5000 mg | ORAL_TABLET | Freq: Two times a day (BID) | ORAL | Status: DC | PRN
  Administered 2017-03-16 – 2017-03-19 (×5): 0.5 mg via ORAL
  Filled 2017-03-15 (×5): qty 1

## 2017-03-15 MED ORDER — VANCOMYCIN HCL IN DEXTROSE 1-5 GM/200ML-% IV SOLN
1000.0000 mg | Freq: Two times a day (BID) | INTRAVENOUS | Status: DC
  Administered 2017-03-16: 1000 mg via INTRAVENOUS
  Filled 2017-03-15: qty 200

## 2017-03-15 MED ORDER — GABAPENTIN 300 MG PO CAPS
600.0000 mg | ORAL_CAPSULE | Freq: Three times a day (TID) | ORAL | Status: DC
  Administered 2017-03-15 – 2017-03-20 (×15): 600 mg via ORAL
  Filled 2017-03-15 (×15): qty 2

## 2017-03-15 MED ORDER — LAMOTRIGINE 100 MG PO TABS
200.0000 mg | ORAL_TABLET | Freq: Every day | ORAL | Status: DC
  Administered 2017-03-15 – 2017-03-19 (×5): 200 mg via ORAL
  Filled 2017-03-15 (×7): qty 2

## 2017-03-15 MED ORDER — ACETAMINOPHEN 650 MG RE SUPP: 650.0000 mg | Freq: Four times a day (QID) | RECTAL | Status: DC | PRN

## 2017-03-15 MED ORDER — LEVOTHYROXINE SODIUM 25 MCG PO TABS
137.0000 ug | ORAL_TABLET | Freq: Every day | ORAL | Status: DC
  Administered 2017-03-16 – 2017-03-20 (×5): 137 ug via ORAL
  Filled 2017-03-15 (×5): qty 1

## 2017-03-15 NOTE — ED Notes (Signed)
Phlebotomy at bedside. Pt completed breathing tx. Removed mask.

## 2017-03-15 NOTE — H&P (Addendum)
TRH H&P    Patient Demographics:    Marisa Johnson, is a 41 y.o. female  MRN: [id number]  DOB - 08-Mar-1976  Admit Date - 03/15/2017  Referring MD/NP/PA: Dr Particia Nearing  Outpatient Primary MD for the patient is Benita Stabile, MD  Patient coming from: Home  Chief Complaint  Patient presents with  . Shortness of Breath      HPI:    Marisa Johnson  is a 41 y.o. 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 Patient was recently diagnosed with Sjogren syndrome, and has been followed by rheumatologist. She denies fever, no nausea vomiting or diarrhea. No dysuria. She complains of dry cough with no phlegm. Complains of chest pain from coughing  In the ED, patient was hypoxic on room air with O2 sats in the 80s. Required 2 L of oxygen via nasal cannula and maintaining saturation in 90s. Chest x-ray showed no acute disease. CTA chest showed new bilateral diffuse interstitial infiltrates could be due to pulmonary edema or interstitial pneumonitis.  Patient was given Solu-Medrol 125 mg IV 1, vancomycin and cefepime per pharmacy consultation for presumed HCAP.    Review of systems:    In addition to the HPI above,  No Fever,+ Fatigue No Headache, No changes with Vision or hearing, No problems swallowing food or Liquids, No Abdominal pain, No Nausea or Vomiting, bowel movements are regular, No Blood in stool or Urine, No dysuria, No new skin rashes or bruises, No new weakness, tingling, numbness in any extremity, No recent weight gain or loss, No polyuria, polydypsia or polyphagia, No significant Mental Stressors.  A full 10 point Review of Systems was done, except as stated above, all other Review of Systems were negative.   With Past History of the following :    Past Medical History:  Diagnosis Date  . Anxiety   . Bipolar  disorder (HCC)   . Carpal tunnel syndrome   . Contraceptive management 08/09/2015  . Decreased libido 08/09/2015  . Friable cervix 08/09/2015  . History of abnormal cervical Pap smear 08/09/2015  . IBS (irritable bowel syndrome)   . Irregular intermenstrual bleeding 08/09/2015  . Major depressive disorder   . Migraine   . Pseudobulbar affect   . PTSD (post-traumatic stress disorder)   . Thyroid disease   . Vaginal Pap smear, abnormal       Past Surgical History:  Procedure Laterality Date  . ESOPHAGOGASTRODUODENOSCOPY N/A 11/17/2016   Procedure: ESOPHAGOGASTRODUODENOSCOPY (EGD);  Surgeon: Malissa Hippo, MD;  [facility];  Service: Endoscopy;  Laterality: N/A;  . None        Social History:      Social History  Substance Use Topics  . Smoking status: Never Smoker  . Smokeless tobacco: Never Used     Comment: Never smoker  . Alcohol use No       Family History :     Family History  Problem Relation Age of Onset  .  Diabetes Mother   . Hypertension Mother   . Hypertension Father   . Stroke Father   . Cancer Maternal Grandmother   . Kidney disease Paternal Grandmother   . Hypertension Paternal Grandmother   . Hyperlipidemia Paternal Grandmother   . Heart disease Paternal Grandmother   . Heart attack Paternal Grandmother   . Diabetes Daughter   . Hypothyroidism Daughter   . Anxiety disorder Sister   . Anxiety disorder Brother   . Anxiety disorder Sister   . Colon cancer Neg Hx       Home Medications:   Prior to Admission medications   Medication Sig Start Date End Date Taking? Authorizing Provider  buPROPion (WELLBUTRIN XL) 300 MG 24 hr tablet Take 300 mg by mouth at bedtime.   Yes [provider]  clonazePAM (KLONOPIN) 0.5 MG tablet Take 0.5 mg by mouth at bedtime as needed for anxiety (and/or sleep).    Yes [provider]  cyclobenzaprine (FLEXERIL) 10 MG tablet Take 10 mg by mouth 3 (three) times daily as needed for muscle  spasms.   Yes [provider]  gabapentin (NEURONTIN) 600 MG tablet Take 600 mg by mouth 3 (three) times daily as needed (for pain).    Yes [provider]  hydroxychloroquine (PLAQUENIL) 200 MG tablet Take 400 mg by mouth at bedtime.   Yes [provider]  lamoTRIgine (LAMICTAL) 200 MG tablet Take 300 mg by mouth at bedtime.    Yes [provider]  levothyroxine (SYNTHROID, LEVOTHROID) 137 MCG tablet Take 137 mcg by mouth at bedtime.    Yes [provider]  meclizine (ANTIVERT) 25 MG tablet Take 25 mg by mouth 3 (three) times daily as needed for dizziness.   Yes [provider]  traMADol (ULTRAM) 50 MG tablet Take 50-100 mg by mouth every 6 (six) hours as needed for moderate pain.    Yes [provider]  traZODone (DESYREL) 100 MG tablet Take 100 mg by mouth at bedtime.   Yes [provider]     Allergies:     Allergies  Allergen Reactions  . Abilify [Aripiprazole] Other (See Comments)    Reaction:  Restless leg syndrome   . Reglan [Metoclopramide] Other (See Comments)    Reaction:  Hallucinations   . Dexamethasone Rash     Physical Exam:   Vitals  Blood pressure 92/61, pulse (!) 113, temperature 97.7 F (36.5 C), temperature source Oral, resp. rate (!) 26, height 5\' 3"  (1.6 m), weight 83.5 kg (184 lb), last menstrual period 03/15/2017, SpO2 96 %.  1.  General: Appears in mild respiratory distress  2. Psychiatric:  Intact judgement and  insight, awake alert, oriented x 3.  3. Neurologic: No focal neurological deficits, all cranial nerves intact.Strength 5/5 all 4 extremities, sensation intact all 4 extremities, plantars down going.  4. Eyes :  anicteric sclerae, moist conjunctivae with no lid lag. PERRLA.  5. ENMT:  Oropharynx clear with dry mucous membranes  6. Neck:  supple, no cervical lymphadenopathy appriciated, No thyromegaly  7. Respiratory : Tachypnea, bibasilar crackles on  auscultation  8. Cardiovascular : RRR, no gallops, rubs or murmurs, no leg edema  9. Gastrointestinal:  Positive bowel sounds, abdomen soft, non-tender to palpation,no hepatosplenomegaly, no rigidity or guarding       10. Skin:  No cyanosis, normal texture and turgor, no rash, lesions or ulcers  11.Musculoskeletal:  Good muscle tone,  joints appear normal , no effusions,  normal range of motion  Data Review:    CBC  Recent Labs Lab 03/15/17 1357  WBC 13.3*  HGB 12.9  HCT 39.3  PLT 218  MCV 89.3  MCH 29.3  MCHC 32.8  RDW 13.1  LYMPHSABS 1.7  MONOABS 0.4  EOSABS 0.4  BASOSABS 0.0   ------------------------------------------------------------------------------------------------------------------  Chemistries   Recent Labs Lab 03/15/17 1357  NA 135  K 3.3*  CL 102  CO2 25  GLUCOSE 99  BUN 8  CREATININE 1.13*  CALCIUM 9.1  AST 31  ALT 33  ALKPHOS 71  BILITOT 0.9   ------------------------------------------------------------------------------------------------------------------  ------------------------------------------------------------------------------------------------------------------ GFR: Estimated Creatinine Clearance: 67.7 mL/min (A) (by C-G formula based on SCr of 1.13 mg/dL (H)). Liver Function Tests:  Recent Labs Lab 03/15/17 1357  AST 31  ALT 33  ALKPHOS 71  BILITOT 0.9  PROT 7.3  ALBUMIN 4.3   No results for input(s): LIPASE, AMYLASE in the last 168 hours. No results for input(s): AMMONIA in the last 168 hours. Coagulation Profile: No results for input(s): INR, PROTIME in the last 168 hours. Cardiac Enzymes:  Recent Labs Lab 03/15/17 1357 03/15/17 1725  TROPONINI <0.03 <0.03    --------------------------------------------------------------------------------------------------------------- Urine analysis:    Component Value Date/Time   COLORURINE YELLOW 03/15/2017 1346   APPEARANCEUR CLEAR 03/15/2017 1346   LABSPEC  1.005 03/15/2017 1346   PHURINE 5.0 03/15/2017 1346   GLUCOSEU NEGATIVE 03/15/2017 1346   HGBUR LARGE (A) 03/15/2017 1346   BILIRUBINUR NEGATIVE 03/15/2017 1346   KETONESUR NEGATIVE 03/15/2017 1346   PROTEINUR NEGATIVE 03/15/2017 1346   UROBILINOGEN 0.2 07/12/2015 0931   NITRITE NEGATIVE 03/15/2017 1346   LEUKOCYTESUR NEGATIVE 03/15/2017 1346      Imaging Results:    Dg Chest 2 View  Result Date: 03/15/2017 CLINICAL DATA:  Shortness of breath and cough EXAM: CHEST  2 VIEW COMPARISON:  12/24/2013 FINDINGS: The lungs are clear wiithout focal pneumonia, edema, pneumothorax or pleural effusion. The cardiopericardial silhouette is within normal limits for size. The visualized bony structures of the thorax are intact. Telemetry leads overlie the chest. IMPRESSION: No active cardiopulmonary disease. Electronically Signed   By: Kennith Center M.D.   On: 03/15/2017 14:42   Ct Angio Chest Pe W Or Wo Contrast  Result Date: 03/15/2017 CLINICAL DATA:  Progressive shortness of breath and chest pain for 4 days. EXAM: CT ANGIOGRAPHY CHEST WITH CONTRAST TECHNIQUE: Multidetector CT imaging of the chest was performed using the standard protocol during bolus administration of intravenous contrast. Multiplanar CT image reconstructions and MIPs were obtained to evaluate the vascular anatomy. CONTRAST:  100 mL Isovue 370 COMPARISON:  09/24/2016 FINDINGS: Cardiovascular: Satisfactory opacification of pulmonary arteries noted, and no pulmonary emboli identified. No evidence of thoracic aortic dissection or aneurysm. Mediastinum/Nodes: No masses or pathologically enlarged lymph nodes identified. Shotty less than 1 cm mediastinal and hilar lymph nodes are seen, nonspecific and likely reactive in etiology. Lungs/Pleura: New diffuse interstitial infiltrates are seen, which may be due to edema or interstitial pneumonitis. Mild infiltrate or atelectasis seen in the posterior right lower lobe. Mild dependent atelectasis seen in  the left lung base. No evidence of pleural effusion. Upper abdomen: No acute findings. Musculoskeletal: No suspicious bone lesions identified. Review of the MIP images confirms the above findings. IMPRESSION: No evidence of pulmonary embolism. New bilateral diffuse interstitial infiltrates, which may be due to pulmonary edema or interstitial pneumonitis. Mild infiltrate or atelectasis in posterior right lower lobe. No evidence of pleural effusion. Electronically Signed   By: Alver Sorrow.D.  On: 03/15/2017 18:20    My personal review of EKG: Rhythm NSR, QTc 627   Assessment & Plan:    Active Problems:   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HCC)   Sjogren's syndrome with lung involvement (HCC)    QTc prolongation  1. Interstitial pneumonia in the setting of new diagnosis of Sjogren's syndrome- patient's CT chest shows bilateral interstitial infiltrates, I called and discussed with pulmonologist Dr. Levada Schilling at [facility], who agrees with Solu-Medrol 60 minutes grams IV every 6 hours. Continue with oxygen via nasal cannula. Patient will need formal pulmonary consultation at some point either during this admission or as outpatient. 2. Presumed healthcare associated pneumonia- patient works as Charity fundraiser at [employer], started on vancomycin and cefepime empirically for healthcare necessary pneumonia. We will continue with antibiotics for now, follow blood culture results. 3. QTc prolongation- EKG shows significant elevated QTc 627, potassium is 3.3, will replace potassium 40 meq by mouth 1. Check serum magnesium level. Monitor on telemetry. We'll repeat EKG in a.m.Will hold trazodone, tramadol, avoid Zofran. 4. Chest pain-likely musculo skeletal, troponin 2 is negative in the ED. Start morphine 1 mg IV every 4 hours when necessary for pain. 5. Sjogren's syndrome-patient recently diagnosed with Sjogren's syndrome, follows up with rheumatologist as outpatient. 6. Hypothyroidism-continue Synthroid 137 g  daily 7. Insomnia-continue clonazepam 0.5 g by mouth twice a day when necessary, will add Ambien 5 mg daily as needed at bedtime as per patient request.  DVT Prophylaxis-   Lovenox   AM Labs Ordered, also please review Full Orders  Family Communication: Admission, patients condition and plan of care including tests being ordered have been discussed with the patient and  who indicate understanding and agree with the plan and Code Status.  Code Status:  Full code  Admission status: Inpatient    Time spent in minutes : 60 minutes   Analysia Dungee S M.D on 03/15/2017 at 7:58 PM  Between 7am to 7pm - Pager - [phone]. After 7pm go to [email or web address] - password [facility]  [facility] - Office  [phone]

## 2017-03-15 NOTE — ED Notes (Signed)
EDP at bedside  

## 2017-03-15 NOTE — ED Provider Notes (Signed)
[facility]PT Provider Note   CSN: [id number] Arrival date & time: 03/15/17  1330     History   Chief Complaint Chief Complaint  Patient presents with  . Shortness of Breath    HPI Marisa Johnson is a 41 y.o. female.  Pt presents to the ED today with sob and cough.  She works in the ED here as a Engineer, civil (consulting)nurse and noticed a cough after her shift on Thursday, the 31st.  The pt said sx have gotten worse.  She said that she has also not been able to sleep.  She called her doctor and was told to take an extra trazodone.  That has not helped.  She said she thought she was getting an UTI and has recently taken macrobid and pyridium.  The pt denies any high fevers, but has had a low grade temp.  Pt has not had a productive cough, it's been burning and dry.      Past Medical History:  Diagnosis Date  . Anxiety   . Bipolar disorder (HCC)   . Carpal tunnel syndrome   . Contraceptive management 08/09/2015  . Decreased libido 08/09/2015  . Friable cervix 08/09/2015  . History of abnormal cervical Pap smear 08/09/2015  . IBS (irritable bowel syndrome)   . Irregular intermenstrual bleeding 08/09/2015  . Major depressive disorder   . Migraine   . Pseudobulbar affect   . PTSD (post-traumatic stress disorder)   . Thyroid disease   . Vaginal Pap smear, abnormal     Patient Active Problem List   Diagnosis Date Noted  . Irregular intermenstrual bleeding 08/09/2015  . Decreased libido 08/09/2015  . Friable cervix 08/09/2015  . Contraceptive management 08/09/2015  . History of abnormal cervical Pap smear 08/09/2015  . Abdominal pain, left lower quadrant 09/21/2013  . Nausea and vomiting 09/20/2013  . IRRITABLE BOWEL SYNDROME 05/29/2010  . CLOSED FRACTURE OF HEAD OF RADIUS 04/13/2008  . FX CLOSED FIBULA NOS 04/13/2008    Past Surgical History:  Procedure Laterality Date  . ESOPHAGOGASTRODUODENOSCOPY N/A 11/17/2016   Procedure: ESOPHAGOGASTRODUODENOSCOPY (EGD);  Surgeon: Malissa HippoNajeeb U  Rehman, MD;  Location: [facility];  Service: Endoscopy;  Laterality: N/A;  . None      OB History    Gravida Para Term Preterm AB Living   3 3 3     3    SAB TAB Ectopic Multiple Live Births                   Home Medications    Prior to Admission medications   Medication Sig Start Date End Date Taking? Authorizing Provider  buPROPion (WELLBUTRIN XL) 300 MG 24 hr tablet Take 300 mg by mouth at bedtime.    [provider]  clonazePAM (KLONOPIN) 0.5 MG tablet Take 0.5 mg by mouth at bedtime as needed for anxiety.     [provider]  cyclobenzaprine (FLEXERIL) 10 MG tablet Take 10 mg by mouth 3 (three) times daily as needed for muscle spasms.    [provider]  gabapentin (NEURONTIN) 600 MG tablet Take 600 mg by mouth 3 (three) times daily.    [provider]  lamoTRIgine (LAMICTAL) 200 MG tablet Take 200 mg by mouth at bedtime.    [provider]  levothyroxine (SYNTHROID, LEVOTHROID) 112 MCG tablet Take 125 mcg by mouth daily before breakfast.     [provider]  Linaclotide (LINZESS) 290 MCG CAPS capsule Take 1 capsule (290 mcg total) by mouth daily.  Take 30 minutes before breakfast. Patient taking differently: Take 290 mcg by mouth daily. Prn 09/16/13   Gelene Mink, NP  LO LOESTRIN FE 1 MG-10 MCG / 10 MCG tablet TAKE 1 TABLET BY MOUTH DAILY. 08/22/16   [name], NP  meclizine (ANTIVERT) 25 MG tablet Take 25 mg by mouth 3 (three) times daily as needed for dizziness.    [provider]  nystatin-triamcinolone ointment (MYCOLOG) Apply 1 application topically 2 (two) times daily. To affected area. Patient taking differently: Apply 1 application topically as needed. To affected area. 04/21/15   Tilda Burrow, MD  traMADol (ULTRAM) 50 MG tablet Take 100 mg by mouth every 6 (six) hours as needed.    [provider]  traZODone (DESYREL) 100 MG tablet Take 100 mg by mouth at bedtime.    [provider]    Family History Family History  Problem Relation Age of Onset  . Diabetes Mother   . Hypertension Mother   . Hypertension Father   . Stroke Father   . Cancer Maternal Grandmother   . Kidney disease Paternal Grandmother   . Hypertension Paternal Grandmother   . Hyperlipidemia Paternal Grandmother   . Heart disease Paternal Grandmother   . Heart attack Paternal Grandmother   . Diabetes Daughter   . Hypothyroidism Daughter   . Anxiety disorder Sister   . Anxiety disorder Brother   . Anxiety disorder Sister   . Colon cancer Neg Hx     Social History Social History  Substance Use Topics  . Smoking status: Never Smoker  . Smokeless tobacco: Never Used     Comment: Never smoker  . Alcohol use No     Allergies   Abilify [aripiprazole]; Reglan [metoclopramide]; and Dexamethasone   Review of Systems Review of Systems  Constitutional: Positive for fatigue and fever.  Respiratory: Positive for cough and shortness of breath.   Psychiatric/Behavioral: Positive for sleep disturbance.  All other systems reviewed and are negative.    Physical Exam Updated Vital Signs BP (!) 117/57   Pulse (!) 129   Temp 99.5 F (37.5 C) (Oral)   Resp (!) 37   Ht 5\' 3"  (1.6 m)   Wt 83.5 kg (184 lb)   LMP 03/15/2017   SpO2 96%   BMI 32.59 kg/m   Physical Exam  Constitutional: She is oriented to person, place, and time. She appears well-developed. She appears distressed.  HENT:  Head: Normocephalic and atraumatic.  Right Ear: External ear normal.  Left Ear: External ear normal.  Nose: Nose normal.  Mouth/Throat: Mucous membranes are dry.  Eyes: Conjunctivae and EOM are normal. Pupils are equal, round, and reactive to light.  Neck: Normal range of motion. Neck supple.  Cardiovascular: Regular rhythm, normal heart sounds and intact distal pulses.  Tachycardia present.   Pulmonary/Chest: Accessory muscle usage present. Tachypnea noted.  Abdominal: Soft. Bowel sounds  are normal.  Musculoskeletal: Normal range of motion.  Neurological: She is alert and oriented to person, place, and time.  Skin: Skin is warm.  Psychiatric: She has a normal mood and affect. Her behavior is normal. Judgment and thought content normal.  Nursing note and vitals reviewed.    ED Treatments / Results  Labs (all labs ordered are listed, but only abnormal results are displayed) Labs Reviewed  COMPREHENSIVE METABOLIC PANEL - Abnormal; Notable for the following:       Result Value   Potassium 3.3 (*)    Creatinine, Ser 1.13 (*)  GFR calc non Af Amer 60 (*)    All other components within normal limits  CBC WITH DIFFERENTIAL/PLATELET - Abnormal; Notable for the following:    WBC 13.3 (*)    Neutro Abs 10.8 (*)    All other components within normal limits  URINALYSIS, ROUTINE W REFLEX MICROSCOPIC - Abnormal; Notable for the following:    Hgb urine dipstick LARGE (*)    All other components within normal limits  TROPONIN I  D-DIMER, QUANTITATIVE (NOT AT [facility])  PREGNANCY, URINE  TROPONIN I  LACTIC ACID, PLASMA  LACTIC ACID, PLASMA  BRAIN NATRIURETIC PEPTIDE    EKG  EKG Interpretation  Date/Time:  Saturday March 15 2017 16:56:09 EDT Ventricular Rate:  128 PR Interval:    QRS Duration: 104 QT Interval:  429 QTC Calculation: 627 R Axis:   -32 Text Interpretation:  Sinus tachycardia Left axis deviation Prolonged QT interval Confirmed by Jacalyn Lefevre [phone]) on 03/15/2017 5:00:31 PM       Radiology Dg Chest 2 View  Result Date: 03/15/2017 CLINICAL DATA:  Shortness of breath and cough EXAM: CHEST  2 VIEW COMPARISON:  12/24/2013 FINDINGS: The lungs are clear wiithout focal pneumonia, edema, pneumothorax or pleural effusion. The cardiopericardial silhouette is within normal limits for size. The visualized bony structures of the thorax are intact. Telemetry leads overlie the chest. IMPRESSION: No active cardiopulmonary disease. Electronically Signed   By: Kennith Center M.D.   On: 03/15/2017 14:42   Ct Angio Chest Pe W Or Wo Contrast  Result Date: 03/15/2017 CLINICAL DATA:  Progressive shortness of breath and chest pain for 4 days. EXAM: CT ANGIOGRAPHY CHEST WITH CONTRAST TECHNIQUE: Multidetector CT imaging of the chest was performed using the standard protocol during bolus administration of intravenous contrast. Multiplanar CT image reconstructions and MIPs were obtained to evaluate the vascular anatomy. CONTRAST:  100 mL Isovue 370 COMPARISON:  09/24/2016 FINDINGS: Cardiovascular: Satisfactory opacification of pulmonary arteries noted, and no pulmonary emboli identified. No evidence of thoracic aortic dissection or aneurysm. Mediastinum/Nodes: No masses or pathologically enlarged lymph nodes identified. Shotty less than 1 cm mediastinal and hilar lymph nodes are seen, nonspecific and likely reactive in etiology. Lungs/Pleura: New diffuse interstitial infiltrates are seen, which may be due to edema or interstitial pneumonitis. Mild infiltrate or atelectasis seen in the posterior right lower lobe. Mild dependent atelectasis seen in the left lung base. No evidence of pleural effusion. Upper abdomen: No acute findings. Musculoskeletal: No suspicious bone lesions identified. Review of the MIP images confirms the above findings. IMPRESSION: No evidence of pulmonary embolism. New bilateral diffuse interstitial infiltrates, which may be due to pulmonary edema or interstitial pneumonitis. Mild infiltrate or atelectasis in posterior right lower lobe. No evidence of pleural effusion. Electronically Signed   By: Myles Rosenthal M.D.   On: 03/15/2017 18:20    Procedures Procedures (including critical care time)  Medications Ordered in ED Medications  iopamidol (ISOVUE-370) 76 % injection 100 mL (not administered)  ceFEPIme (MAXIPIME) 1 g in dextrose 5 % 50 mL IVPB (1 g Intravenous New Bag/Given 03/15/17 1845)  albuterol (PROVENTIL) (2.5 MG/3ML) 0.083% nebulizer solution 5 mg (5  mg Nebulization Given 03/15/17 1443)  ketorolac (TORADOL) 30 MG/ML injection 30 mg (30 mg Intravenous Given 03/15/17 1413)  sodium chloride 0.9 % bolus 1,000 mL (0 mLs Intravenous Stopped 03/15/17 1534)  methylPREDNISolone sodium succinate (SOLU-MEDROL) 125 mg/2 mL injection 125 mg (125 mg Intravenous Given 03/15/17 1539)  sodium chloride 0.9 % bolus 1,000 mL (0 mLs Intravenous  Stopped 03/15/17 1626)  albuterol (PROVENTIL,VENTOLIN) solution continuous neb (10 mg/hr Nebulization Given 03/15/17 1614)  morphine 4 MG/ML injection 4 mg (4 mg Intravenous Given 03/15/17 1622)  ondansetron (ZOFRAN) injection 4 mg (4 mg Intravenous Given 03/15/17 1622)  LORazepam (ATIVAN) injection 1 mg (1 mg Intravenous Given 03/15/17 1707)  sodium chloride 0.9 % bolus 1,000 mL (1,000 mLs Intravenous New Bag/Given 03/15/17 1845)     Initial Impression / Assessment and Plan / ED Course  I have reviewed the triage vital signs and the nursing notes.  Pertinent labs & imaging results that were available during my care of the patient were reviewed by me and considered in my medical decision making (see chart for details).    Pt has pna seen on CT chest.  She is exposed to many pathogens working in the ED, so I treated her for HCAP.  Pt's O2 sats drop to the upper 80s when lying in bed, so I placed her on oxygen.  Pt d/w Dr. Sharl Ma (triad) for admission.  Final Clinical Impressions(s) / ED Diagnoses   Final diagnoses:  Chest pain  Multifocal pneumonia  Hypoxia  Moderate persistent reactive airway disease with acute exacerbation    New Prescriptions New Prescriptions   No medications on file     Jacalyn Lefevre, MD 03/15/17 1908

## 2017-03-15 NOTE — ED Notes (Signed)
Respiratory paged for breathing treatment 

## 2017-03-15 NOTE — ED Notes (Signed)
RT notified of second breathing tx.

## 2017-03-15 NOTE — ED Notes (Signed)
CT notified pt is ready for scan 

## 2017-03-15 NOTE — ED Notes (Signed)
EKG given to EDP.  

## 2017-03-15 NOTE — ED Notes (Signed)
Pt c/o LT sided CP radiating between shoulder blades. EKG obtained. Will give to EDP.

## 2017-03-15 NOTE — ED Triage Notes (Signed)
Shortness of breath with couth for several days  Dr Hughie ClossZ Hall

## 2017-03-15 NOTE — ED Notes (Signed)
Pt returned from xray

## 2017-03-15 NOTE — ED Notes (Addendum)
Transport arrived to take pt to CT. Informed to come by later when pt completes neb tx.

## 2017-03-15 NOTE — ED Notes (Signed)
Pt called out reporting CP. EDP notified.

## 2017-03-15 NOTE — Progress Notes (Signed)
RN tried calling report to ICU/stepdown unit. No answer on unit. Will try again

## 2017-03-15 NOTE — ED Notes (Signed)
Pt transported to CT ?

## 2017-03-15 NOTE — Progress Notes (Signed)
Pharmacy Antibiotic Note  Marisa Johnson is a 41 y.o. female admitted on 03/15/2017 with pneumonia.  Pharmacy has been consulted for Vancomycin and Cefepime dosing.  Plan: Vancomycin 1500mg  loading dose given in ED, then 1gm  IV every 12 hours.  Goal trough 15-20 mcg/mL.  Cefepime 1gm IV q8h F/U cxs and clinical progress Monitor V/S, labs, and levels as indicated  Height: 5\' 3"  (160 cm) Weight: 184 lb (83.5 kg) IBW/kg (Calculated) : 52.4  Temp (24hrs), Avg:98.3 F (36.8 C), Min:97.7 F (36.5 C), Max:99.5 F (37.5 C)   Recent Labs Lab 03/15/17 1357 03/15/17 2030  WBC 13.3*  --   CREATININE 1.13*  --   LATICACIDVEN 1.6 8.7*    Estimated Creatinine Clearance: 67.7 mL/min (A) (by C-G formula based on SCr of 1.13 mg/dL (H)).    Allergies  Allergen Reactions  . Abilify [Aripiprazole] Other (See Comments)    Reaction:  Restless leg syndrome   . Reglan [Metoclopramide] Other (See Comments)    Reaction:  Hallucinations   . Dexamethasone Rash    Antimicrobials this admission: Vancomycin 6/2 >>  Cefepime 6/2 >>   Dose adjustments this admission: N/A  Microbiology results: 6/2 BCx: pending  Thank you for [facility] pharmacy to be a part of this patient's care.  Elder CyphersLorie Roseanne Juenger, BS Loura Backharm D, [place] Clinical Pharmacist Pager [phone]  03/15/2017 10:09 PM

## 2017-03-15 NOTE — ED Notes (Signed)
Pt returned from CT °

## 2017-03-15 NOTE — Progress Notes (Signed)
[facility] Pt going to room 6

## 2017-03-16 ENCOUNTER — Other Ambulatory Visit: Payer: Self-pay

## 2017-03-16 ENCOUNTER — Inpatient Hospital Stay (HOSPITAL_COMMUNITY): Payer: 59

## 2017-03-16 DIAGNOSIS — J9600 Acute respiratory failure, unspecified whether with hypoxia or hypercapnia: Secondary | ICD-10-CM

## 2017-03-16 LAB — COMPREHENSIVE METABOLIC PANEL
ALBUMIN: 3.6 g/dL (ref 3.5–5.0)
ALK PHOS: 62 U/L (ref 38–126)
ALT: 26 U/L (ref 14–54)
AST: 22 U/L (ref 15–41)
Anion gap: 6 (ref 5–15)
BILIRUBIN TOTAL: 0.3 mg/dL (ref 0.3–1.2)
BUN: 6 mg/dL (ref 6–20)
CO2: 22 mmol/L (ref 22–32)
CREATININE: 0.8 mg/dL (ref 0.44–1.00)
Calcium: 8.4 mg/dL — ABNORMAL LOW (ref 8.9–10.3)
Chloride: 112 mmol/L — ABNORMAL HIGH (ref 101–111)
GFR calc Af Amer: >60 mL/min (ref >60)
GLUCOSE: 172 mg/dL — AB (ref 65–99)
POTASSIUM: 3.4 mmol/L — AB (ref 3.5–5.1)
Sodium: 140 mmol/L (ref 135–145)
TOTAL PROTEIN: 6.4 g/dL — AB (ref 6.5–8.1)

## 2017-03-16 LAB — CBC
HEMATOCRIT: 34.5 % — AB (ref 36.0–46.0)
HEMOGLOBIN: 11.4 g/dL — AB (ref 12.0–15.0)
MCH: 29.4 pg (ref 26.0–34.0)
MCHC: 33 g/dL (ref 30.0–36.0)
MCV: 88.9 fL (ref 78.0–100.0)
Platelets: 216 10*3/uL (ref 150–400)
RBC: 3.88 MIL/uL (ref 3.87–5.11)
RDW: 13.3 % (ref 11.5–15.5)
WBC: 13.3 10*3/uL — AB (ref 4.0–10.5)

## 2017-03-16 LAB — ECHOCARDIOGRAM COMPLETE
Height: 63 in
WEIGHTICAEL: 2944 [oz_av]

## 2017-03-16 LAB — LACTIC ACID, PLASMA
Lactic Acid, Venous: 5 mmol/L (ref 0.5–1.9)
Lactic Acid, Venous: 1 mmol/L (ref 0.5–1.9)

## 2017-03-16 LAB — MRSA PCR SCREENING: MRSA BY PCR: NEGATIVE

## 2017-03-16 MED ORDER — CEFEPIME HCL 1 G IJ SOLR
INTRAMUSCULAR | Status: AC
  Filled 2017-03-16: qty 1

## 2017-03-16 MED ORDER — IPRATROPIUM-ALBUTEROL 0.5-2.5 (3) MG/3ML IN SOLN
3.0000 mL | Freq: Four times a day (QID) | RESPIRATORY_TRACT | Status: DC | PRN
  Administered 2017-03-16 – 2017-03-17 (×4): 3 mL via RESPIRATORY_TRACT
  Filled 2017-03-16 (×4): qty 3

## 2017-03-16 MED ORDER — LEVOFLOXACIN IN D5W 750 MG/150ML IV SOLN
750.0000 mg | INTRAVENOUS | Status: DC
  Administered 2017-03-16 – 2017-03-18 (×3): 750 mg via INTRAVENOUS
  Filled 2017-03-16 (×4): qty 150

## 2017-03-16 MED ORDER — HYDROMORPHONE HCL 1 MG/ML IJ SOLN
1.0000 mg | INTRAMUSCULAR | Status: DC | PRN
  Administered 2017-03-16 – 2017-03-20 (×9): 1 mg via INTRAVENOUS
  Filled 2017-03-16 (×9): qty 1

## 2017-03-16 MED ORDER — PERFLUTREN LIPID MICROSPHERE
3.0000 mL | INTRAVENOUS | Status: AC | PRN
  Filled 2017-03-16: qty 10

## 2017-03-16 MED ORDER — PROMETHAZINE HCL 25 MG/ML IJ SOLN
12.5000 mg | Freq: Four times a day (QID) | INTRAMUSCULAR | Status: DC | PRN
  Administered 2017-03-16: 12.5 mg via INTRAVENOUS
  Filled 2017-03-16: qty 1

## 2017-03-16 NOTE — Consult Note (Signed)
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 She also had problems with her thyroid. And has been on thyroid replacement. She says that she has had confusion shortness of breath cough and congestion for the last several days. She's had some headache. She's had a little bit of abdominal pain. She had previously had significant swelling of her legs which has improved. She did not have any documented fever but felt like she was cold and had chills. She's not coughed up any sputum. No other new complaints. She has had some chest pain that she associates with her cough.  Past Medical History:  Diagnosis Date  . Anxiety   . Bipolar disorder (HCC)   . Carpal tunnel syndrome   . Contraceptive management 08/09/2015  . Decreased libido 08/09/2015  . Friable cervix 08/09/2015  . History of abnormal cervical Pap smear 08/09/2015  . IBS (irritable bowel syndrome)   . Irregular intermenstrual bleeding 08/09/2015  . Major depressive disorder   . Migraine   . Pseudobulbar affect   . PTSD (post-traumatic stress disorder)   . Thyroid disease   . Vaginal Pap smear, abnormal      Family History  Problem Relation Age of Onset  . Diabetes Mother   . Hypertension Mother   . Hypertension Father   . Stroke Father   . Cancer Maternal Grandmother   . Kidney disease Paternal Grandmother   . Hypertension Paternal Grandmother   . Hyperlipidemia Paternal Grandmother   . Heart disease Paternal Grandmother   . Heart attack Paternal Grandmother   . Diabetes Daughter   . Hypothyroidism Daughter   . Anxiety disorder Sister   . Anxiety disorder Brother   . Anxiety disorder Sister   . Colon cancer Neg Hx      Social History   Social History  . Marital status: Divorced    Spouse name: N/A  .  Number of children: N/A  . Years of education: N/A   Occupational History  . 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 Smoking status: Never Smoker  . Smokeless tobacco: Never Used     Comment: Never smoker  . Alcohol use No  . Drug use: No  . Sexual activity: Yes    Birth control/ protection: OCP, Pill   Other Topics Concern  . None   Social History Narrative  . None     ROS: Except as mentioned 10 point review of systems is negative    Objective: Vital signs in last 24 hours: Temp:  [97.7 F (36.5 C)-99.5 F (37.5 C)] 98.1 F (36.7 C) (06/03 0400) Pulse Rate:  [79-129] 79 (06/03 0600) Resp:  [15-43] 23 (06/03 0600) BP: (92-134)/(49-79) 132/78 (06/03 0600) SpO2:  [91 %-99 %] 96 % (06/03 0600) Weight:  [83.5 kg (184 lb)] 83.5 kg (184 lb) (06/02 1334) Weight change:     Intake/Output from previous day: 06/02 0701 - 06/03 0700 In: 5828.8 [I.V.:2728.8; IV Piggyback:3100] Out: 1500 [Urine:1500]  PHYSICAL EXAM Constitutional: She is awake and alert and in no acute distress. She does look short of breath. Eyes: Her pupils react. Ears nose mouth and throat: Mucous membranes are slightly dry. Hearing is grossly normal. Cardiovascular: Her heart is regular with mild tachycardia no gallop no edema now. Respiratory: Her  respiratory effort is increased. She has rales diffusely. Gastrointestinal: Her abdomen is soft with no masses. Bowel sounds are present. Musculoskeletal: Her muscle strength is normal. Neurological: No focal abnormalities she does seem mildly confused. Skin: Warm and dry. Psychiatric: Normal mood and affect  Lab Results: Basic Metabolic Panel:  Recent Labs  81/19/14 1357 03/15/17 2030 03/15/17 2230 03/16/17 0421  NA 135  --   --  140  K 3.3*  --   --  3.4*  CL 102  --   --  112*  CO2 25  --   --  22  GLUCOSE 99  --   --  172*  BUN 8  --   --  6  CREATININE 1.13*  --  1.11* 0.80  CALCIUM 9.1  --   --  8.4*  MG  --  1.9  --   --     Liver Function Tests:  Recent Labs  03/15/17 1357 03/16/17 0421  AST 31 22  ALT 33 26  ALKPHOS 71 62  BILITOT 0.9 0.3  PROT 7.3 6.4*  ALBUMIN 4.3 3.6   No results for input(s): LIPASE, AMYLASE in the last 72 hours. No results for input(s): AMMONIA in the last 72 hours. CBC:  Recent Labs  03/15/17 1357 03/15/17 2230 03/16/17 0421  WBC 13.3* 13.2* 13.3*  NEUTROABS 10.8*  --   --   HGB 12.9 11.9* 11.4*  HCT 39.3 36.4 34.5*  MCV 89.3 90.5 88.9  PLT 218 172 216   Cardiac Enzymes:  Recent Labs  03/15/17 1357 03/15/17 1725  TROPONINI <0.03 <0.03   BNP: No results for input(s): PROBNP in the last 72 hours. D-Dimer:  Recent Labs  03/15/17 1357  DDIMER 0.30   CBG: No results for input(s): GLUCAP in the last 72 hours. Hemoglobin A1C: No results for input(s): HGBA1C in the last 72 hours. Fasting Lipid Panel: No results for input(s): CHOL, HDL, LDLCALC, TRIG, CHOLHDL, LDLDIRECT in the last 72 hours. Thyroid Function Tests: No results for input(s): TSH, T4TOTAL, FREET4, T3FREE, THYROIDAB in the last 72 hours. Anemia Panel: No results for input(s): VITAMINB12, FOLATE, FERRITIN, TIBC, IRON, RETICCTPCT in the last 72 hours. Coagulation: No results for input(s): LABPROT, INR in the last 72 hours. Urine Drug Screen: Drugs of Abuse  No results found for: LABOPIA, COCAINSCRNUR, LABBENZ, AMPHETMU, THCU, LABBARB  Alcohol Level: No results for input(s): ETH in the last 72 hours. Urinalysis:  Recent Labs  03/15/17 1346  COLORURINE YELLOW  LABSPEC 1.005  PHURINE 5.0  GLUCOSEU NEGATIVE  HGBUR LARGE*  BILIRUBINUR NEGATIVE  KETONESUR NEGATIVE  PROTEINUR NEGATIVE  NITRITE NEGATIVE  LEUKOCYTESUR NEGATIVE   Misc. Labs:   ABGS: No results for input(s): PHART, PO2ART, TCO2, HCO3 in the last 72 hours.  Invalid input(s): PCO2   MICROBIOLOGY: Recent Results (from the past 240 hour(s))  Culture, blood (routine x 2)     Status: None (Preliminary result)    Collection Time: 03/15/17  8:18 PM  Result Value Ref Range Status   Specimen Description RIGHT ANTECUBITAL  Final   Special Requests Blood Culture adequate volume  Final   Culture NO GROWTH < 12 HOURS  Final   Report Status PENDING  Incomplete  Culture, blood (routine x 2)     Status: None (Preliminary result)   Collection Time: 03/15/17 10:30 PM  Result Value Ref Range Status   Specimen Description SITE NOT SPECIFIED  Final   Special Requests   Final    Blood Culture results  may not be optimal due to an inadequate volume of blood received in culture bottles   Culture NO GROWTH < 12 HOURS  Final   Report Status PENDING  Incomplete    Studies/Results: Dg Chest 2 View  Result Date: 03/15/2017 CLINICAL DATA:  Shortness of breath and cough EXAM: CHEST  2 VIEW COMPARISON:  12/24/2013 FINDINGS: The lungs are clear wiithout focal pneumonia, edema, pneumothorax or pleural effusion. The cardiopericardial silhouette is within normal limits for size. The visualized bony structures of the thorax are intact. Telemetry leads overlie the chest. IMPRESSION: No active cardiopulmonary disease. Electronically Signed   By: Kennith Center M.D.   On: 03/15/2017 14:42   Ct Angio Chest Pe W Or Wo Contrast  Result Date: 03/15/2017 CLINICAL DATA:  Progressive shortness of breath and chest pain for 4 days. EXAM: CT ANGIOGRAPHY CHEST WITH CONTRAST TECHNIQUE: Multidetector CT imaging of the chest was performed using the standard protocol during bolus administration of intravenous contrast. Multiplanar CT image reconstructions and MIPs were obtained to evaluate the vascular anatomy. CONTRAST:  100 mL Isovue 370 COMPARISON:  09/24/2016 FINDINGS: Cardiovascular: Satisfactory opacification of pulmonary arteries noted, and no pulmonary emboli identified. No evidence of thoracic aortic dissection or aneurysm. Mediastinum/Nodes: No masses or pathologically enlarged lymph nodes identified. Shotty less than 1 cm mediastinal and hilar  lymph nodes are seen, nonspecific and likely reactive in etiology. Lungs/Pleura: New diffuse interstitial infiltrates are seen, which may be due to edema or interstitial pneumonitis. Mild infiltrate or atelectasis seen in the posterior right lower lobe. Mild dependent atelectasis seen in the left lung base. No evidence of pleural effusion. Upper abdomen: No acute findings. Musculoskeletal: No suspicious bone lesions identified. Review of the MIP images confirms the above findings. IMPRESSION: No evidence of pulmonary embolism. New bilateral diffuse interstitial infiltrates, which may be due to pulmonary edema or interstitial pneumonitis. Mild infiltrate or atelectasis in posterior right lower lobe. No evidence of pleural effusion. Electronically Signed   By: Myles Rosenthal M.D.   On: 03/15/2017 18:20    Medications:  Prior to Admission:  Prescriptions Prior to Admission  Medication Sig Dispense Refill Last Dose  . buPROPion (WELLBUTRIN XL) 300 MG 24 hr tablet Take 300 mg by mouth at bedtime.   03/14/2017 at Unknown time  . clonazePAM (KLONOPIN) 0.5 MG tablet Take 0.5 mg by mouth at bedtime as needed for anxiety (and/or sleep).    03/14/2017 at Unknown time  . cyclobenzaprine (FLEXERIL) 10 MG tablet Take 10 mg by mouth 3 (three) times daily as needed for muscle spasms.   Past Month at Unknown time  . gabapentin (NEURONTIN) 600 MG tablet Take 600 mg by mouth 3 (three) times daily as needed (for pain).    03/14/2017 at Unknown time  . hydroxychloroquine (PLAQUENIL) 200 MG tablet Take 400 mg by mouth at bedtime.   Past Week at Unknown time  . lamoTRIgine (LAMICTAL) 200 MG tablet Take 300 mg by mouth at bedtime.    03/14/2017 at Unknown time  . levothyroxine (SYNTHROID, LEVOTHROID) 137 MCG tablet Take 137 mcg by mouth at bedtime.    03/14/2017 at Unknown time  . meclizine (ANTIVERT) 25 MG tablet Take 25 mg by mouth 3 (three) times daily as needed for dizziness.   Past Week at Unknown time  . ondansetron (ZOFRAN) 4 MG  tablet Take 4 mg by mouth every 8 (eight) hours as needed for nausea or vomiting.   Past Week at Unknown time  . traMADol (ULTRAM) 50 MG  tablet Take 50-100 mg by mouth every 6 (six) hours as needed for moderate pain.    03/14/2017 at 1845  . traZODone (DESYREL) 100 MG tablet Take 100 mg by mouth at bedtime.   03/14/2017 at Unknown time   Scheduled: . buPROPion  300 mg Oral QHS  . enoxaparin (LOVENOX) injection  40 mg Subcutaneous Q24H  . gabapentin  600 mg Oral TID  . lamoTRIgine  200 mg Oral QHS  . levothyroxine  137 mcg Oral QAC breakfast  . methylPREDNISolone (SOLU-MEDROL) injection  60 mg Intravenous Q6H   Continuous: . sodium chloride 100 mL/hr at 03/16/17 0212  . ceFEPime (MAXIPIME) IV 1 g (03/16/17 0615)  . vancomycin    . vancomycin 1,000 mg (03/16/17 0731)   ZOX:[id number]:acetaminophen **OR** acetaminophen, clonazePAM, HYDROmorphone (DILAUDID) injection, iopamidol, ipratropium-albuterol, morphine injection, zolpidem  Assesment: She has what looks like an acute pneumonia. It is an unusual pattern on her CT with a mostly interstitial prominence. She is on cefepime and vancomycin but I think we need to add Levaquin. Agree with steroids. Continue with other treatments. Her lactate was up and she's been treated with fluid boluses. Her blood pressure looks stable now. Active Problems:   Irritable bowel syndrome   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HCC)   Sjogren's syndrome with lung involvement (HCC)    Plan: Add Levaquin. Continue steroids. Add flutter valve. Check for Legionella and mycoplasma    LOS: 1 day   Andy Allende L 03/16/2017, 7:50 AM

## 2017-03-16 NOTE — Progress Notes (Signed)
  Echocardiogram 2D Echocardiogram with definity has been performed.  Leta JunglingCooper, Kamla Skilton M 03/16/2017, 9:11 AM

## 2017-03-16 NOTE — Progress Notes (Signed)
Pharmacy Antibiotic Note  Marisa Johnson is a 41 y.o. female admitted on 03/15/2017 with pneumonia.  Pharmacy has been consulted for Vancomycin, Cefepime, and levaquin dosing.  Plan: Vancomycin 1500mg  loading dose given in ED, then 1gm  IV every 12 hours.  Goal trough 15-20 mcg/mL.  Cefepime 1gm IV q8h Levaquin 750mg  IV q24h F/U cxs and clinical progress Monitor V/S, labs, and levels as indicated  Height: 5\' 3"  (160 cm) Weight: 184 lb (83.5 kg) IBW/kg (Calculated) : 52.4  Temp (24hrs), Avg:98.2 F (36.8 C), Min:97.7 F (36.5 C), Max:99.5 F (37.5 C)   Recent Labs Lab 03/15/17 1357 03/15/17 2030 03/15/17 2230 03/16/17 0111 03/16/17 0421 03/16/17 0751  WBC 13.3*  --  13.2*  --  13.3*  --   CREATININE 1.13*  --  1.11*  --  0.80  --   LATICACIDVEN 1.6 8.7*  --  5.0*  --  1.0    Estimated Creatinine Clearance: 95.6 mL/min (by C-G formula based on SCr of 0.8 mg/dL).    Allergies  Allergen Reactions  . Abilify [Aripiprazole] Other (See Comments)    Reaction:  Restless leg syndrome   . Reglan [Metoclopramide] Other (See Comments)    Reaction:  Hallucinations   . Dexamethasone Rash    Antimicrobials this admission: Vancomycin 6/2 >>  Cefepime 6/2 >>  Levaquin 6/3>>  Dose adjustments this admission: N/A  Microbiology results: 6/2 BCx: ngtd 6/3 MRSA PCR is negative  Thank you for [facility] pharmacy to be a part of this patient's care.  Elder CyphersLorie Derrious Bologna, BS Pharm D, [place] Clinical Pharmacist Pager [phone]  03/16/2017 9:35 AM

## 2017-03-16 NOTE — Progress Notes (Signed)
CRITICAL VALUE ALERT  Critical Value:  Lactic acid 5.0  Date & Time Notied:  03/16/17 @ 0155  Provider Notified: [id number]  Orders Received/Actions taken: Fluids changed from rate of 125 to rate of 100

## 2017-03-16 NOTE — Progress Notes (Signed)
[facility] will be getting patient in room 308. Tech transported by wheelchair to room with IV fluid still infusing.

## 2017-03-16 NOTE — Progress Notes (Addendum)
PROGRESS NOTE    Marisa Johnson  WUJ:[id number] DOB: [birth year] DOA: 03/15/2017 PCP: Benita Stabile, MD     Brief Narrative:  41 y/o woman admitted from home on 6/2 due to cough, SOB. Found to have a PNA on CT chest and admission requested.   Assessment & Plan:   Active Problems:   Irritable bowel syndrome   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HCC)   Sjogren's syndrome with lung involvement (HCC)   Acute Hypoxemic Respiratory Failure -Due to CAP. -See below for details. -Wean oxygen as tolerated.  CAP -Agree with addition of levaquin given possibility of atypical pathogens. -Continue cefepime for another 24 hours, but will DC vanc given negative MRSA PCR. -Cx data remains negative to date. -Will discuss with Dr. Juanetta Gosling whether steroids need to be continued.  Hypothyroidism -Continue synthroid.   DVT prophylaxis: lovenox Code Status: full code Family Communication: mother at bedside updated on plan of care and all questions answered Disposition Plan: anticipate DC home in 24-48 hours  Consultants:   Pulmonary, Dr. Juanetta Gosling  Procedures:   None  Antimicrobials:  Anti-infectives    Start     Dose/Rate Route Frequency Ordered Stop   03/16/17 1000  levofloxacin (LEVAQUIN) IVPB 750 mg     750 mg 100 mL/hr over 90 Minutes Intravenous Every 24 hours 03/16/17 0939     03/16/17 0800  vancomycin (VANCOCIN) IVPB 1000 mg/200 mL premix     1,000 mg 200 mL/hr over 60 Minutes Intravenous Every 12 hours 03/15/17 2214     03/16/17 0600  ceFEPIme (MAXIPIME) 1 g in dextrose 5 % 50 mL IVPB     1 g 100 mL/hr over 30 Minutes Intravenous Every 8 hours 03/15/17 2214     03/15/17 1930  vancomycin (VANCOCIN) 1,500 mg in sodium chloride 0.9 % 500 mL IVPB     1,500 mg 250 mL/hr over 120 Minutes Intravenous  Once 03/15/17 1910     03/15/17 1845  ceFEPIme (MAXIPIME) 1 g in dextrose 5 % 50 mL IVPB     1 g 100 mL/hr over 30 Minutes Intravenous  Once 03/15/17 1832 03/15/17 1915        Subjective: Feels "much better".  Objective: Vitals:   03/16/17 0800 03/16/17 0900 03/16/17 1255 03/16/17 1340  BP:  112/70  114/67  Pulse: 81 79  65  Resp: 20 16  15   Temp: 97.9 F (36.6 C)  98.1 F (36.7 C) 98.5 F (36.9 C)  TempSrc: Axillary  Oral Oral  SpO2: 95% 96%  94%  Weight:      Height:        Intake/Output Summary (Last 24 hours) at 03/16/17 1427 Last data filed at 03/16/17 1100  Gross per 24 hour  Intake          6228.76 ml  Output             2050 ml  Net          4178.76 ml   Filed Weights   03/15/17 1334  Weight: 83.5 kg (184 lb)    Examination:  General exam: Alert, awake, oriented x 3 Respiratory system: bilateral crackles Cardiovascular system:RRR. No murmurs, rubs, gallops. Gastrointestinal system: Abdomen is nondistended, soft and nontender. No organomegaly or masses felt. Normal bowel sounds heard. Central nervous system: Alert and oriented. No focal neurological deficits. Extremities: No C/C/E, +pedal pulses Skin: No rashes, lesions or ulcers Psychiatry: Judgement and insight appear normal. Mood & affect appropriate.     Data Reviewed: I  have personally reviewed following labs and imaging studies  CBC:  Recent Labs Lab 03/15/17 1357 03/15/17 2230 03/16/17 0421  WBC 13.3* 13.2* 13.3*  NEUTROABS 10.8*  --   --   HGB 12.9 11.9* 11.4*  HCT 39.3 36.4 34.5*  MCV 89.3 90.5 88.9  PLT 218 172 216   Basic Metabolic Panel:  Recent Labs Lab 03/15/17 1357 03/15/17 2030 03/15/17 2230 03/16/17 0421  NA 135  --   --  140  K 3.3*  --   --  3.4*  CL 102  --   --  112*  CO2 25  --   --  22  GLUCOSE 99  --   --  172*  BUN 8  --   --  6  CREATININE 1.13*  --  1.11* 0.80  CALCIUM 9.1  --   --  8.4*  MG  --  1.9  --   --    GFR: Estimated Creatinine Clearance: 95.6 mL/min (by C-G formula based on SCr of 0.8 mg/dL). Liver Function Tests:  Recent Labs Lab 03/15/17 1357 03/16/17 0421  AST 31 22  ALT 33 26  ALKPHOS 71 62   BILITOT 0.9 0.3  PROT 7.3 6.4*  ALBUMIN 4.3 3.6   No results for input(s): LIPASE, AMYLASE in the last 168 hours. No results for input(s): AMMONIA in the last 168 hours. Coagulation Profile: No results for input(s): INR, PROTIME in the last 168 hours. Cardiac Enzymes:  Recent Labs Lab 03/15/17 1357 03/15/17 1725  TROPONINI <0.03 <0.03   BNP (last 3 results) No results for input(s): PROBNP in the last 8760 hours. HbA1C: No results for input(s): HGBA1C in the last 72 hours. CBG: No results for input(s): GLUCAP in the last 168 hours. Lipid Profile: No results for input(s): CHOL, HDL, LDLCALC, TRIG, CHOLHDL, LDLDIRECT in the last 72 hours. Thyroid Function Tests: No results for input(s): TSH, T4TOTAL, FREET4, T3FREE, THYROIDAB in the last 72 hours. Anemia Panel: No results for input(s): VITAMINB12, FOLATE, FERRITIN, TIBC, IRON, RETICCTPCT in the last 72 hours. Urine analysis:    Component Value Date/Time   COLORURINE YELLOW 03/15/2017 1346   APPEARANCEUR CLEAR 03/15/2017 1346   LABSPEC 1.005 03/15/2017 1346   PHURINE 5.0 03/15/2017 1346   GLUCOSEU NEGATIVE 03/15/2017 1346   HGBUR LARGE (A) 03/15/2017 1346   BILIRUBINUR NEGATIVE 03/15/2017 1346   KETONESUR NEGATIVE 03/15/2017 1346   PROTEINUR NEGATIVE 03/15/2017 1346   UROBILINOGEN 0.2 07/12/2015 0931   NITRITE NEGATIVE 03/15/2017 1346   LEUKOCYTESUR NEGATIVE 03/15/2017 1346   Sepsis Labs: @LABRCNTIP (procalcitonin:4,lacticidven:4)  ) Recent Results (from the past 240 hour(s))  Culture, blood (routine x 2)     Status: None (Preliminary result)   Collection Time: 03/15/17  8:18 PM  Result Value Ref Range Status   Specimen Description RIGHT ANTECUBITAL  Final   Special Requests Blood Culture adequate volume  Final   Culture NO GROWTH < 24 HOURS  Final   Report Status PENDING  Incomplete  Culture, blood (routine x 2)     Status: None (Preliminary result)   Collection Time: 03/15/17 10:30 PM  Result Value Ref Range  Status   Specimen Description SITE NOT SPECIFIED  Final   Special Requests   Final    Blood Culture results may not be optimal due to an inadequate volume of blood received in culture bottles   Culture NO GROWTH < 12 HOURS  Final   Report Status PENDING  Incomplete  MRSA PCR Screening  Status: None   Collection Time: 03/16/17  4:20 AM  Result Value Ref Range Status   MRSA by PCR NEGATIVE NEGATIVE Final    Comment:        The GeneXpert MRSA Assay (FDA approved for NASAL specimens only), is one component of a comprehensive MRSA colonization surveillance program. It is not intended to diagnose MRSA infection nor to guide or monitor treatment for MRSA infections.          Radiology Studies: Dg Chest 2 View  Result Date: 03/15/2017 CLINICAL DATA:  Shortness of breath and cough EXAM: CHEST  2 VIEW COMPARISON:  12/24/2013 FINDINGS: The lungs are clear wiithout focal pneumonia, edema, pneumothorax or pleural effusion. The cardiopericardial silhouette is within normal limits for size. The visualized bony structures of the thorax are intact. Telemetry leads overlie the chest. IMPRESSION: No active cardiopulmonary disease. Electronically Signed   By: Kennith Center M.D.   On: 03/15/2017 14:42   Ct Angio Chest Pe W Or Wo Contrast  Result Date: 03/15/2017 CLINICAL DATA:  Progressive shortness of breath and chest pain for 4 days. EXAM: CT ANGIOGRAPHY CHEST WITH CONTRAST TECHNIQUE: Multidetector CT imaging of the chest was performed using the standard protocol during bolus administration of intravenous contrast. Multiplanar CT image reconstructions and MIPs were obtained to evaluate the vascular anatomy. CONTRAST:  100 mL Isovue 370 COMPARISON:  09/24/2016 FINDINGS: Cardiovascular: Satisfactory opacification of pulmonary arteries noted, and no pulmonary emboli identified. No evidence of thoracic aortic dissection or aneurysm. Mediastinum/Nodes: No masses or pathologically enlarged lymph nodes  identified. Shotty less than 1 cm mediastinal and hilar lymph nodes are seen, nonspecific and likely reactive in etiology. Lungs/Pleura: New diffuse interstitial infiltrates are seen, which may be due to edema or interstitial pneumonitis. Mild infiltrate or atelectasis seen in the posterior right lower lobe. Mild dependent atelectasis seen in the left lung base. No evidence of pleural effusion. Upper abdomen: No acute findings. Musculoskeletal: No suspicious bone lesions identified. Review of the MIP images confirms the above findings. IMPRESSION: No evidence of pulmonary embolism. New bilateral diffuse interstitial infiltrates, which may be due to pulmonary edema or interstitial pneumonitis. Mild infiltrate or atelectasis in posterior right lower lobe. No evidence of pleural effusion. Electronically Signed   By: Myles Rosenthal M.D.   On: 03/15/2017 18:20        Scheduled Meds: . buPROPion  300 mg Oral QHS  . enoxaparin (LOVENOX) injection  40 mg Subcutaneous Q24H  . gabapentin  600 mg Oral TID  . lamoTRIgine  200 mg Oral QHS  . levothyroxine  137 mcg Oral QAC breakfast  . methylPREDNISolone (SOLU-MEDROL) injection  60 mg Intravenous Q6H   Continuous Infusions: . sodium chloride 1,000 mL (03/16/17 1020)  . ceFEPime (MAXIPIME) IV Stopped (03/16/17 1256)  . levofloxacin (LEVAQUIN) IV Stopped (03/16/17 1153)  . vancomycin    . vancomycin Stopped (03/16/17 0831)     LOS: 1 day    Time spent: 25 minutes. Greater than 50% of this time was spent in direct contact with the patient coordinating care.     Chaya Jan, MD [facility] Pager [phone]  If 7PM-7AM, please contact night-coverage [email or web address] Password [email or web address] 03/16/2017, 2:27 PM

## 2017-03-17 ENCOUNTER — Inpatient Hospital Stay (HOSPITAL_COMMUNITY): Payer: 59

## 2017-03-17 LAB — BLOOD GAS, ARTERIAL
Acid-base deficit: 0.6 mmol/L (ref 0.0–2.0)
BICARBONATE: 24.5 mmol/L (ref 20.0–28.0)
Drawn by: 270161
FIO2: 21
O2 Saturation: 89.9 %
PCO2 ART: 29.1 mmHg — AB (ref 32.0–48.0)
PH ART: 7.496 — AB (ref 7.350–7.450)
PO2 ART: 54.1 mmHg — AB (ref 83.0–108.0)
Patient temperature: 37

## 2017-03-17 LAB — HIV ANTIBODY (ROUTINE TESTING W REFLEX): HIV Screen 4th Generation wRfx: NONREACTIVE

## 2017-03-17 LAB — LEGIONELLA PNEUMOPHILA SEROGP 1 UR AG: L. pneumophila Serogp 1 Ur Ag: NEGATIVE

## 2017-03-17 LAB — MYCOPLASMA PNEUMONIAE ANTIBODY, IGM

## 2017-03-17 MED ORDER — FUROSEMIDE 10 MG/ML IJ SOLN
40.0000 mg | Freq: Once | INTRAMUSCULAR | Status: AC
  Administered 2017-03-17: 40 mg via INTRAVENOUS
  Filled 2017-03-17: qty 4

## 2017-03-17 NOTE — Progress Notes (Signed)
Subjective: She says she doesn't feel as well. She feels tight in her chest. Her breathing is not as good. She complains of headache. She has some chest pain that is intermittent.  Objective: Vital signs in last 24 hours: Temp:  [98 F (36.7 C)-98.7 F (37.1 C)] 98.7 F (37.1 C) (06/04 5361) Pulse Rate:  [64-79] 64 (06/04 0632) Resp:  [15-16] 16 (06/04 4431) BP: (100-124)/(55-72) 124/72 (06/04 5400) SpO2:  [94 %-96 %] 95 % (06/04 8676) Weight change:     Intake/Output from previous day: 06/03 0701 - 06/04 0700 In: 3133.3 [P.O.:240; I.V.:2393.3; IV Piggyback:500] Out: 1250 [Urine:1250]  PHYSICAL EXAM General appearance: alert, cooperative and Mild to moderate distress. She seems anxious Resp: She is moving air well and doesn't have a lot of crackles now. Cardio: regular rate and rhythm, S1, S2 normal, no murmur, click, rub or gallop GI: soft, non-tender; bowel sounds normal; no masses,  no organomegaly Extremities: extremities normal, atraumatic, no cyanosis or edema Mucous membranes are moist  Lab Results:  Results for orders placed or performed during the hospital encounter of 03/15/17 (from the past 48 hour(s))  Urinalysis, Routine w reflex microscopic     Status: Abnormal   Collection Time: 03/15/17  1:46 PM  Result Value Ref Range   Color, Urine YELLOW YELLOW   APPearance CLEAR CLEAR   Specific Gravity, Urine 1.005 1.005 - 1.030   pH 5.0 5.0 - 8.0   Glucose, UA NEGATIVE NEGATIVE mg/dL   Hgb urine dipstick LARGE (A) NEGATIVE   Bilirubin Urine NEGATIVE NEGATIVE   Ketones, ur NEGATIVE NEGATIVE mg/dL   Protein, ur NEGATIVE NEGATIVE mg/dL   Nitrite NEGATIVE NEGATIVE   Leukocytes, UA NEGATIVE NEGATIVE   RBC / HPF 0-5 0 - 5 RBC/hpf   WBC, UA 0-5 0 - 5 WBC/hpf   Bacteria, UA NONE SEEN NONE SEEN   Squamous Epithelial / LPF NONE SEEN NONE SEEN   Mucous PRESENT   Pregnancy, urine     Status: None   Collection Time: 03/15/17  1:56 PM  Result Value Ref Range   Preg Test,  Ur NEGATIVE NEGATIVE    Comment:        THE SENSITIVITY OF THIS METHODOLOGY IS >20 mIU/mL.   Comprehensive metabolic panel     Status: Abnormal   Collection Time: 03/15/17  1:57 PM  Result Value Ref Range   Sodium 135 135 - 145 mmol/L   Potassium 3.3 (L) 3.5 - 5.1 mmol/L   Chloride 102 101 - 111 mmol/L   CO2 25 22 - 32 mmol/L   Glucose, Bld 99 65 - 99 mg/dL   BUN 8 6 - 20 mg/dL   Creatinine, Ser 1.13 (H) 0.44 - 1.00 mg/dL   Calcium 9.1 8.9 - 10.3 mg/dL   Total Protein 7.3 6.5 - 8.1 g/dL   Albumin 4.3 3.5 - 5.0 g/dL   AST 31 15 - 41 U/L   ALT 33 14 - 54 U/L   Alkaline Phosphatase 71 38 - 126 U/L   Total Bilirubin 0.9 0.3 - 1.2 mg/dL   GFR calc non Af Amer 60 (L) >60 mL/min   GFR calc Af Amer >60 >60 mL/min    Comment: (NOTE) The eGFR has been calculated using the CKD EPI equation. This calculation has not been validated in all clinical situations. eGFR's persistently <60 mL/min signify possible Chronic Kidney Disease.    Anion gap 8 5 - 15  CBC WITH DIFFERENTIAL     Status: Abnormal  Collection Time: 03/15/17  1:57 PM  Result Value Ref Range   WBC 13.3 (H) 4.0 - 10.5 K/uL   RBC 4.40 3.87 - 5.11 MIL/uL   Hemoglobin 12.9 12.0 - 15.0 g/dL   HCT 39.3 36.0 - 46.0 %   MCV 89.3 78.0 - 100.0 fL   MCH 29.3 26.0 - 34.0 pg   MCHC 32.8 30.0 - 36.0 g/dL   RDW 13.1 11.5 - 15.5 %   Platelets 218 150 - 400 K/uL   Neutrophils Relative % 81 %   Neutro Abs 10.8 (H) 1.7 - 7.7 K/uL   Lymphocytes Relative 13 %   Lymphs Abs 1.7 0.7 - 4.0 K/uL   Monocytes Relative 3 %   Monocytes Absolute 0.4 0.1 - 1.0 K/uL   Eosinophils Relative 3 %   Eosinophils Absolute 0.4 0.0 - 0.7 K/uL   Basophils Relative 0 %   Basophils Absolute 0.0 0.0 - 0.1 K/uL  Troponin I     Status: None   Collection Time: 03/15/17  1:57 PM  Result Value Ref Range   Troponin I <0.03 <0.03 ng/mL  D-dimer, quantitative (not at [facility])     Status: None   Collection Time: 03/15/17  1:57 PM  Result Value Ref Range   D-Dimer,  Quant 0.30 0.00 - 0.50 ug/mL-FEU    Comment: (NOTE) At the manufacturer cut-off of 0.50 ug/mL FEU, this assay has been documented to exclude PE with a sensitivity and negative predictive value of 97 to 99%.  At this time, this assay has not been approved by the FDA to exclude DVT/VTE. Results should be correlated with clinical presentation.   Lactic acid, plasma     Status: None   Collection Time: 03/15/17  1:57 PM  Result Value Ref Range   Lactic Acid, Venous 1.6 0.5 - 1.9 mmol/L  Brain natriuretic peptide     Status: None   Collection Time: 03/15/17  1:57 PM  Result Value Ref Range   B Natriuretic Peptide 41.0 0.0 - 100.0 pg/mL  Troponin I     Status: None   Collection Time: 03/15/17  5:25 PM  Result Value Ref Range   Troponin I <0.03 <0.03 ng/mL  Culture, blood (routine x 2)     Status: None (Preliminary result)   Collection Time: 03/15/17  8:18 PM  Result Value Ref Range   Specimen Description RIGHT ANTECUBITAL    Special Requests Blood Culture adequate volume    Culture NO GROWTH < 24 HOURS    Report Status PENDING   Lactic acid, plasma     Status: Abnormal   Collection Time: 03/15/17  8:30 PM  Result Value Ref Range   Lactic Acid, Venous 8.7 (HH) 0.5 - 1.9 mmol/L    Comment: CRITICAL RESULT CALLED TO, READ BACK BY AND VERIFIED WITH:  BELTON,K @ 2112 ON 03/15/17 BY JUW   Magnesium     Status: None   Collection Time: 03/15/17  8:30 PM  Result Value Ref Range   Magnesium 1.9 1.7 - 2.4 mg/dL  Culture, blood (routine x 2)     Status: None (Preliminary result)   Collection Time: 03/15/17 10:30 PM  Result Value Ref Range   Specimen Description SITE NOT SPECIFIED    Special Requests      Blood Culture results may not be optimal due to an inadequate volume of blood received in culture bottles   Culture NO GROWTH < 12 HOURS    Report Status PENDING   CBC  Status: Abnormal   Collection Time: 03/15/17 10:30 PM  Result Value Ref Range   WBC 13.2 (H) 4.0 - 10.5 K/uL   RBC  4.02 3.87 - 5.11 MIL/uL   Hemoglobin 11.9 (L) 12.0 - 15.0 g/dL   HCT 36.4 36.0 - 46.0 %   MCV 90.5 78.0 - 100.0 fL   MCH 29.6 26.0 - 34.0 pg   MCHC 32.7 30.0 - 36.0 g/dL   RDW 13.3 11.5 - 15.5 %   Platelets 172 150 - 400 K/uL  Creatinine, serum     Status: Abnormal   Collection Time: 03/15/17 10:30 PM  Result Value Ref Range   Creatinine, Ser 1.11 (H) 0.44 - 1.00 mg/dL   GFR calc non Af Amer >60 >60 mL/min   GFR calc Af Amer >60 >60 mL/min    Comment: (NOTE) The eGFR has been calculated using the CKD EPI equation. This calculation has not been validated in all clinical situations. eGFR's persistently <60 mL/min signify possible Chronic Kidney Disease.   Lactic acid, plasma     Status: Abnormal   Collection Time: 03/16/17  1:11 AM  Result Value Ref Range   Lactic Acid, Venous 5.0 (HH) 0.5 - 1.9 mmol/L    Comment: CRITICAL RESULT CALLED TO, READ BACK BY AND VERIFIED WITH:  FREEMAN,L @ 0147 ON 03/16/17 BY JUW   MRSA PCR Screening     Status: None   Collection Time: 03/16/17  4:20 AM  Result Value Ref Range   MRSA by PCR NEGATIVE NEGATIVE    Comment:        The GeneXpert MRSA Assay (FDA approved for NASAL specimens only), is one component of a comprehensive MRSA colonization surveillance program. It is not intended to diagnose MRSA infection nor to guide or monitor treatment for MRSA infections.   CBC     Status: Abnormal   Collection Time: 03/16/17  4:21 AM  Result Value Ref Range   WBC 13.3 (H) 4.0 - 10.5 K/uL   RBC 3.88 3.87 - 5.11 MIL/uL   Hemoglobin 11.4 (L) 12.0 - 15.0 g/dL   HCT 34.5 (L) 36.0 - 46.0 %   MCV 88.9 78.0 - 100.0 fL   MCH 29.4 26.0 - 34.0 pg   MCHC 33.0 30.0 - 36.0 g/dL   RDW 13.3 11.5 - 15.5 %   Platelets 216 150 - 400 K/uL  Comprehensive metabolic panel     Status: Abnormal   Collection Time: 03/16/17  4:21 AM  Result Value Ref Range   Sodium 140 135 - 145 mmol/L   Potassium 3.4 (L) 3.5 - 5.1 mmol/L   Chloride 112 (H) 101 - 111 mmol/L   CO2  22 22 - 32 mmol/L   Glucose, Bld 172 (H) 65 - 99 mg/dL   BUN 6 6 - 20 mg/dL   Creatinine, Ser 0.80 0.44 - 1.00 mg/dL   Calcium 8.4 (L) 8.9 - 10.3 mg/dL   Total Protein 6.4 (L) 6.5 - 8.1 g/dL   Albumin 3.6 3.5 - 5.0 g/dL   AST 22 15 - 41 U/L   ALT 26 14 - 54 U/L   Alkaline Phosphatase 62 38 - 126 U/L   Total Bilirubin 0.3 0.3 - 1.2 mg/dL   GFR calc non Af Amer >60 >60 mL/min   GFR calc Af Amer >60 >60 mL/min    Comment: (NOTE) The eGFR has been calculated using the CKD EPI equation. This calculation has not been validated in all clinical situations. eGFR's persistently <60 mL/min  signify possible Chronic Kidney Disease.    Anion gap 6 5 - 15  Lactic acid, plasma     Status: None   Collection Time: 03/16/17  7:51 AM  Result Value Ref Range   Lactic Acid, Venous 1.0 0.5 - 1.9 mmol/L  Blood gas, arterial     Status: Abnormal   Collection Time: 03/17/17  8:06 AM  Result Value Ref Range   FIO2 21.00    pH, Arterial 7.496 (H) 7.350 - 7.450   pCO2 arterial 29.1 (L) 32.0 - 48.0 mmHg   pO2, Arterial 54.1 (L) 83.0 - 108.0 mmHg   Bicarbonate 24.5 20.0 - 28.0 mmol/L   Acid-base deficit 0.6 0.0 - 2.0 mmol/L   O2 Saturation 89.9 %   Patient temperature 37.0    Collection site RIGHT RADIAL    Drawn by [id number]    Sample type ARTERIAL DRAW    Allens test (pass/fail) PASS PASS    ABGS  Recent Labs  03/17/17 0806  PHART 7.496*  PO2ART 54.1*  HCO3 24.5   CULTURES Recent Results (from the past 240 hour(s))  Culture, blood (routine x 2)     Status: None (Preliminary result)   Collection Time: 03/15/17  8:18 PM  Result Value Ref Range Status   Specimen Description RIGHT ANTECUBITAL  Final   Special Requests Blood Culture adequate volume  Final   Culture NO GROWTH < 24 HOURS  Final   Report Status PENDING  Incomplete  Culture, blood (routine x 2)     Status: None (Preliminary result)   Collection Time: 03/15/17 10:30 PM  Result Value Ref Range Status   Specimen Description SITE  NOT SPECIFIED  Final   Special Requests   Final    Blood Culture results may not be optimal due to an inadequate volume of blood received in culture bottles   Culture NO GROWTH < 12 HOURS  Final   Report Status PENDING  Incomplete  MRSA PCR Screening     Status: None   Collection Time: 03/16/17  4:20 AM  Result Value Ref Range Status   MRSA by PCR NEGATIVE NEGATIVE Final    Comment:        The GeneXpert MRSA Assay (FDA approved for NASAL specimens only), is one component of a comprehensive MRSA colonization surveillance program. It is not intended to diagnose MRSA infection nor to guide or monitor treatment for MRSA infections.    Studies/Results: Dg Chest 2 View  Result Date: 03/15/2017 CLINICAL DATA:  Shortness of breath and cough EXAM: CHEST  2 VIEW COMPARISON:  12/24/2013 FINDINGS: The lungs are clear wiithout focal pneumonia, edema, pneumothorax or pleural effusion. The cardiopericardial silhouette is within normal limits for size. The visualized bony structures of the thorax are intact. Telemetry leads overlie the chest. IMPRESSION: No active cardiopulmonary disease. Electronically Signed   By: Misty Stanley M.D.   On: 03/15/2017 14:42   Ct Angio Chest Pe W Or Wo Contrast  Result Date: 03/15/2017 CLINICAL DATA:  Progressive shortness of breath and chest pain for 4 days. EXAM: CT ANGIOGRAPHY CHEST WITH CONTRAST TECHNIQUE: Multidetector CT imaging of the chest was performed using the standard protocol during bolus administration of intravenous contrast. Multiplanar CT image reconstructions and MIPs were obtained to evaluate the vascular anatomy. CONTRAST:  100 mL Isovue 370 COMPARISON:  09/24/2016 FINDINGS: Cardiovascular: Satisfactory opacification of pulmonary arteries noted, and no pulmonary emboli identified. No evidence of thoracic aortic dissection or aneurysm. Mediastinum/Nodes: No masses or pathologically enlarged lymph nodes  identified. Shotty less than 1 cm mediastinal and  hilar lymph nodes are seen, nonspecific and likely reactive in etiology. Lungs/Pleura: New diffuse interstitial infiltrates are seen, which may be due to edema or interstitial pneumonitis. Mild infiltrate or atelectasis seen in the posterior right lower lobe. Mild dependent atelectasis seen in the left lung base. No evidence of pleural effusion. Upper abdomen: No acute findings. Musculoskeletal: No suspicious bone lesions identified. Review of the MIP images confirms the above findings. IMPRESSION: No evidence of pulmonary embolism. New bilateral diffuse interstitial infiltrates, which may be due to pulmonary edema or interstitial pneumonitis. Mild infiltrate or atelectasis in posterior right lower lobe. No evidence of pleural effusion. Electronically Signed   By: Earle Gell M.D.   On: 03/15/2017 18:20    Medications:  Prior to Admission:  Prescriptions Prior to Admission  Medication Sig Dispense Refill Last Dose  . buPROPion (WELLBUTRIN XL) 300 MG 24 hr tablet Take 300 mg by mouth at bedtime.   03/14/2017 at Unknown time  . clonazePAM (KLONOPIN) 0.5 MG tablet Take 0.5 mg by mouth at bedtime as needed for anxiety (and/or sleep).    03/14/2017 at Unknown time  . cyclobenzaprine (FLEXERIL) 10 MG tablet Take 10 mg by mouth 3 (three) times daily as needed for muscle spasms.   Past Month at Unknown time  . gabapentin (NEURONTIN) 600 MG tablet Take 600 mg by mouth 3 (three) times daily as needed (for pain).    03/14/2017 at Unknown time  . hydroxychloroquine (PLAQUENIL) 200 MG tablet Take 400 mg by mouth at bedtime.   Past Week at Unknown time  . lamoTRIgine (LAMICTAL) 200 MG tablet Take 300 mg by mouth at bedtime.    03/14/2017 at Unknown time  . levothyroxine (SYNTHROID, LEVOTHROID) 137 MCG tablet Take 137 mcg by mouth at bedtime.    03/14/2017 at Unknown time  . meclizine (ANTIVERT) 25 MG tablet Take 25 mg by mouth 3 (three) times daily as needed for dizziness.   Past Week at Unknown time  . ondansetron (ZOFRAN)  4 MG tablet Take 4 mg by mouth every 8 (eight) hours as needed for nausea or vomiting.   Past Week at Unknown time  . traMADol (ULTRAM) 50 MG tablet Take 50-100 mg by mouth every 6 (six) hours as needed for moderate pain.    03/14/2017 at 1845  . traZODone (DESYREL) 100 MG tablet Take 100 mg by mouth at bedtime.   03/14/2017 at Unknown time   Scheduled: . buPROPion  300 mg Oral QHS  . enoxaparin (LOVENOX) injection  40 mg Subcutaneous Q24H  . gabapentin  600 mg Oral TID  . lamoTRIgine  200 mg Oral QHS  . levothyroxine  137 mcg Oral QAC breakfast  . methylPREDNISolone (SOLU-MEDROL) injection  60 mg Intravenous Q6H   Continuous: . sodium chloride 100 mL/hr at 03/16/17 2216  . ceFEPime (MAXIPIME) IV Stopped (03/17/17 0554)  . levofloxacin (LEVAQUIN) IV Stopped (03/16/17 1153)  . vancomycin     XVQ:[id number] **OR** acetaminophen, clonazePAM, HYDROmorphone (DILAUDID) injection, iopamidol, ipratropium-albuterol, morphine injection, promethazine, zolpidem  Assesment: She was admitted with interstitial pneumonia. She has Sjogrens  Syndrome so she is somewhat immunocompromised. She is on vancomycin and Levaquin and cefepime. She has a history of posttraumatic stress disorder anxiety and bipolar disorder and she seems more anxious. She says her breathing is not as good. Active Problems:   Irritable bowel syndrome   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HCC)   Sjogren's syndrome with lung involvement ([place])    Plan:  Repeat chest x-ray. Check arterial blood gas. Her echocardiogram looks okay    LOS: 2 days   Kaydi Kley L 03/17/2017, 8:20 AM

## 2017-03-17 NOTE — Progress Notes (Signed)
Patient complaining of chest tightness. Lungs appear clear with good air movement  for most part  some crackles lower fine. Kidney function good , appear dry at admit. Fluid running 100 ml/ hr.almost 24 now. Suggest decreasing fluid.Nursing notified. Albumin 3.5

## 2017-03-17 NOTE — Progress Notes (Signed)
PROGRESS NOTE    Marisa Johnson  ZOX:[id number] DOB: 1976-04-08 DOA: 03/15/2017 PCP: Benita StabileHall, John Z, MD     Brief Narrative:  41 y/o woman admitted from home on 6/2 due to cough, SOB. Found to have a PNA on CT chest and admission requested.   Assessment & Plan:   Active Problems:   Irritable bowel syndrome   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HCC)   Sjogren's syndrome with lung involvement (HCC)   Acute Hypoxemic Respiratory Failure -Due to CAP and now pulmonary vascular congestion due to excess IVF. -See below for details. -Wean oxygen as tolerated.  CAP -Continue cefepime and levaquin. Vanc has been discontinued due to negative MRSA PCR. -Cx data remains negative to date. -Will discuss with Dr. Juanetta GoslingHawkins whether steroids need to be continued (will continue for now given h/o Sjogren's).  Pulmonary Edema -Due to excess IVF. -ECHO: EF 60-65% with normal LV diastolic function parameters. -Will give lasix 40 IV x 1.  Hypothyroidism -Continue synthroid.   DVT prophylaxis: lovenox Code Status: full code Family Communication: patient only Disposition Plan: anticipate DC home in 24-48 hours  Consultants:   Pulmonary, Dr. Juanetta GoslingHawkins  Procedures:   None  Antimicrobials:  Anti-infectives    Start     Dose/Rate Route Frequency Ordered Stop   03/16/17 1000  levofloxacin (LEVAQUIN) IVPB 750 mg     750 mg 100 mL/hr over 90 Minutes Intravenous Every 24 hours 03/16/17 0939     03/16/17 0800  vancomycin (VANCOCIN) IVPB 1000 mg/200 mL premix  Status:  Discontinued     1,000 mg 200 mL/hr over 60 Minutes Intravenous Every 12 hours 03/15/17 2214 03/16/17 1430   03/16/17 0600  ceFEPIme (MAXIPIME) 1 g in dextrose 5 % 50 mL IVPB     1 g 100 mL/hr over 30 Minutes Intravenous Every 8 hours 03/15/17 2214     03/15/17 1930  vancomycin (VANCOCIN) 1,500 mg in sodium chloride 0.9 % 500 mL IVPB     1,500 mg 250 mL/hr over 120 Minutes Intravenous  Once 03/15/17 1910     03/15/17 1845  ceFEPIme  (MAXIPIME) 1 g in dextrose 5 % 50 mL IVPB     1 g 100 mL/hr over 30 Minutes Intravenous  Once 03/15/17 1832 03/15/17 1915       Subjective: This am had an episode of increased SOB and HA.  Objective: Vitals:   03/16/17 2131 03/16/17 2204 03/17/17 0537 03/17/17 0632  BP:  (!) 100/55  124/72  Pulse:  67  64  Resp:  16  16  Temp:  98 F (36.7 C)  98.7 F (37.1 C)  TempSrc:  Oral  Oral  SpO2: 96% 94% 95% 95%  Weight:      Height:        Intake/Output Summary (Last 24 hours) at 03/17/17 1058 Last data filed at 03/17/17 0750  Gross per 24 hour  Intake          2733.34 ml  Output             1700 ml  Net          1033.34 ml   Filed Weights   03/15/17 1334  Weight: 83.5 kg (184 lb)    Examination:  General exam: Alert, awake, oriented x 3 Respiratory system: Bilateral crackles Cardiovascular system:RRR. No murmurs, rubs, gallops. Gastrointestinal system: Abdomen is nondistended, soft and nontender. No organomegaly or masses felt. Normal bowel sounds heard. Central nervous system: Alert and oriented. No focal neurological deficits. Extremities:  No C/C/E, +pedal pulses Skin: No rashes, lesions or ulcers Psychiatry: Judgement and insight appear normal. Mood & affect appropriate.      Data Reviewed: I have personally reviewed following labs and imaging studies  CBC:  Recent Labs Lab 03/15/17 1357 03/15/17 2230 03/16/17 0421  WBC 13.3* 13.2* 13.3*  NEUTROABS 10.8*  --   --   HGB 12.9 11.9* 11.4*  HCT 39.3 36.4 34.5*  MCV 89.3 90.5 88.9  PLT 218 172 216   Basic Metabolic Panel:  Recent Labs Lab 03/15/17 1357 03/15/17 2030 03/15/17 2230 03/16/17 0421  NA 135  --   --  140  K 3.3*  --   --  3.4*  CL 102  --   --  112*  CO2 25  --   --  22  GLUCOSE 99  --   --  172*  BUN 8  --   --  6  CREATININE 1.13*  --  1.11* 0.80  CALCIUM 9.1  --   --  8.4*  MG  --  1.9  --   --    GFR: Estimated Creatinine Clearance: 95.6 mL/min (by C-G formula based on SCr  of 0.8 mg/dL). Liver Function Tests:  Recent Labs Lab 03/15/17 1357 03/16/17 0421  AST 31 22  ALT 33 26  ALKPHOS 71 62  BILITOT 0.9 0.3  PROT 7.3 6.4*  ALBUMIN 4.3 3.6   No results for input(s): LIPASE, AMYLASE in the last 168 hours. No results for input(s): AMMONIA in the last 168 hours. Coagulation Profile: No results for input(s): INR, PROTIME in the last 168 hours. Cardiac Enzymes:  Recent Labs Lab 03/15/17 1357 03/15/17 1725  TROPONINI <0.03 <0.03   BNP (last 3 results) No results for input(s): PROBNP in the last 8760 hours. HbA1C: No results for input(s): HGBA1C in the last 72 hours. CBG: No results for input(s): GLUCAP in the last 168 hours. Lipid Profile: No results for input(s): CHOL, HDL, LDLCALC, TRIG, CHOLHDL, LDLDIRECT in the last 72 hours. Thyroid Function Tests: No results for input(s): TSH, T4TOTAL, FREET4, T3FREE, THYROIDAB in the last 72 hours. Anemia Panel: No results for input(s): VITAMINB12, FOLATE, FERRITIN, TIBC, IRON, RETICCTPCT in the last 72 hours. Urine analysis:    Component Value Date/Time   COLORURINE YELLOW 03/15/2017 1346   APPEARANCEUR CLEAR 03/15/2017 1346   LABSPEC 1.005 03/15/2017 1346   PHURINE 5.0 03/15/2017 1346   GLUCOSEU NEGATIVE 03/15/2017 1346   HGBUR LARGE (A) 03/15/2017 1346   BILIRUBINUR NEGATIVE 03/15/2017 1346   KETONESUR NEGATIVE 03/15/2017 1346   PROTEINUR NEGATIVE 03/15/2017 1346   UROBILINOGEN 0.2 07/12/2015 0931   NITRITE NEGATIVE 03/15/2017 1346   LEUKOCYTESUR NEGATIVE 03/15/2017 1346   Sepsis Labs: @LABRCNTIP (procalcitonin:4,lacticidven:4)  ) Recent Results (from the past 240 hour(s))  Culture, blood (routine x 2)     Status: None (Preliminary result)   Collection Time: 03/15/17  8:18 PM  Result Value Ref Range Status   Specimen Description RIGHT ANTECUBITAL  Final   Special Requests Blood Culture adequate volume  Final   Culture NO GROWTH < 24 HOURS  Final   Report Status PENDING  Incomplete    Culture, blood (routine x 2)     Status: None (Preliminary result)   Collection Time: 03/15/17 10:30 PM  Result Value Ref Range Status   Specimen Description SITE NOT SPECIFIED  Final   Special Requests   Final    Blood Culture results may not be optimal due to an inadequate volume of  blood received in culture bottles   Culture NO GROWTH < 12 HOURS  Final   Report Status PENDING  Incomplete  MRSA PCR Screening     Status: None   Collection Time: 03/16/17  4:20 AM  Result Value Ref Range Status   MRSA by PCR NEGATIVE NEGATIVE Final    Comment:        The GeneXpert MRSA Assay (FDA approved for NASAL specimens only), is one component of a comprehensive MRSA colonization surveillance program. It is not intended to diagnose MRSA infection nor to guide or monitor treatment for MRSA infections.          Radiology Studies: Dg Chest 2 View  Result Date: 03/15/2017 CLINICAL DATA:  Shortness of breath and cough EXAM: CHEST  2 VIEW COMPARISON:  12/24/2013 FINDINGS: The lungs are clear wiithout focal pneumonia, edema, pneumothorax or pleural effusion. The cardiopericardial silhouette is within normal limits for size. The visualized bony structures of the thorax are intact. Telemetry leads overlie the chest. IMPRESSION: No active cardiopulmonary disease. Electronically Signed   By: Kennith Center M.D.   On: 03/15/2017 14:42   Ct Angio Chest Pe W Or Wo Contrast  Result Date: 03/15/2017 CLINICAL DATA:  Progressive shortness of breath and chest pain for 4 days. EXAM: CT ANGIOGRAPHY CHEST WITH CONTRAST TECHNIQUE: Multidetector CT imaging of the chest was performed using the standard protocol during bolus administration of intravenous contrast. Multiplanar CT image reconstructions and MIPs were obtained to evaluate the vascular anatomy. CONTRAST:  100 mL Isovue 370 COMPARISON:  09/24/2016 FINDINGS: Cardiovascular: Satisfactory opacification of pulmonary arteries noted, and no pulmonary emboli  identified. No evidence of thoracic aortic dissection or aneurysm. Mediastinum/Nodes: No masses or pathologically enlarged lymph nodes identified. Shotty less than 1 cm mediastinal and hilar lymph nodes are seen, nonspecific and likely reactive in etiology. Lungs/Pleura: New diffuse interstitial infiltrates are seen, which may be due to edema or interstitial pneumonitis. Mild infiltrate or atelectasis seen in the posterior right lower lobe. Mild dependent atelectasis seen in the left lung base. No evidence of pleural effusion. Upper abdomen: No acute findings. Musculoskeletal: No suspicious bone lesions identified. Review of the MIP images confirms the above findings. IMPRESSION: No evidence of pulmonary embolism. New bilateral diffuse interstitial infiltrates, which may be due to pulmonary edema or interstitial pneumonitis. Mild infiltrate or atelectasis in posterior right lower lobe. No evidence of pleural effusion. Electronically Signed   By: Myles Rosenthal M.D.   On: 03/15/2017 18:20   Dg Chest Port 1 View  Result Date: 03/17/2017 CLINICAL DATA:  Shortness of Breath EXAM: PORTABLE CHEST 1 VIEW COMPARISON:  March 15, 2017 chest radiograph and chest CT FINDINGS: There is mild generalized interstitial edema. No airspace consolidation. Heart is upper normal in size with pulmonary venous hypertension. No adenopathy. No bone lesions. IMPRESSION: There is mild generalized interstitial edema with changes of pulmonary vascular congestion. There may be a degree of congestive heart failure. No airspace consolidation. Electronically Signed   By: Bretta Bang III M.D.   On: 03/17/2017 09:03        Scheduled Meds: . buPROPion  300 mg Oral QHS  . enoxaparin (LOVENOX) injection  40 mg Subcutaneous Q24H  . furosemide  40 mg Intravenous Once  . gabapentin  600 mg Oral TID  . lamoTRIgine  200 mg Oral QHS  . levothyroxine  137 mcg Oral QAC breakfast  . methylPREDNISolone (SOLU-MEDROL) injection  60 mg Intravenous  Q6H   Continuous Infusions: . sodium chloride 100  mL/hr at 03/17/17 0926  . ceFEPime (MAXIPIME) IV Stopped (03/17/17 0554)  . levofloxacin (LEVAQUIN) IV Stopped (03/17/17 1052)  . vancomycin       LOS: 2 days    Time spent: 35 minutes. Greater than 50% of this time was spent in direct contact with the patient coordinating care.     Chaya Jan, MD [facility] Pager [phone]  If 7PM-7AM, please contact night-coverage [email or web address] Password [email or web address] 03/17/2017, 10:58 AM

## 2017-03-17 NOTE — ACP (Advance Care Planning) (Signed)
Assisted patient in completing her Advance Directives. A copy was placed in her chart to be scanned. The original/copies were given to her.

## 2017-03-18 MED ORDER — METHYLPREDNISOLONE SODIUM SUCC 40 MG IJ SOLR
40.0000 mg | INTRAMUSCULAR | Status: DC
  Administered 2017-03-18: 40 mg via INTRAVENOUS
  Filled 2017-03-18 (×2): qty 1

## 2017-03-18 MED ORDER — METHYLPREDNISOLONE SODIUM SUCC 40 MG IJ SOLR: 40.0000 mg | Freq: Two times a day (BID) | INTRAMUSCULAR | Status: DC

## 2017-03-18 MED ORDER — FUROSEMIDE 10 MG/ML IJ SOLN
40.0000 mg | Freq: Once | INTRAMUSCULAR | Status: AC
  Administered 2017-03-18: 40 mg via INTRAVENOUS
  Filled 2017-03-18: qty 4

## 2017-03-18 NOTE — Progress Notes (Signed)
Subjective: She says she feels a little better today. She has coughed up some sputum. She was hypoxic on blood gas yesterday. She appeared to be volume overloaded. She has some complaints of anxiety  Objective: Vital signs in last 24 hours: Temp:  [97.9 F (36.6 C)-98.5 F (36.9 C)] 98.2 F (36.8 C) (06/05 0500) Pulse Rate:  [50-76] 50 (06/05 0500) Resp:  [18-21] 18 (06/05 0500) BP: (123-138)/(56-72) 134/56 (06/05 0500) SpO2:  [94 %-98 %] 96 % (06/05 0500) Weight change:  Last BM Date: 03/14/17  Intake/Output from previous day: 06/04 0701 - 06/05 0700 In: 965 [I.V.:815; IV Piggyback:150] Out: 4100 [Urine:4100]  PHYSICAL EXAM General appearance: alert, cooperative and no distress Resp: She still has crackles bilaterally but less than yesterday Cardio: regular rate and rhythm, S1, S2 normal, no murmur, click, rub or gallop GI: soft, non-tender; bowel sounds normal; no masses,  no organomegaly Extremities: extremities normal, atraumatic, no cyanosis or edema Skin warm and dry  Lab Results:  Results for orders placed or performed during the hospital encounter of 03/15/17 (from the past 48 hour(s))  Legionella Pneumophila Serogp 1 Ur Ag     Status: None   Collection Time: 03/16/17  1:39 PM  Result Value Ref Range   L. pneumophila Serogp 1 Ur Ag Negative Negative    Comment: (NOTE) Presumptive negative for L. pneumophila serogroup 1 antigen in urine, suggesting no recent or current infection. Legionnaires' disease cannot be ruled out since other serogroups and species may also cause disease. Performed At: [facility] [address] [place], [place] [id number] Mila HomerHancock William F MD [phone]    Source of Sample URINE, CLEAN CATCH   Blood gas, arterial     Status: Abnormal   Collection Time: 03/17/17  8:06 AM  Result Value Ref Range   FIO2 21.00    pH, Arterial 7.496 (H) 7.350 - 7.450   pCO2 arterial 29.1 (L) 32.0 - 48.0 mmHg   pO2, Arterial 54.1 (L) 83.0 -  108.0 mmHg   Bicarbonate 24.5 20.0 - 28.0 mmol/L   Acid-base deficit 0.6 0.0 - 2.0 mmol/L   O2 Saturation 89.9 %   Patient temperature 37.0    Collection site RIGHT RADIAL    Drawn by [phone]    Sample type ARTERIAL DRAW    Allens test (pass/fail) PASS PASS    ABGS  Recent Labs  03/17/17 0806  PHART 7.496*  PO2ART 54.1*  HCO3 24.5   CULTURES Recent Results (from the past 240 hour(s))  Culture, blood (routine x 2)     Status: None (Preliminary result)   Collection Time: 03/15/17  8:18 PM  Result Value Ref Range Status   Specimen Description RIGHT ANTECUBITAL  Final   Special Requests Blood Culture adequate volume  Final   Culture NO GROWTH 3 DAYS  Final   Report Status PENDING  Incomplete  Culture, blood (routine x 2)     Status: None (Preliminary result)   Collection Time: 03/15/17 10:30 PM  Result Value Ref Range Status   Specimen Description SITE NOT SPECIFIED  Final   Special Requests   Final    Blood Culture results may not be optimal due to an inadequate volume of blood received in culture bottles   Culture NO GROWTH 3 DAYS  Final   Report Status PENDING  Incomplete  MRSA PCR Screening     Status: None   Collection Time: 03/16/17  4:20 AM  Result Value Ref Range Status   MRSA by PCR NEGATIVE  NEGATIVE Final    Comment:        The GeneXpert MRSA Assay (FDA approved for NASAL specimens only), is one component of a comprehensive MRSA colonization surveillance program. It is not intended to diagnose MRSA infection nor to guide or monitor treatment for MRSA infections.    Studies/Results: Dg Chest Port 1 View  Result Date: 03/17/2017 CLINICAL DATA:  Shortness of Breath EXAM: PORTABLE CHEST 1 VIEW COMPARISON:  March 15, 2017 chest radiograph and chest CT FINDINGS: There is mild generalized interstitial edema. No airspace consolidation. Heart is upper normal in size with pulmonary venous hypertension. No adenopathy. No bone lesions. IMPRESSION: There is mild  generalized interstitial edema with changes of pulmonary vascular congestion. There may be a degree of congestive heart failure. No airspace consolidation. Electronically Signed   By: Bretta Bang III M.D.   On: 03/17/2017 09:03    Medications:  Prior to Admission:  Prescriptions Prior to Admission  Medication Sig Dispense Refill Last Dose  . buPROPion (WELLBUTRIN XL) 300 MG 24 hr tablet Take 300 mg by mouth at bedtime.   03/14/2017 at Unknown time  . clonazePAM (KLONOPIN) 0.5 MG tablet Take 0.5 mg by mouth at bedtime as needed for anxiety (and/or sleep).    03/14/2017 at Unknown time  . cyclobenzaprine (FLEXERIL) 10 MG tablet Take 10 mg by mouth 3 (three) times daily as needed for muscle spasms.   Past Month at Unknown time  . gabapentin (NEURONTIN) 600 MG tablet Take 600 mg by mouth 3 (three) times daily as needed (for pain).    03/14/2017 at Unknown time  . hydroxychloroquine (PLAQUENIL) 200 MG tablet Take 400 mg by mouth at bedtime.   Past Week at Unknown time  . lamoTRIgine (LAMICTAL) 200 MG tablet Take 300 mg by mouth at bedtime.    03/14/2017 at Unknown time  . levothyroxine (SYNTHROID, LEVOTHROID) 137 MCG tablet Take 137 mcg by mouth at bedtime.    03/14/2017 at Unknown time  . meclizine (ANTIVERT) 25 MG tablet Take 25 mg by mouth 3 (three) times daily as needed for dizziness.   Past Week at Unknown time  . ondansetron (ZOFRAN) 4 MG tablet Take 4 mg by mouth every 8 (eight) hours as needed for nausea or vomiting.   Past Week at Unknown time  . traMADol (ULTRAM) 50 MG tablet Take 50-100 mg by mouth every 6 (six) hours as needed for moderate pain.    03/14/2017 at 1845  . traZODone (DESYREL) 100 MG tablet Take 100 mg by mouth at bedtime.   03/14/2017 at Unknown time   Scheduled: . buPROPion  300 mg Oral QHS  . enoxaparin (LOVENOX) injection  40 mg Subcutaneous Q24H  . gabapentin  600 mg Oral TID  . lamoTRIgine  200 mg Oral QHS  . levothyroxine  137 mcg Oral QAC breakfast  . methylPREDNISolone  (SOLU-MEDROL) injection  40 mg Intravenous Q12H   Continuous: . sodium chloride 100 mL/hr at 03/18/17 0637  . ceFEPime (MAXIPIME) IV 1 g (03/18/17 0547)  . levofloxacin (LEVAQUIN) IV Stopped (03/17/17 1052)   ZOX:[id number] **OR** acetaminophen, clonazePAM, HYDROmorphone (DILAUDID) injection, iopamidol, ipratropium-albuterol, morphine injection, promethazine, zolpidem  Assesment: She was admitted with an interstitial pneumonia. She is on appropriate treatment. She appeared to be volume overloaded yesterday and was treated with Lasix and she has improved. Agree with reducing steroids today and probably switching to oral prednisone assuming that she does okay on a reduced dose. Active Problems:   Irritable bowel syndrome  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HCC)   Sjogren's syndrome with lung involvement (HCC)    Plan: As above    LOS: 3 days   Marisa Johnson L 03/18/2017, 8:39 AM

## 2017-03-18 NOTE — Progress Notes (Signed)
PROGRESS NOTE    Marisa Johnson  DGU:[id number] DOB: 1976/01/12 DOA: 03/15/2017 PCP: Benita Stabile, MD     Brief Narrative:  41 y/o woman admitted from home on 6/2 due to cough, SOB. Found to have a PNA on CT chest and admission requested. On 6/4 c/o increased SOB and repeat CXR showed pulmonary vascular congestion.   Assessment & Plan:   Active Problems:   Irritable bowel syndrome   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HCC)   Sjogren's syndrome with lung involvement (HCC)   Acute Hypoxemic Respiratory Failure -Due to CAP and now pulmonary vascular congestion due to excess IVF. -See below for details. -Wean oxygen as tolerated.  CAP -Continue cefepime and levaquin. Vanc has been discontinued due to negative MRSA PCR. -Cx data remains negative to date. Consider DC cefepime soon. -Will titrate steroids starting today.  Pulmonary Edema -Due to excess IVF. -ECHO: EF 60-65% with normal LV diastolic function parameters. -NSL IVF. -Was given 1 dose of lasix 6/4, will give another dose today.  Hypothyroidism -Continue synthroid.   DVT prophylaxis: lovenox Code Status: full code Family Communication: patient only Disposition Plan: anticipate DC home in 24-48 hours  Consultants:   Pulmonary, Dr. Juanetta Gosling  Procedures:   None  Antimicrobials:  Anti-infectives    Start     Dose/Rate Route Frequency Ordered Stop   03/16/17 1000  levofloxacin (LEVAQUIN) IVPB 750 mg     750 mg 100 mL/hr over 90 Minutes Intravenous Every 24 hours 03/16/17 0939     03/16/17 0800  vancomycin (VANCOCIN) IVPB 1000 mg/200 mL premix  Status:  Discontinued     1,000 mg 200 mL/hr over 60 Minutes Intravenous Every 12 hours 03/15/17 2214 03/16/17 1430   03/16/17 0600  ceFEPIme (MAXIPIME) 1 g in dextrose 5 % 50 mL IVPB     1 g 100 mL/hr over 30 Minutes Intravenous Every 8 hours 03/15/17 2214     03/15/17 1930  vancomycin (VANCOCIN) 1,500 mg in sodium chloride 0.9 % 500 mL IVPB  Status:  Discontinued     1,500  mg 250 mL/hr over 120 Minutes Intravenous  Once 03/15/17 1910 03/17/17 1101   03/15/17 1845  ceFEPIme (MAXIPIME) 1 g in dextrose 5 % 50 mL IVPB     1 g 100 mL/hr over 30 Minutes Intravenous  Once 03/15/17 1832 03/15/17 1915       Subjective: Feels much better, less SOB. Wants to be better to attend her daughter's graduation over the weekend.  Objective: Vitals:   03/17/17 1123 03/17/17 1434 03/17/17 2127 03/18/17 0500  BP:  130/72 123/69 (!) 134/56  Pulse:  (!) 53 76 (!) 50  Resp:  (!) 21 18 18   Temp:  98.4 F (36.9 C) 97.9 F (36.6 C) 98.2 F (36.8 C)  TempSrc:  Oral Oral Oral  SpO2: 98% 94% 95% 96%  Weight:      Height:        Intake/Output Summary (Last 24 hours) at 03/18/17 1024 Last data filed at 03/18/17 0900  Gross per 24 hour  Intake             1205 ml  Output             3400 ml  Net            -2195 ml   Filed Weights   03/15/17 1334  Weight: 83.5 kg (184 lb)    Examination:  General exam: Alert, awake, oriented x 3 Respiratory system: Bilateral crackles improved from  yesterday. Cardiovascular system:RRR. No murmurs, rubs, gallops. Gastrointestinal system: Abdomen is nondistended, soft and nontender. No organomegaly or masses felt. Normal bowel sounds heard. Central nervous system: Alert and oriented. No focal neurological deficits. Extremities: No C/C/E, +pedal pulses Skin: No rashes, lesions or ulcers Psychiatry: Judgement and insight appear normal. Mood & affect appropriate.      Data Reviewed: I have personally reviewed following labs and imaging studies  CBC:  Recent Labs Lab 03/15/17 1357 03/15/17 2230 03/16/17 0421  WBC 13.3* 13.2* 13.3*  NEUTROABS 10.8*  --   --   HGB 12.9 11.9* 11.4*  HCT 39.3 36.4 34.5*  MCV 89.3 90.5 88.9  PLT 218 172 216   Basic Metabolic Panel:  Recent Labs Lab 03/15/17 1357 03/15/17 2030 03/15/17 2230 03/16/17 0421  NA 135  --   --  140  K 3.3*  --   --  3.4*  CL 102  --   --  112*  CO2 25  --    --  22  GLUCOSE 99  --   --  172*  BUN 8  --   --  6  CREATININE 1.13*  --  1.11* 0.80  CALCIUM 9.1  --   --  8.4*  MG  --  1.9  --   --    GFR: Estimated Creatinine Clearance: 95.6 mL/min (by C-G formula based on SCr of 0.8 mg/dL). Liver Function Tests:  Recent Labs Lab 03/15/17 1357 03/16/17 0421  AST 31 22  ALT 33 26  ALKPHOS 71 62  BILITOT 0.9 0.3  PROT 7.3 6.4*  ALBUMIN 4.3 3.6   No results for input(s): LIPASE, AMYLASE in the last 168 hours. No results for input(s): AMMONIA in the last 168 hours. Coagulation Profile: No results for input(s): INR, PROTIME in the last 168 hours. Cardiac Enzymes:  Recent Labs Lab 03/15/17 1357 03/15/17 1725  TROPONINI <0.03 <0.03   BNP (last 3 results) No results for input(s): PROBNP in the last 8760 hours. HbA1C: No results for input(s): HGBA1C in the last 72 hours. CBG: No results for input(s): GLUCAP in the last 168 hours. Lipid Profile: No results for input(s): CHOL, HDL, LDLCALC, TRIG, CHOLHDL, LDLDIRECT in the last 72 hours. Thyroid Function Tests: No results for input(s): TSH, T4TOTAL, FREET4, T3FREE, THYROIDAB in the last 72 hours. Anemia Panel: No results for input(s): VITAMINB12, FOLATE, FERRITIN, TIBC, IRON, RETICCTPCT in the last 72 hours. Urine analysis:    Component Value Date/Time   COLORURINE YELLOW 03/15/2017 1346   APPEARANCEUR CLEAR 03/15/2017 1346   LABSPEC 1.005 03/15/2017 1346   PHURINE 5.0 03/15/2017 1346   GLUCOSEU NEGATIVE 03/15/2017 1346   HGBUR LARGE (A) 03/15/2017 1346   BILIRUBINUR NEGATIVE 03/15/2017 1346   KETONESUR NEGATIVE 03/15/2017 1346   PROTEINUR NEGATIVE 03/15/2017 1346   UROBILINOGEN 0.2 07/12/2015 0931   NITRITE NEGATIVE 03/15/2017 1346   LEUKOCYTESUR NEGATIVE 03/15/2017 1346   Sepsis Labs: @LABRCNTIP (procalcitonin:4,lacticidven:4)  ) Recent Results (from the past 240 hour(s))  Culture, blood (routine x 2)     Status: None (Preliminary result)   Collection Time: 03/15/17   8:18 PM  Result Value Ref Range Status   Specimen Description RIGHT ANTECUBITAL  Final   Special Requests Blood Culture adequate volume  Final   Culture NO GROWTH 3 DAYS  Final   Report Status PENDING  Incomplete  Culture, blood (routine x 2)     Status: None (Preliminary result)   Collection Time: 03/15/17 10:30 PM  Result Value Ref Range  Status   Specimen Description SITE NOT SPECIFIED  Final   Special Requests   Final    Blood Culture results may not be optimal due to an inadequate volume of blood received in culture bottles   Culture NO GROWTH 3 DAYS  Final   Report Status PENDING  Incomplete  MRSA PCR Screening     Status: None   Collection Time: 03/16/17  4:20 AM  Result Value Ref Range Status   MRSA by PCR NEGATIVE NEGATIVE Final    Comment:        The GeneXpert MRSA Assay (FDA approved for NASAL specimens only), is one component of a comprehensive MRSA colonization surveillance program. It is not intended to diagnose MRSA infection nor to guide or monitor treatment for MRSA infections.          Radiology Studies: Dg Chest Port 1 View  Result Date: 03/17/2017 CLINICAL DATA:  Shortness of Breath EXAM: PORTABLE CHEST 1 VIEW COMPARISON:  March 15, 2017 chest radiograph and chest CT FINDINGS: There is mild generalized interstitial edema. No airspace consolidation. Heart is upper normal in size with pulmonary venous hypertension. No adenopathy. No bone lesions. IMPRESSION: There is mild generalized interstitial edema with changes of pulmonary vascular congestion. There may be a degree of congestive heart failure. No airspace consolidation. Electronically Signed   By: Bretta BangWilliam  Woodruff III M.D.   On: 03/17/2017 09:03        Scheduled Meds: . buPROPion  300 mg Oral QHS  . enoxaparin (LOVENOX) injection  40 mg Subcutaneous Q24H  . furosemide  40 mg Intravenous Once  . gabapentin  600 mg Oral TID  . lamoTRIgine  200 mg Oral QHS  . levothyroxine  137 mcg Oral QAC  breakfast  . methylPREDNISolone (SOLU-MEDROL) injection  40 mg Intravenous Q24H   Continuous Infusions: . sodium chloride 100 mL/hr at 03/18/17 0637  . ceFEPime (MAXIPIME) IV 1 g (03/18/17 0547)  . levofloxacin (LEVAQUIN) IV 750 mg (03/18/17 0947)     LOS: 3 days    Time spent: 25 minutes. Greater than 50% of this time was spent in direct contact with the patient coordinating care.     Chaya JanHERNANDEZ ACOSTA,ESTELA, MD [facility] Pager [phone]  If 7PM-7AM, please contact night-coverage [email or web address] Password [email or web address] 03/18/2017, 10:24 AM

## 2017-03-19 DIAGNOSIS — F319 Bipolar disorder, unspecified: Secondary | ICD-10-CM | POA: Diagnosis not present

## 2017-03-19 DIAGNOSIS — J9601 Acute respiratory failure with hypoxia: Secondary | ICD-10-CM | POA: Diagnosis not present

## 2017-03-19 DIAGNOSIS — J849 Interstitial pulmonary disease, unspecified: Principal | ICD-10-CM

## 2017-03-19 DIAGNOSIS — R0902 Hypoxemia: Secondary | ICD-10-CM

## 2017-03-19 DIAGNOSIS — E039 Hypothyroidism, unspecified: Secondary | ICD-10-CM | POA: Diagnosis not present

## 2017-03-19 DIAGNOSIS — I4581 Long QT syndrome: Secondary | ICD-10-CM | POA: Diagnosis not present

## 2017-03-19 DIAGNOSIS — J811 Chronic pulmonary edema: Secondary | ICD-10-CM | POA: Diagnosis not present

## 2017-03-19 DIAGNOSIS — F4310 Post-traumatic stress disorder, unspecified: Secondary | ICD-10-CM | POA: Diagnosis not present

## 2017-03-19 DIAGNOSIS — R071 Chest pain on breathing: Secondary | ICD-10-CM

## 2017-03-19 DIAGNOSIS — M3500 Sicca syndrome, unspecified: Secondary | ICD-10-CM | POA: Diagnosis not present

## 2017-03-19 DIAGNOSIS — J45901 Unspecified asthma with (acute) exacerbation: Secondary | ICD-10-CM | POA: Diagnosis not present

## 2017-03-19 LAB — CBC
HCT: 37.5 % (ref 36.0–46.0)
HEMOGLOBIN: 12.5 g/dL (ref 12.0–15.0)
MCH: 29.4 pg (ref 26.0–34.0)
MCHC: 33.3 g/dL (ref 30.0–36.0)
MCV: 88.2 fL (ref 78.0–100.0)
PLATELETS: 267 10*3/uL (ref 150–400)
RBC: 4.25 MIL/uL (ref 3.87–5.11)
RDW: 13.3 % (ref 11.5–15.5)
WBC: 12.7 10*3/uL — ABNORMAL HIGH (ref 4.0–10.5)

## 2017-03-19 LAB — BASIC METABOLIC PANEL
ANION GAP: 8 (ref 5–15)
Anion gap: 9 (ref 5–15)
BUN: 11 mg/dL (ref 6–20)
BUN: 13 mg/dL (ref 6–20)
CALCIUM: 8.5 mg/dL — AB (ref 8.9–10.3)
CHLORIDE: 102 mmol/L (ref 101–111)
CO2: 28 mmol/L (ref 22–32)
CO2: 25 mmol/L (ref 22–32)
Calcium: 9 mg/dL (ref 8.9–10.3)
Chloride: 104 mmol/L (ref 101–111)
Creatinine, Ser: 0.8 mg/dL (ref 0.44–1.00)
Creatinine, Ser: 0.79 mg/dL (ref 0.44–1.00)
Glucose, Bld: 127 mg/dL — ABNORMAL HIGH (ref 65–99)
Glucose, Bld: 159 mg/dL — ABNORMAL HIGH (ref 65–99)
POTASSIUM: 3.7 mmol/L (ref 3.5–5.1)
Potassium: 2.6 mmol/L — CL (ref 3.5–5.1)
SODIUM: 136 mmol/L (ref 135–145)
Sodium: 140 mmol/L (ref 135–145)

## 2017-03-19 LAB — MAGNESIUM: MAGNESIUM: 2 mg/dL (ref 1.7–2.4)

## 2017-03-19 MED ORDER — POTASSIUM CHLORIDE CRYS ER 20 MEQ PO TBCR
40.0000 meq | EXTENDED_RELEASE_TABLET | Freq: Every day | ORAL | Status: DC
  Administered 2017-03-19 – 2017-03-20 (×2): 40 meq via ORAL
  Filled 2017-03-19 (×2): qty 2

## 2017-03-19 MED ORDER — FUROSEMIDE 10 MG/ML IJ SOLN
40.0000 mg | Freq: Once | INTRAMUSCULAR | Status: AC
  Administered 2017-03-19: 40 mg via INTRAVENOUS
  Filled 2017-03-19: qty 4

## 2017-03-19 MED ORDER — CYCLOBENZAPRINE HCL 5 MG PO TABS
7.5000 mg | ORAL_TABLET | Freq: Three times a day (TID) | ORAL | Status: DC | PRN
  Administered 2017-03-19 – 2017-03-20 (×3): 7.5 mg via ORAL
  Filled 2017-03-19 (×4): qty 1.5

## 2017-03-19 MED ORDER — PREDNISONE 20 MG PO TABS
40.0000 mg | ORAL_TABLET | Freq: Every day | ORAL | Status: DC
  Administered 2017-03-19 – 2017-03-20 (×2): 40 mg via ORAL
  Filled 2017-03-19 (×2): qty 2

## 2017-03-19 MED ORDER — POTASSIUM CHLORIDE 10 MEQ/100ML IV SOLN
10.0000 meq | INTRAVENOUS | Status: AC
Start: 2017-03-19 — End: 2017-03-19
  Administered 2017-03-19 (×4): 10 meq via INTRAVENOUS
  Filled 2017-03-19 (×4): qty 100

## 2017-03-19 MED ORDER — LEVOFLOXACIN 750 MG PO TABS
750.0000 mg | ORAL_TABLET | Freq: Every day | ORAL | Status: DC
  Administered 2017-03-19 – 2017-03-20 (×2): 750 mg via ORAL
  Filled 2017-03-19 (×2): qty 1

## 2017-03-19 NOTE — Progress Notes (Signed)
Subjective: She says she feels substantially better. She has no new complaints. Her breathing is better. She feels weak. Her potassium level this morning is low and is being replaced.  Objective: Vital signs in last 24 hours: Temp:  [97.4 F (36.3 C)-97.9 F (36.6 C)] 97.9 F (36.6 C) (06/06 0645) Pulse Rate:  [57-71] 71 (06/06 0645) Resp:  [16-20] 20 (06/06 0645) BP: (102-135)/(61-97) 102/61 (06/06 0645) SpO2:  [93 %-97 %] 97 % (06/06 0645) Weight change:  Last BM Date: 03/14/17  Intake/Output from previous day: 06/05 0701 - 06/06 0700 In: 4315 [P.O.:480; I.V.:3585; IV Piggyback:250] Out: 3400 [Urine:3400]  PHYSICAL EXAM General appearance: alert, cooperative and mild distress Resp: clear to auscultation bilaterally Cardio: regular rate and rhythm, S1, S2 normal, no murmur, click, rub or gallop GI: soft, non-tender; bowel sounds normal; no masses,  no organomegaly Extremities: extremities normal, atraumatic, no cyanosis or edema skin warm and dry  Lab Results:  Results for orders placed or performed during the hospital encounter of 03/15/17 (from the past 48 hour(s))  Basic metabolic panel     Status: Abnormal   Collection Time: 03/19/17  5:45 AM  Result Value Ref Range   Sodium 140 135 - 145 mmol/L   Potassium 2.6 (LL) 3.5 - 5.1 mmol/L    Comment: CRITICAL RESULT CALLED TO, READ BACK BY AND VERIFIED WITH: GLENN T. AT 0709A ON [id number] BY THOMPSON S.    Chloride 104 101 - 111 mmol/L   CO2 28 22 - 32 mmol/L   Glucose, Bld 127 (H) 65 - 99 mg/dL   BUN 11 6 - 20 mg/dL   Creatinine, Ser 0.80 0.44 - 1.00 mg/dL   Calcium 8.5 (L) 8.9 - 10.3 mg/dL   GFR calc non Af Amer >60 >60 mL/min   GFR calc Af Amer >60 >60 mL/min    Comment: (NOTE) The eGFR has been calculated using the CKD EPI equation. This calculation has not been validated in all clinical situations. eGFR's persistently <60 mL/min signify possible Chronic Kidney Disease.    Anion gap 8 5 - 15  CBC     Status:  Abnormal   Collection Time: 03/19/17  5:45 AM  Result Value Ref Range   WBC 12.7 (H) 4.0 - 10.5 K/uL   RBC 4.25 3.87 - 5.11 MIL/uL   Hemoglobin 12.5 12.0 - 15.0 g/dL   HCT 37.5 36.0 - 46.0 %   MCV 88.2 78.0 - 100.0 fL   MCH 29.4 26.0 - 34.0 pg   MCHC 33.3 30.0 - 36.0 g/dL   RDW 13.3 11.5 - 15.5 %   Platelets 267 150 - 400 K/uL  Magnesium     Status: None   Collection Time: 03/19/17  5:45 AM  Result Value Ref Range   Magnesium 2.0 1.7 - 2.4 mg/dL    ABGS  Recent Labs  03/17/17 0806  PHART 7.496*  PO2ART 54.1*  HCO3 24.5   CULTURES Recent Results (from the past 240 hour(s))  Culture, blood (routine x 2)     Status: None (Preliminary result)   Collection Time: 03/15/17  8:18 PM  Result Value Ref Range Status   Specimen Description RIGHT ANTECUBITAL  Final   Special Requests Blood Culture adequate volume  Final   Culture NO GROWTH 4 DAYS  Final   Report Status PENDING  Incomplete  Culture, blood (routine x 2)     Status: None (Preliminary result)   Collection Time: 03/15/17 10:30 PM  Result Value Ref Range Status     Specimen Description SITE NOT SPECIFIED  Final   Special Requests   Final    Blood Culture results may not be optimal due to an inadequate volume of blood received in culture bottles   Culture NO GROWTH 4 DAYS  Final   Report Status PENDING  Incomplete  MRSA PCR Screening     Status: None   Collection Time: 03/16/17  4:20 AM  Result Value Ref Range Status   MRSA by PCR NEGATIVE NEGATIVE Final    Comment:        The GeneXpert MRSA Assay (FDA approved for NASAL specimens only), is one component of a comprehensive MRSA colonization surveillance program. It is not intended to diagnose MRSA infection nor to guide or monitor treatment for MRSA infections.    Studies/Results: No results found.  Medications:  Prior to Admission:  Prescriptions Prior to Admission  Medication Sig Dispense Refill Last Dose  . buPROPion (WELLBUTRIN XL) 300 MG 24 hr tablet  Take 300 mg by mouth at bedtime.   03/14/2017 at Unknown time  . clonazePAM (KLONOPIN) 0.5 MG tablet Take 0.5 mg by mouth at bedtime as needed for anxiety (and/or sleep).    03/14/2017 at Unknown time  . cyclobenzaprine (FLEXERIL) 10 MG tablet Take 10 mg by mouth 3 (three) times daily as needed for muscle spasms.   Past Month at Unknown time  . gabapentin (NEURONTIN) 600 MG tablet Take 600 mg by mouth 3 (three) times daily as needed (for pain).    03/14/2017 at Unknown time  . hydroxychloroquine (PLAQUENIL) 200 MG tablet Take 400 mg by mouth at bedtime.   Past Week at Unknown time  . lamoTRIgine (LAMICTAL) 200 MG tablet Take 300 mg by mouth at bedtime.    03/14/2017 at Unknown time  . levothyroxine (SYNTHROID, LEVOTHROID) 137 MCG tablet Take 137 mcg by mouth at bedtime.    03/14/2017 at Unknown time  . meclizine (ANTIVERT) 25 MG tablet Take 25 mg by mouth 3 (three) times daily as needed for dizziness.   Past Week at Unknown time  . ondansetron (ZOFRAN) 4 MG tablet Take 4 mg by mouth every 8 (eight) hours as needed for nausea or vomiting.   Past Week at Unknown time  . traMADol (ULTRAM) 50 MG tablet Take 50-100 mg by mouth every 6 (six) hours as needed for moderate pain.    03/14/2017 at 1845  . traZODone (DESYREL) 100 MG tablet Take 100 mg by mouth at bedtime.   03/14/2017 at Unknown time   Scheduled: . buPROPion  300 mg Oral QHS  . enoxaparin (LOVENOX) injection  40 mg Subcutaneous Q24H  . furosemide  40 mg Intravenous Once  . gabapentin  600 mg Oral TID  . lamoTRIgine  200 mg Oral QHS  . levofloxacin  750 mg Oral Daily  . levothyroxine  137 mcg Oral QAC breakfast  . potassium chloride  40 mEq Oral Daily  . predniSONE  40 mg Oral QAC breakfast   Continuous: . potassium chloride 10 mEq (03/19/17 0857)   PRN:acetaminophen **OR** acetaminophen, clonazePAM, cyclobenzaprine, HYDROmorphone (DILAUDID) injection, iopamidol, ipratropium-albuterol, morphine injection, promethazine, zolpidem  Assesment: She was  admitted with interstitial pneumonia. She had some volume overload and that's better. Agree with transition to oral meds. Replace her potassium which was low. Agree that she will need steroid taper when she goes home. She'll need oral Levaquin for about 10 more days I think. She'll need to follow-up with her primary and in my office. Agree if she's doing   this well she can go home tomorrow Active Problems:   Irritable bowel syndrome   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[place])   Sjogren's syndrome with lung involvement ([place])    Plan: As above    LOS: 4 days   Kimeka Badour L 03/19/2017, 9:13 AM

## 2017-03-19 NOTE — Progress Notes (Signed)
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  She had volume overload, and ECHO showed EF of 60%.     Assessment & Plan:   Active Problems:   Irritable bowel syndrome   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HCC)   Sjogren's syndrome with lung involvement (HCC)   1. Instertitial PNA:  Will change to oral prednisone, oral Levoquin, and wean oxygen.  If well, and OK with Dr Juanetta Gosling, will plan to d/c home tomorrow. 2. Sjogren's:  She has been on Plaquenil.  Will likely give tapering steroid upon discharge.    DVT prophylaxis: Lovenox.  Code Status: FULL CODE.  Family Communication: Mother at bedside.  Disposition Plan: home.   Consultants:   Pulmonologist.   Procedures:   None.   Antimicrobials: Anti-infectives    Start     Dose/Rate Route Frequency Ordered Stop   03/19/17 0900  levofloxacin (LEVAQUIN) tablet 750 mg     750 mg Oral Daily 03/19/17 0850     03/16/17 1000  levofloxacin (LEVAQUIN) IVPB 750 mg  Status:  Discontinued     750 mg 100 mL/hr over 90 Minutes Intravenous Every 24 hours 03/16/17 0939 03/19/17 0850   03/16/17 0800  vancomycin (VANCOCIN) IVPB 1000 mg/200 mL premix  Status:  Discontinued     1,000 mg 200 mL/hr over 60 Minutes Intravenous Every 12 hours 03/15/17 2214 03/16/17 1430   03/16/17 0600  ceFEPIme (MAXIPIME) 1 g in dextrose 5 % 50 mL IVPB  Status:  Discontinued     1 g 100 mL/hr over 30 Minutes Intravenous Every 8 hours 03/15/17 2214 03/19/17 0850   03/15/17 1930  vancomycin (VANCOCIN) 1,500 mg in sodium chloride 0.9 % 500 mL IVPB  Status:  Discontinued     1,500 mg 250 mL/hr over 120 Minutes Intravenous  Once 03/15/17 1910 03/17/17 1101   03/15/17 1845  ceFEPIme (MAXIPIME) 1 g in dextrose 5 % 50 mL IVPB     1 g 100 mL/hr  over 30 Minutes Intravenous  Once 03/15/17 1832 03/15/17 1915       Subjective:   Feeling a little better.   Objective: Vitals:   03/18/17 0500 03/18/17 1337 03/18/17 2343 03/19/17 0645  BP: (!) 134/56 (!) 112/97 135/75 102/61  Pulse: (!) 50 60 (!) 57 71  Resp: 18 18 16 20   Temp: 98.2 F (36.8 C) 97.6 F (36.4 C) 97.4 F (36.3 C) 97.9 F (36.6 C)  TempSrc: Oral Oral Oral   SpO2: 96% 93% 97% 97%  Weight:      Height:        Intake/Output Summary (Last 24 hours) at 03/19/17 0852 Last data filed at 03/19/17 0532  Gross per 24 hour  Intake             4315 ml  Output             3400 ml  Net              915 ml   Filed Weights   03/15/17 1334  Weight: 83.5 kg (184 lb)    Examination:  General exam: Appears calm and comfortable  Respiratory system: No wheezing, bilateral rales heard.  Cardiovascular system: S1 & S2 heard, RRR. No JVD, murmurs, rubs, gallops or clicks. No pedal  edema. Gastrointestinal system: Abdomen is nondistended, soft and nontender. No organomegaly or masses felt. Normal bowel sounds heard. Central nervous system: Alert and oriented. No focal neurological deficits. Extremities: Symmetric 5 x 5 power. Skin: No rashes, lesions or ulcers Psychiatry: Judgement and insight appear normal. Mood & affect appropriate.   Data Reviewed: I have personally reviewed following labs and imaging studies  CBC:  Recent Labs Lab 03/15/17 1357 03/15/17 2230 03/16/17 0421 03/19/17 0545  WBC 13.3* 13.2* 13.3* 12.7*  NEUTROABS 10.8*  --   --   --   HGB 12.9 11.9* 11.4* 12.5  HCT 39.3 36.4 34.5* 37.5  MCV 89.3 90.5 88.9 88.2  PLT 218 172 216 267   Basic Metabolic Panel:  Recent Labs Lab 03/15/17 1357 03/15/17 2030 03/15/17 2230 03/16/17 0421 03/19/17 0545  NA 135  --   --  140 140  K 3.3*  --   --  3.4* 2.6*  CL 102  --   --  112* 104  CO2 25  --   --  22 28  GLUCOSE 99  --   --  172* 127*  BUN 8  --   --  6 11  CREATININE 1.13*  --  1.11* 0.80  0.80  CALCIUM 9.1  --   --  8.4* 8.5*  MG  --  1.9  --   --  2.0   GFR: Estimated Creatinine Clearance: 95.6 mL/min (by C-G formula based on SCr of 0.8 mg/dL). Liver Function Tests:  Recent Labs Lab 03/15/17 1357 03/16/17 0421  AST 31 22  ALT 33 26  ALKPHOS 71 62  BILITOT 0.9 0.3  PROT 7.3 6.4*  ALBUMIN 4.3 3.6   Cardiac Enzymes:  Recent Labs Lab 03/15/17 1357 03/15/17 1725  TROPONINI <0.03 <0.03   Sepsis Labs:  Recent Labs Lab 03/15/17 1357 03/15/17 2030 03/16/17 0111 03/16/17 0751  LATICACIDVEN 1.6 8.7* 5.0* 1.0    Recent Results (from the past 240 hour(s))  Culture, blood (routine x 2)     Status: None (Preliminary result)   Collection Time: 03/15/17  8:18 PM  Result Value Ref Range Status   Specimen Description RIGHT ANTECUBITAL  Final   Special Requests Blood Culture adequate volume  Final   Culture NO GROWTH 4 DAYS  Final   Report Status PENDING  Incomplete  Culture, blood (routine x 2)     Status: None (Preliminary result)   Collection Time: 03/15/17 10:30 PM  Result Value Ref Range Status   Specimen Description SITE NOT SPECIFIED  Final   Special Requests   Final    Blood Culture results may not be optimal due to an inadequate volume of blood received in culture bottles   Culture NO GROWTH 4 DAYS  Final   Report Status PENDING  Incomplete  MRSA PCR Screening     Status: None   Collection Time: 03/16/17  4:20 AM  Result Value Ref Range Status   MRSA by PCR NEGATIVE NEGATIVE Final    Comment:        The GeneXpert MRSA Assay (FDA approved for NASAL specimens only), is one component of a comprehensive MRSA colonization surveillance program. It is not intended to diagnose MRSA infection nor to guide or monitor treatment for MRSA infections.      Radiology Studies: Dg Chest Port 1 View  Result Date: 03/17/2017 CLINICAL DATA:  Shortness of Breath EXAM: PORTABLE CHEST 1 VIEW COMPARISON:  March 15, 2017 chest radiograph and chest CT FINDINGS:  There  is mild generalized interstitial edema. No airspace consolidation. Heart is upper normal in size with pulmonary venous hypertension. No adenopathy. No bone lesions. IMPRESSION: There is mild generalized interstitial edema with changes of pulmonary vascular congestion. There may be a degree of congestive heart failure. No airspace consolidation. Electronically Signed   By: Bretta BangWilliam  Woodruff III M.D.   On: 03/17/2017 09:03    Scheduled Meds: . buPROPion  300 mg Oral QHS  . enoxaparin (LOVENOX) injection  40 mg Subcutaneous Q24H  . furosemide  40 mg Intravenous Once  . gabapentin  600 mg Oral TID  . lamoTRIgine  200 mg Oral QHS  . levofloxacin  750 mg Oral Daily  . levothyroxine  137 mcg Oral QAC breakfast  . potassium chloride  40 mEq Oral Daily  . predniSONE  40 mg Oral QAC breakfast   Continuous Infusions: . potassium chloride 10 mEq (03/19/17 0747)     LOS: 4 days   [place] , MD FACP Hospitalist.   If 7PM-7AM, please contact night-coverage [email or web address] Password [email or web address] 03/19/2017, 8:52 AM

## 2017-03-19 NOTE — Progress Notes (Signed)
CRITICAL VALUE ALERT  Critical Value: Potassium 2.6  Date & Time Notied: 03/19/2017 at 0710  Provider Notified:Dr. Conley RollsLe  Orders Received/Actions taken: MD ordered Potassium runs and telemetry monitoring, on coming RN notified.

## 2017-03-20 LAB — BASIC METABOLIC PANEL
Anion gap: 7 (ref 5–15)
BUN: 15 mg/dL (ref 6–20)
CHLORIDE: 103 mmol/L (ref 101–111)
CO2: 28 mmol/L (ref 22–32)
CREATININE: 0.85 mg/dL (ref 0.44–1.00)
Calcium: 8.6 mg/dL — ABNORMAL LOW (ref 8.9–10.3)
GFR calc Af Amer: >60 mL/min (ref >60)
GFR calc non Af Amer: >60 mL/min (ref >60)
GLUCOSE: 174 mg/dL — AB (ref 65–99)
POTASSIUM: 2.9 mmol/L — AB (ref 3.5–5.1)
SODIUM: 138 mmol/L (ref 135–145)

## 2017-03-20 LAB — POTASSIUM: POTASSIUM: 4.3 mmol/L (ref 3.5–5.1)

## 2017-03-20 LAB — CULTURE, BLOOD (ROUTINE X 2)
CULTURE: NO GROWTH
Culture: NO GROWTH
SPECIAL REQUESTS: ADEQUATE

## 2017-03-20 MED ORDER — POTASSIUM CHLORIDE CRYS ER 20 MEQ PO TBCR: 40.0000 meq | EXTENDED_RELEASE_TABLET | Freq: Every day | ORAL | 0 refills | Status: DC

## 2017-03-20 MED ORDER — POTASSIUM CHLORIDE 10 MEQ/100ML IV SOLN
10.0000 meq | INTRAVENOUS | Status: AC
  Administered 2017-03-20 (×3): 10 meq via INTRAVENOUS
  Filled 2017-03-20 (×3): qty 100

## 2017-03-20 MED ORDER — POTASSIUM CHLORIDE 10 MEQ/100ML IV SOLN
10.0000 meq | Freq: Once | INTRAVENOUS | Status: AC
  Administered 2017-03-20: 10 meq via INTRAVENOUS
  Filled 2017-03-20: qty 100

## 2017-03-20 MED ORDER — PREDNISONE 20 MG PO TABS: 40.0000 mg | ORAL_TABLET | Freq: Two times a day (BID) | ORAL | 0 refills | Status: DC

## 2017-03-20 MED ORDER — LEVOFLOXACIN 750 MG PO TABS: 750.0000 mg | ORAL_TABLET | Freq: Every day | ORAL | 0 refills | Status: DC

## 2017-03-20 MED ORDER — CYCLOBENZAPRINE HCL 7.5 MG PO TABS: 7.5000 mg | ORAL_TABLET | Freq: Three times a day (TID) | ORAL | 0 refills | Status: DC | PRN

## 2017-03-20 NOTE — Discharge Summary (Signed)
Physician Discharge Summary  Marisa Johnson ZOX:[id number] DOB: December 02, 1975 DOA: 03/15/2017  PCP: Benita Stabile, MD  Admit date: 03/15/2017 Discharge date: 03/20/2017  Admitted From: Home.  Disposition:  Home.   Recommendations for Outpatient Follow-up:  1. Follow up with PCP in 1-2 weeks 2. Follow up with Dr Juanetta Gosling in 1-2 weeks.  3. Check Basic metabolic profile early next week.   Home Health: None.  Equipment/Devices: None.  Discharge Condition: improved.  CODE STATUS: FULL CODE.  Diet recommendation: cardiac diet.   Brief/Interim Summary:  Patient was admitted for SOB by Dr Sharl Ma on March 15, 2017.  As per his H and P:  " Marisa Johnson  is a 41 y.o. 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 Patient was recently diagnosed with Sjogren syndrome, and has been followed by rheumatologist. She denies fever, no nausea vomiting or diarrhea. No dysuria. She complains of dry cough with no phlegm. Complains of chest pain from coughing  In the ED, patient was hypoxic on room air with O2 sats in the 80s. Required 2 L of oxygen via nasal cannula and maintaining saturation in 90s. Chest x-ray showed no acute disease. CTA chest showed new bilateral diffuse interstitial infiltrates could be due to pulmonary edema or interstitial pneumonitis.  Patient was given Solu-Medrol 125 mg IV 1, vancomycin and cefepime per pharmacy consultation for presumed HCAP.  HOSPITAL COURSE:  Patient was admitted and was felt to have instertitial PNA, and she was given supplement oxygen as she was hypoxic, and was started on IV Van and Cefepime, as she is an Psychologist, occupational.  She was also started on IV Steroids.   She was seen in consultation with Dr Juanetta Gosling, and he guided her Tx.  She improved slowly, and had some volume overload, manifested as hypoxia and rales on her exam.  She was given IV Lasix, and her IVF was discontinued.   ECHO was  performed, and she had normal ejection fraction, and unremarkable valvular Fx.  She did have some neck and lower back discomfort, attributed to her bed.  She was given IV dilaudid and flexeril, and it helped.  She improved slowly, and subsequently was able to be switch to oral steroid, and oral Levoquin.  Her Placquenil was held during this time of infection.  It was noted her K was low after lasix given, and she was supplemented.  Her Mag was normal.   She is now stable for discharge.  She will be discharged on another 10 days of Levoquin, K supplements, and Prednisone at 20mg  BID for a week.  No need for tapering. She should follow up with her PCP, Dr Juanetta Gosling, and her rheumatologist as scheduled.  Thank you for allowing me to participate in her care.  Good Day.   Discharge Diagnoses:  Active Problems:   Irritable bowel syndrome   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HCC)   Sjogren's syndrome with lung involvement (HCC)   Discharge Instructions  Allergies as of 03/20/2017      Reactions   Abilify [aripiprazole] Other (See Comments)   Reaction:  Restless leg syndrome    Reglan [metoclopramide] Other (See Comments)   Reaction:  Hallucinations    Dexamethasone Rash      Medication List    STOP taking these medications   hydroxychloroquine 200 MG tablet Commonly known as:  PLAQUENIL     TAKE these medications   buPROPion 300 MG  24 hr tablet Commonly known as:  WELLBUTRIN XL Take 300 mg by mouth at bedtime.   clonazePAM 0.5 MG tablet Commonly known as:  KLONOPIN Take 0.5 mg by mouth at bedtime as needed for anxiety (and/or sleep).   cyclobenzaprine 7.5 MG tablet Commonly known as:  FEXMID Take 1 tablet (7.5 mg total) by mouth 3 (three) times daily as needed for muscle spasms. What changed:  medication strength  how much to take   gabapentin 600 MG tablet Commonly known as:  NEURONTIN Take 600 mg by mouth 3 (three) times daily as needed (for pain).   lamoTRIgine 200 MG tablet Commonly  known as:  LAMICTAL Take 300 mg by mouth at bedtime.   levofloxacin 750 MG tablet Commonly known as:  LEVAQUIN Take 1 tablet (750 mg total) by mouth daily. Start taking on:  03/21/2017   levothyroxine 137 MCG tablet Commonly known as:  SYNTHROID, LEVOTHROID Take 137 mcg by mouth at bedtime.   meclizine 25 MG tablet Commonly known as:  ANTIVERT Take 25 mg by mouth 3 (three) times daily as needed for dizziness.   ondansetron 4 MG tablet Commonly known as:  ZOFRAN Take 4 mg by mouth every 8 (eight) hours as needed for nausea or vomiting.   potassium chloride SA 20 MEQ tablet Commonly known as:  K-DUR,KLOR-CON Take 2 tablets (40 mEq total) by mouth daily. Start taking on:  03/21/2017   predniSONE 20 MG tablet Commonly known as:  DELTASONE Take 2 tablets (40 mg total) by mouth 2 (two) times daily with a meal.   traMADol 50 MG tablet Commonly known as:  ULTRAM Take 50-100 mg by mouth every 6 (six) hours as needed for moderate pain.   traZODone 100 MG tablet Commonly known as:  DESYREL Take 100 mg by mouth at bedtime.       Allergies  Allergen Reactions  . Abilify [Aripiprazole] Other (See Comments)    Reaction:  Restless leg syndrome   . Reglan [Metoclopramide] Other (See Comments)    Reaction:  Hallucinations   . Dexamethasone Rash    Consultations:  Pulmologist.    Procedures/Studies: Dg Chest 2 View  Result Date: 03/15/2017 CLINICAL DATA:  Shortness of breath and cough EXAM: CHEST  2 VIEW COMPARISON:  12/24/2013 FINDINGS: The lungs are clear wiithout focal pneumonia, edema, pneumothorax or pleural effusion. The cardiopericardial silhouette is within normal limits for size. The visualized bony structures of the thorax are intact. Telemetry leads overlie the chest. IMPRESSION: No active cardiopulmonary disease. Electronically Signed   By: Kennith Center M.D.   On: 03/15/2017 14:42   Ct Angio Chest Pe W Or Wo Contrast  Result Date: 03/15/2017 CLINICAL DATA:   Progressive shortness of breath and chest pain for 4 days. EXAM: CT ANGIOGRAPHY CHEST WITH CONTRAST TECHNIQUE: Multidetector CT imaging of the chest was performed using the standard protocol during bolus administration of intravenous contrast. Multiplanar CT image reconstructions and MIPs were obtained to evaluate the vascular anatomy. CONTRAST:  100 mL Isovue 370 COMPARISON:  09/24/2016 FINDINGS: Cardiovascular: Satisfactory opacification of pulmonary arteries noted, and no pulmonary emboli identified. No evidence of thoracic aortic dissection or aneurysm. Mediastinum/Nodes: No masses or pathologically enlarged lymph nodes identified. Shotty less than 1 cm mediastinal and hilar lymph nodes are seen, nonspecific and likely reactive in etiology. Lungs/Pleura: New diffuse interstitial infiltrates are seen, which may be due to edema or interstitial pneumonitis. Mild infiltrate or atelectasis seen in the posterior right lower lobe. Mild dependent atelectasis seen in the  left lung base. No evidence of pleural effusion. Upper abdomen: No acute findings. Musculoskeletal: No suspicious bone lesions identified. Review of the MIP images confirms the above findings. IMPRESSION: No evidence of pulmonary embolism. New bilateral diffuse interstitial infiltrates, which may be due to pulmonary edema or interstitial pneumonitis. Mild infiltrate or atelectasis in posterior right lower lobe. No evidence of pleural effusion. Electronically Signed   By: Myles Rosenthal M.D.   On: 03/15/2017 18:20   Dg Chest Port 1 View  Result Date: 03/17/2017 CLINICAL DATA:  Shortness of Breath EXAM: PORTABLE CHEST 1 VIEW COMPARISON:  March 15, 2017 chest radiograph and chest CT FINDINGS: There is mild generalized interstitial edema. No airspace consolidation. Heart is upper normal in size with pulmonary venous hypertension. No adenopathy. No bone lesions. IMPRESSION: There is mild generalized interstitial edema with changes of pulmonary vascular  congestion. There may be a degree of congestive heart failure. No airspace consolidation. Electronically Signed   By: Bretta Bang III M.D.   On: 03/17/2017 09:03   Dg Knee Complete 4 Views Left  Result Date: 02/27/2017 CLINICAL DATA:  Pt fell from chair at work x April, having pain in lateral aspect of her left knee, also mentioned left shoulder/hip/rib pain as well, pt shielded EXAM: LEFT KNEE - COMPLETE 4+ VIEW COMPARISON:  None. FINDINGS: No evidence of fracture, dislocation, or joint effusion. No evidence of arthropathy or other focal bone abnormality. Soft tissues are unremarkable. Small bone island incidentally noted in the distal femur. IMPRESSION: Negative. Electronically Signed   By: Norva Pavlov M.D.   On: 02/27/2017 11:11   Dg Knee Complete 4 Views Right  Result Date: 02/27/2017 CLINICAL DATA:  Right superior patella pain x Saturday, heard/felt "big pop" in right knee while cleaning at her house, pt shielded EXAM: RIGHT KNEE - COMPLETE 4+ VIEW COMPARISON:  None. FINDINGS: No evidence of fracture, dislocation, or joint effusion. No evidence of arthropathy or other focal bone abnormality. Soft tissues are unremarkable. IMPRESSION: Negative. Electronically Signed   By: Norva Pavlov M.D.   On: 02/27/2017 11:15       Subjective: Feeling tired.    Discharge Exam: Vitals:   03/19/17 2114 03/20/17 0452  BP: 111/60 117/87  Pulse: 71 79  Resp: 18 18  Temp: 98.2 F (36.8 C) 98.7 F (37.1 C)   Vitals:   03/19/17 1554 03/19/17 2114 03/20/17 0452 03/20/17 1201  BP: 119/72 111/60 117/87   Pulse: 64 71 79   Resp: 20 18 18    Temp: 98 F (36.7 C) 98.2 F (36.8 C) 98.7 F (37.1 C)   TempSrc:  Oral Oral   SpO2: 96% 92% 97% 98%  Weight:      Height:        General: Pt is alert, awake, not in acute distress Cardiovascular: RRR, S1/S2 +, no rubs, no gallops Respiratory: CTA bilaterally, no wheezing, no rhonchi Abdominal: Soft, NT, ND, bowel sounds + Extremities: no  edema, no cyanosis    The results of significant diagnostics from this hospitalization (including imaging, microbiology, ancillary and laboratory) are listed below for reference.     Microbiology: Recent Results (from the past 240 hour(s))  Culture, blood (routine x 2)     Status: None   Collection Time: 03/15/17  8:18 PM  Result Value Ref Range Status   Specimen Description RIGHT ANTECUBITAL  Final   Special Requests Blood Culture adequate volume  Final   Culture NO GROWTH 5 DAYS  Final   Report Status 03/20/2017  FINAL  Final  Culture, blood (routine x 2)     Status: None   Collection Time: 03/15/17 10:30 PM  Result Value Ref Range Status   Specimen Description SITE NOT SPECIFIED  Final   Special Requests   Final    Blood Culture results may not be optimal due to an inadequate volume of blood received in culture bottles   Culture NO GROWTH 5 DAYS  Final   Report Status 03/20/2017 FINAL  Final  MRSA PCR Screening     Status: None   Collection Time: 03/16/17  4:20 AM  Result Value Ref Range Status   MRSA by PCR NEGATIVE NEGATIVE Final    Comment:        The GeneXpert MRSA Assay (FDA approved for NASAL specimens only), is one component of a comprehensive MRSA colonization surveillance program. It is not intended to diagnose MRSA infection nor to guide or monitor treatment for MRSA infections.      Labs: BNP (last 3 results)  Recent Labs  03/15/17 1357  BNP 41.0   Basic Metabolic Panel:  Recent Labs Lab 03/15/17 1357 03/15/17 2030 03/15/17 2230 03/16/17 0421 03/19/17 0545 03/19/17 1923 03/20/17 0647 03/20/17 1632  NA 135  --   --  140 140 136 138  --   K 3.3*  --   --  3.4* 2.6* 3.7 2.9* 4.3  CL 102  --   --  112* 104 102 103  --   CO2 25  --   --  22 28 25 28   --   GLUCOSE 99  --   --  172* 127* 159* 174*  --   BUN 8  --   --  6 11 13 15   --   CREATININE 1.13*  --  1.11* 0.80 0.80 0.79 0.85  --   CALCIUM 9.1  --   --  8.4* 8.5* 9.0 8.6*  --   MG  --   1.9  --   --  2.0  --   --   --    Liver Function Tests:  Recent Labs Lab 03/15/17 1357 03/16/17 0421  AST 31 22  ALT 33 26  ALKPHOS 71 62  BILITOT 0.9 0.3  PROT 7.3 6.4*  ALBUMIN 4.3 3.6   CBC:  Recent Labs Lab 03/15/17 1357 03/15/17 2230 03/16/17 0421 03/19/17 0545  WBC 13.3* 13.2* 13.3* 12.7*  NEUTROABS 10.8*  --   --   --   HGB 12.9 11.9* 11.4* 12.5  HCT 39.3 36.4 34.5* 37.5  MCV 89.3 90.5 88.9 88.2  PLT 218 172 216 267   Cardiac Enzymes:  Recent Labs Lab 03/15/17 1357 03/15/17 1725  TROPONINI <0.03 <0.03   Urinalysis    Component Value Date/Time   COLORURINE YELLOW 03/15/2017 1346   APPEARANCEUR CLEAR 03/15/2017 1346   LABSPEC 1.005 03/15/2017 1346   PHURINE 5.0 03/15/2017 1346   GLUCOSEU NEGATIVE 03/15/2017 1346   HGBUR LARGE (A) 03/15/2017 1346   BILIRUBINUR NEGATIVE 03/15/2017 1346   KETONESUR NEGATIVE 03/15/2017 1346   PROTEINUR NEGATIVE 03/15/2017 1346   UROBILINOGEN 0.2 07/12/2015 0931   NITRITE NEGATIVE 03/15/2017 1346   LEUKOCYTESUR NEGATIVE 03/15/2017 1346   Microbiology Recent Results (from the past 240 hour(s))  Culture, blood (routine x 2)     Status: None   Collection Time: 03/15/17  8:18 PM  Result Value Ref Range Status   Specimen Description RIGHT ANTECUBITAL  Final   Special Requests Blood Culture adequate volume  Final  Culture NO GROWTH 5 DAYS  Final   Report Status 03/20/2017 FINAL  Final  Culture, blood (routine x 2)     Status: None   Collection Time: 03/15/17 10:30 PM  Result Value Ref Range Status   Specimen Description SITE NOT SPECIFIED  Final   Special Requests   Final    Blood Culture results may not be optimal due to an inadequate volume of blood received in culture bottles   Culture NO GROWTH 5 DAYS  Final   Report Status 03/20/2017 FINAL  Final  MRSA PCR Screening     Status: None   Collection Time: 03/16/17  4:20 AM  Result Value Ref Range Status   MRSA by PCR NEGATIVE NEGATIVE Final    Comment:         The GeneXpert MRSA Assay (FDA approved for NASAL specimens only), is one component of a comprehensive MRSA colonization surveillance program. It is not intended to diagnose MRSA infection nor to guide or monitor treatment for MRSA infections.     Time coordinating discharge: Over 30 minutes SIGNED:  Houston Siren, MD FACP [facility] 03/20/2017, 5:39 PM   If 7PM-7AM, please contact night-coverage [email or web address] Password [email or web address]

## 2017-03-20 NOTE — Progress Notes (Signed)
Discharge instructions gone over with patient, verbalized understanding. Printed prescription given to patient. IV removed, tolerated procedure well.

## 2017-03-20 NOTE — Progress Notes (Signed)
I made a brief visit this morning. She hopes to go home today. She still has some shortness of breath and still is coughing but her lungs are definitely clearer. From a strictly pulmonary point of view I think she is okay to discharge.

## 2017-03-20 NOTE — Care Management Note (Signed)
Case Management Note  Patient Details  Name: Marisa Johnson MRN: [id number] Date of Birth: 24-Oct-1975   If discussed at Long Length of Stay Meetings, dates discussed:  03/20/2017  Additional Comments:   Brycin Kille, Chrystine OilerSharley Diane, RN 03/20/2017, 10:41 AM

## 2017-03-31 ENCOUNTER — Other Ambulatory Visit (HOSPITAL_COMMUNITY): Payer: Self-pay | Admitting: Pulmonary Disease

## 2017-03-31 DIAGNOSIS — J690 Pneumonitis due to inhalation of food and vomit: Secondary | ICD-10-CM

## 2017-03-31 DIAGNOSIS — M790 Rheumatism, unspecified: Secondary | ICD-10-CM | POA: Diagnosis not present

## 2017-03-31 DIAGNOSIS — E039 Hypothyroidism, unspecified: Secondary | ICD-10-CM | POA: Diagnosis not present

## 2017-04-03 ENCOUNTER — Ambulatory Visit (HOSPITAL_COMMUNITY)
Admission: RE | Admit: 2017-04-03 | Discharge: 2017-04-03 | Disposition: A | Discharge status: Home / Self Care | Payer: 59 | Source: Ambulatory Visit | Attending: Pulmonary Disease | Admitting: Pulmonary Disease

## 2017-04-03 DIAGNOSIS — J690 Pneumonitis due to inhalation of food and vomit: Secondary | ICD-10-CM

## 2017-04-03 DIAGNOSIS — J189 Pneumonia, unspecified organism: Secondary | ICD-10-CM | POA: Diagnosis not present

## 2017-04-03 DIAGNOSIS — R918 Other nonspecific abnormal finding of lung field: Secondary | ICD-10-CM | POA: Diagnosis not present

## 2017-04-03 DIAGNOSIS — J90 Pleural effusion, not elsewhere classified: Secondary | ICD-10-CM | POA: Insufficient documentation

## 2017-04-04 DIAGNOSIS — F5101 Primary insomnia: Secondary | ICD-10-CM | POA: Diagnosis not present

## 2017-04-04 DIAGNOSIS — E039 Hypothyroidism, unspecified: Secondary | ICD-10-CM | POA: Diagnosis not present

## 2017-04-04 DIAGNOSIS — F339 Major depressive disorder, recurrent, unspecified: Secondary | ICD-10-CM | POA: Diagnosis not present

## 2017-04-04 DIAGNOSIS — Z0001 Encounter for general adult medical examination with abnormal findings: Secondary | ICD-10-CM | POA: Diagnosis not present

## 2017-04-04 DIAGNOSIS — J45909 Unspecified asthma, uncomplicated: Secondary | ICD-10-CM | POA: Diagnosis not present

## 2017-04-04 DIAGNOSIS — J189 Pneumonia, unspecified organism: Secondary | ICD-10-CM | POA: Diagnosis not present

## 2017-04-09 ENCOUNTER — Other Ambulatory Visit: Payer: Self-pay | Admitting: *Deleted

## 2017-04-09 ENCOUNTER — Encounter: Payer: Self-pay | Admitting: *Deleted

## 2017-04-09 NOTE — Patient Outreach (Signed)
[facility] Care Management  04/09/2017  Elmore GuiseLori A Johnson Mar 13, 1976 [id number]   Subjective: Telephone call to patient's home number times 3 (called back due to bad phone connection), spoke with patient, and HIPAA verified.  [facility] Transition of care follow up, patient voiced understanding, and is in agreement to follow up.   Patient states she is doing better and has completed follow up appointment with primary MD.   States she is having some financial hardship due to illness and may consider applying for the [facility] other fund.   States she has the hospital indemnity supplemental insurance, has been receiving the run around regarding supporting documentation to file claims, RNCM verbally gave benefit specialist contact [phone]( [phone]), patient voices understanding, and states she will follow up to file claims if appropriate.  States she also has been approved for family medical leave act (FMLA) through [employer].  Patient states she does not have any education material, transition of care, care coordination, disease management, disease monitoring, transportation, community resource, or pharmacy needs at this time. States she is very appreciative of the follow up and is in agreement to receive [facility] information.    Objective: Per chart review, patient hospitalized 03/15/17 - 03/20/17 for Interstitial pneumonia.    Patient also has a history of : Irritable bowel syndrome and  Sjogren's syndrome with lung involvement.      Assessment:  Received [facility] Transition of care referral on 03/18/17 via [employer] report.   Transition of care follow up completed, no care management needs, and will proceed with case closure.     Plan: RNCM will send patient successful outreach letter, [facility] pamphlet, and magnet. RNCM will send case closure due to follow up completed / no care management needs request to Iverson AlaminLaura Greeson at [facility].   Damacio Weisgerber H. Gardiner Barefootooper RN, BSN, CCM [facility] Care [facility] Telephonic CM Phone: [phone] Fax: [phone]

## 2017-04-23 DIAGNOSIS — F3181 Bipolar II disorder: Secondary | ICD-10-CM | POA: Diagnosis not present

## 2017-05-03 ENCOUNTER — Observation Stay (HOSPITAL_COMMUNITY)
Admission: EM | Admit: 2017-05-03 | Discharge: 2017-05-05 | Disposition: A | Discharge status: Home / Self Care | Payer: 59 | Attending: Internal Medicine | Admitting: Internal Medicine

## 2017-05-03 ENCOUNTER — Emergency Department (HOSPITAL_COMMUNITY): Payer: 59

## 2017-05-03 ENCOUNTER — Encounter (HOSPITAL_COMMUNITY): Payer: Self-pay | Admitting: *Deleted

## 2017-05-03 DIAGNOSIS — R0609 Other forms of dyspnea: Secondary | ICD-10-CM | POA: Diagnosis not present

## 2017-05-03 DIAGNOSIS — Z79899 Other long term (current) drug therapy: Secondary | ICD-10-CM | POA: Diagnosis not present

## 2017-05-03 DIAGNOSIS — N921 Excessive and frequent menstruation with irregular cycle: Secondary | ICD-10-CM | POA: Diagnosis not present

## 2017-05-03 DIAGNOSIS — Z8742 Personal history of other diseases of the female genital tract: Secondary | ICD-10-CM | POA: Diagnosis not present

## 2017-05-03 DIAGNOSIS — R0600 Dyspnea, unspecified: Secondary | ICD-10-CM | POA: Diagnosis not present

## 2017-05-03 DIAGNOSIS — E039 Hypothyroidism, unspecified: Secondary | ICD-10-CM | POA: Insufficient documentation

## 2017-05-03 DIAGNOSIS — R0602 Shortness of breath: Secondary | ICD-10-CM | POA: Diagnosis present

## 2017-05-03 DIAGNOSIS — R05 Cough: Secondary | ICD-10-CM | POA: Diagnosis not present

## 2017-05-03 LAB — CBC WITH DIFFERENTIAL/PLATELET
BASOS PCT: 1 %
Basophils Absolute: 0 10*3/uL (ref 0.0–0.1)
EOS ABS: 0.2 10*3/uL (ref 0.0–0.7)
EOS PCT: 3 %
HCT: 38.9 % (ref 36.0–46.0)
Hemoglobin: 13.2 g/dL (ref 12.0–15.0)
LYMPHS ABS: 2.3 10*3/uL (ref 0.7–4.0)
Lymphocytes Relative: 44 %
MCH: 30.5 pg (ref 26.0–34.0)
MCHC: 33.9 g/dL (ref 30.0–36.0)
MCV: 89.8 fL (ref 78.0–100.0)
Monocytes Absolute: 0.5 10*3/uL (ref 0.1–1.0)
Monocytes Relative: 10 %
Neutro Abs: 2.2 10*3/uL (ref 1.7–7.7)
Neutrophils Relative %: 42 %
Platelets: 238 10*3/uL (ref 150–400)
RBC: 4.33 MIL/uL (ref 3.87–5.11)
RDW: 13.8 % (ref 11.5–15.5)
WBC: 5.2 10*3/uL (ref 4.0–10.5)

## 2017-05-03 LAB — COMPREHENSIVE METABOLIC PANEL
ALBUMIN: 4.3 g/dL (ref 3.5–5.0)
ALT: 14 U/L (ref 14–54)
ANION GAP: 9 (ref 5–15)
AST: 15 U/L (ref 15–41)
Alkaline Phosphatase: 52 U/L (ref 38–126)
BUN: 13 mg/dL (ref 6–20)
CHLORIDE: 108 mmol/L (ref 101–111)
CO2: 24 mmol/L (ref 22–32)
Calcium: 9.5 mg/dL (ref 8.9–10.3)
Creatinine, Ser: 0.87 mg/dL (ref 0.44–1.00)
GFR calc non Af Amer: >60 mL/min (ref >60)
Glucose, Bld: 95 mg/dL (ref 65–99)
Potassium: 3.5 mmol/L (ref 3.5–5.1)
SODIUM: 141 mmol/L (ref 135–145)
Total Bilirubin: 0.9 mg/dL (ref 0.3–1.2)
Total Protein: 7.2 g/dL (ref 6.5–8.1)

## 2017-05-03 LAB — TROPONIN I

## 2017-05-03 LAB — C-REACTIVE PROTEIN: CRP: <0.8 mg/dL (ref <1.0)

## 2017-05-03 LAB — I-STAT TROPONIN, ED: TROPONIN I, POC: 0 ng/mL (ref 0.00–0.08)

## 2017-05-03 LAB — D-DIMER, QUANTITATIVE (NOT AT ARMC)

## 2017-05-03 LAB — TSH: TSH: 0.218 u[IU]/mL — ABNORMAL LOW (ref 0.350–4.500)

## 2017-05-03 LAB — I-STAT BETA HCG BLOOD, ED (MC, WL, AP ONLY): I-stat hCG, quantitative: <5 m[IU]/mL (ref <5)

## 2017-05-03 LAB — SEDIMENTATION RATE: Sed Rate: 2 mm/hr (ref 0–22)

## 2017-05-03 LAB — BRAIN NATRIURETIC PEPTIDE: B NATRIURETIC PEPTIDE 5: 16 pg/mL (ref 0.0–100.0)

## 2017-05-03 MED ORDER — IPRATROPIUM-ALBUTEROL 0.5-2.5 (3) MG/3ML IN SOLN
3.0000 mL | Freq: Four times a day (QID) | RESPIRATORY_TRACT | Status: DC
  Administered 2017-05-03 (×2): 3 mL via RESPIRATORY_TRACT
  Filled 2017-05-03 (×2): qty 3

## 2017-05-03 MED ORDER — CLONAZEPAM 0.5 MG PO TABS
0.5000 mg | ORAL_TABLET | Freq: Two times a day (BID) | ORAL | Status: DC
  Administered 2017-05-03 – 2017-05-05 (×4): 0.5 mg via ORAL
  Filled 2017-05-03 (×4): qty 1

## 2017-05-03 MED ORDER — METHYLPREDNISOLONE SODIUM SUCC 125 MG IJ SOLR
60.0000 mg | Freq: Four times a day (QID) | INTRAMUSCULAR | Status: DC
  Administered 2017-05-03 – 2017-05-05 (×7): 60 mg via INTRAVENOUS
  Filled 2017-05-03 (×7): qty 2

## 2017-05-03 MED ORDER — TRAMADOL HCL 50 MG PO TABS
50.0000 mg | ORAL_TABLET | Freq: Four times a day (QID) | ORAL | Status: DC | PRN
  Administered 2017-05-04 (×2): 100 mg via ORAL
  Filled 2017-05-03 (×2): qty 2

## 2017-05-03 MED ORDER — ENOXAPARIN SODIUM 40 MG/0.4ML ~~LOC~~ SOLN
40.0000 mg | SUBCUTANEOUS | Status: DC
  Administered 2017-05-03 – 2017-05-04 (×2): 40 mg via SUBCUTANEOUS
  Filled 2017-05-03 (×2): qty 0.4

## 2017-05-03 MED ORDER — LAMOTRIGINE 100 MG PO TABS
300.0000 mg | ORAL_TABLET | Freq: Every day | ORAL | Status: DC
  Administered 2017-05-03 – 2017-05-04 (×2): 300 mg via ORAL
  Filled 2017-05-03 (×2): qty 3

## 2017-05-03 MED ORDER — ZOLPIDEM TARTRATE 5 MG PO TABS
5.0000 mg | ORAL_TABLET | Freq: Every evening | ORAL | Status: DC | PRN
Start: 2017-05-03 — End: 2017-05-05
  Administered 2017-05-03 – 2017-05-04 (×2): 5 mg via ORAL
  Filled 2017-05-03 (×2): qty 1

## 2017-05-03 MED ORDER — LEVOTHYROXINE SODIUM 75 MCG PO TABS
150.0000 ug | ORAL_TABLET | Freq: Every day | ORAL | Status: DC
  Administered 2017-05-04 – 2017-05-05 (×2): 150 ug via ORAL
  Filled 2017-05-03 (×2): qty 2

## 2017-05-03 MED ORDER — METHYLPREDNISOLONE SODIUM SUCC 125 MG IJ SOLR
125.0000 mg | Freq: Once | INTRAMUSCULAR | Status: AC
  Administered 2017-05-03: 125 mg via INTRAVENOUS
  Filled 2017-05-03: qty 2

## 2017-05-03 MED ORDER — FLUTICASONE FUROATE-VILANTEROL 200-25 MCG/INH IN AEPB
1.0000 | INHALATION_SPRAY | Freq: Every day | RESPIRATORY_TRACT | Status: DC
  Administered 2017-05-04 – 2017-05-05 (×2): 1 via RESPIRATORY_TRACT
  Filled 2017-05-03: qty 28

## 2017-05-03 MED ORDER — TRAZODONE HCL 50 MG PO TABS
100.0000 mg | ORAL_TABLET | Freq: Every day | ORAL | Status: DC
  Administered 2017-05-03 – 2017-05-04 (×2): 100 mg via ORAL
  Filled 2017-05-03 (×2): qty 2

## 2017-05-03 MED ORDER — BUPROPION HCL ER (XL) 300 MG PO TB24
300.0000 mg | ORAL_TABLET | Freq: Every day | ORAL | Status: DC
  Administered 2017-05-03 – 2017-05-04 (×2): 300 mg via ORAL
  Filled 2017-05-03 (×2): qty 1

## 2017-05-03 MED ORDER — GABAPENTIN 300 MG PO CAPS: 600.0000 mg | ORAL_CAPSULE | Freq: Three times a day (TID) | ORAL | Status: DC | PRN

## 2017-05-03 NOTE — ED Notes (Signed)
Hospitalist at bedside 

## 2017-05-03 NOTE — ED Notes (Signed)
Pt ambulated without assistance.  Oxygenation decreased from 100 % to 92 % and respirations increased from 18 to 52.

## 2017-05-03 NOTE — ED Triage Notes (Signed)
Pt comes in with cough for the last couple days with worsening yesterday. She has been using inhalers prescribed by PCP. She states she has CP upon deep inspiration only on right side. Pt has SOB upon exertion. NAD noted.

## 2017-05-03 NOTE — ED Notes (Signed)
Attemped to call report.  

## 2017-05-03 NOTE — H&P (Signed)
[facility] History and Physical  CORDA SHUTT ZOX:[id number] DOB: 1976/04/13 DOA: 05/03/2017  Referring physician:  PCP: Benita Stabile, MD  Specialists:   Chief Complaint: SOB  HPI: Marisa Johnson is a 41 y.o. 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 Patient states that she has progressive dyspnea, dyspnea on exertion with mild activity. Yesterday, she developed non productive cough with right sided chest pain. Denies exertional chest pains. She felt tired and fatigued. Denies PND or orthopnea, no leg edema. She reports chronic bilateral thigh muscle pains, shoulder muscle pains, denies joint pains. No fever, no abdominal pains, no nausea, vomiting diarrhea -ED: patient desat to 91% with ambulation with respiratory rate increased to 50's. Currently, resting comfortable, no distress   Review of Systems: The patient denies anorexia, fever, weight loss,, vision loss, decreased hearing, hoarseness, chest pain, syncope, dyspnea on exertion, peripheral edema, balance deficits, hemoptysis, abdominal pain, melena, hematochezia, severe indigestion/heartburn, hematuria, incontinence, genital sores, muscle weakness, suspicious skin lesions, transient blindness, difficulty walking, depression, unusual weight change, abnormal bleeding, enlarged lymph nodes, angioedema, and breast masses.    Past Medical History:  Diagnosis Date  . Anxiety   . Carpal tunnel syndrome   . Contraceptive management 08/09/2015  . Decreased libido 08/09/2015  . Friable cervix 08/09/2015  . History of abnormal cervical Pap smear 08/09/2015  . IBS (irritable bowel syndrome)   . Irregular intermenstrual bleeding 08/09/2015  . Major depressive disorder   . Migraine   . Pseudobulbar affect   . PTSD (post-traumatic stress disorder)   . Thyroid disease    . Vaginal Pap smear, abnormal    Past Surgical History:  Procedure Laterality Date  . ESOPHAGOGASTRODUODENOSCOPY N/A 11/17/2016   Procedure: ESOPHAGOGASTRODUODENOSCOPY (EGD);  Surgeon: Malissa Hippo, MD;  Location: [facility];  Service: Endoscopy;  Laterality: N/A;  . None     Social History:  reports that she has never smoked. She has never used smokeless tobacco. She reports that she drinks alcohol. She reports that she does not use drugs. Home;  where does patient live--home, ALF, SNF? and with whom if at home? Yes;  Can patient participate in ADLs?  Allergies  Allergen Reactions  . Abilify [Aripiprazole] Other (See Comments)    Reaction:  Restless leg syndrome   . Reglan [Metoclopramide] Other (See Comments)    Reaction:  Hallucinations   . Dexamethasone Rash    Family History  Problem Relation Age of Onset  . Diabetes Mother   . Hypertension Mother   . Hypertension Father   . Stroke Father   . Cancer Maternal Grandmother   . Kidney disease Paternal Grandmother   . Hypertension Paternal Grandmother   . Hyperlipidemia Paternal Grandmother   . Heart disease Paternal Grandmother   . Heart attack Paternal Grandmother   . Diabetes Daughter   . Hypothyroidism Daughter   . Anxiety disorder Sister   . Anxiety disorder Brother   . Anxiety disorder Sister   . Colon cancer Neg Hx     (be sure to complete)  Prior to Admission medications   Medication Sig Start Date End Date Taking? Authorizing Provider  albuterol (PROVENTIL HFA;VENTOLIN HFA) 108 (90 Base) MCG/ACT inhaler Inhale 2 puffs into the lungs every 6 (six) hours as needed for wheezing or shortness of breath.   Yes [provider]  buPROPion (WELLBUTRIN XL) 300 MG 24 hr tablet  Take 300 mg by mouth at bedtime.   Yes [provider]  clonazePAM (KLONOPIN) 0.5 MG tablet Take 0.5 mg by mouth 2 (two) times daily.    Yes [provider]  fluticasone furoate-vilanterol (BREO ELLIPTA) 200-25  MCG/INH AEPB Inhale 1 puff into the lungs daily.   Yes [provider]  gabapentin (NEURONTIN) 600 MG tablet Take 600 mg by mouth 3 (three) times daily as needed (for pain).    Yes [provider]  lamoTRIgine (LAMICTAL) 200 MG tablet Take 300 mg by mouth at bedtime.    Yes [provider]  levothyroxine (SYNTHROID, LEVOTHROID) 150 MCG tablet Take 1 tablet by mouth daily. 04/23/17  Yes [provider]  meclizine (ANTIVERT) 25 MG tablet Take 25 mg by mouth 3 (three) times daily as needed for dizziness.   Yes [provider]  ondansetron (ZOFRAN) 4 MG tablet Take 4 mg by mouth every 8 (eight) hours as needed for nausea or vomiting.   Yes [provider]  traMADol (ULTRAM) 50 MG tablet Take 50-100 mg by mouth every 6 (six) hours as needed for moderate pain.    Yes [provider]  traZODone (DESYREL) 100 MG tablet Take 100 mg by mouth at bedtime.   Yes [provider]  cyclobenzaprine (FEXMID) 7.5 MG tablet Take 1 tablet (7.5 mg total) by mouth 3 (three) times daily as needed for muscle spasms. Patient not taking: Reported on 05/03/2017 03/20/17   Houston SirenLe, Peter, MD  levofloxacin (LEVAQUIN) 750 MG tablet Take 1 tablet (750 mg total) by mouth daily. Patient not taking: Reported on 04/09/2017 03/21/17   Houston SirenLe, Peter, MD  potassium chloride SA (K-DUR,KLOR-CON) 20 MEQ tablet Take 2 tablets (40 mEq total) by mouth daily. Patient not taking: Reported on 04/09/2017 03/21/17   Houston SirenLe, Peter, MD  predniSONE (DELTASONE) 20 MG tablet Take 2 tablets (40 mg total) by mouth 2 (two) times daily with a meal. Patient not taking: Reported on 04/09/2017 03/20/17   Houston SirenLe, Peter, MD   Physical Exam: Vitals:   05/03/17 1300 05/03/17 1330  BP: 114/76   Pulse: 76 76  Resp: (!) 23 (!) 22  Temp:       General:  No distress   Eyes: eom-i  ENT: no oral ulcers   Neck: supple, no JVD  Cardiovascular: s1,s2 rrr  Respiratory: few crackles LL  Abdomen: soft, nt  Skin:  no rash   Musculoskeletal: no leg edema, muscle tenderness   Psychiatric: no hallucinations   Neurologic: CN 2-12 intact. Motor 5/5 bl   Labs on Admission:  Basic Metabolic Panel:  Recent Labs Lab 05/03/17 1041  NA 141  K 3.5  CL 108  CO2 24  GLUCOSE 95  BUN 13  CREATININE 0.87  CALCIUM 9.5   Liver Function Tests:  Recent Labs Lab 05/03/17 1041  AST 15  ALT 14  ALKPHOS 52  BILITOT 0.9  PROT 7.2  ALBUMIN 4.3   No results for input(s): LIPASE, AMYLASE in the last 168 hours. No results for input(s): AMMONIA in the last 168 hours. CBC:  Recent Labs Lab 05/03/17 1041  WBC 5.2  NEUTROABS 2.2  HGB 13.2  HCT 38.9  MCV 89.8  PLT 238   Cardiac Enzymes: No results for input(s): CKTOTAL, CKMB, CKMBINDEX, TROPONINI in the last 168 hours.  BNP (last 3 results)  Recent Labs  03/15/17 1357 05/03/17 1041  BNP 41.0 16.0    ProBNP (last 3 results) No results for input(s): PROBNP in the  last 8760 hours.  CBG: No results for input(s): GLUCAP in the last 168 hours.  Radiological Exams on Admission: Dg Chest 2 View  Result Date: 05/03/2017 CLINICAL DATA:  Cough. Dyspnea on exertion. Right pleuritic chest pain EXAM: CHEST  2 VIEW COMPARISON:  Chest CT 04/03/2017 FINDINGS: Normal heart size and mediastinal contours. No acute infiltrate or edema. No effusion or pneumothorax. No acute osseous findings. EKG leads create artifact over the chest. IMPRESSION: Negative chest. Electronically Signed   By: Marnee Spring M.D.   On: 05/03/2017 11:13    EKG: not done yet  Assessment/Plan Active Problems:   Dyspnea  41 y.o. 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   Shortness of breath, tachypnea, DOE of unclear etiology. Recent similar presentation, thought due to intetirtial pneumonia or pneumonitis and  responded to steroids, and antibiotics. Echo (03/2017): The cavity size was normal. Wall  thickness was  normal. Systolic function was normal. The estimated ejection  fraction was in the range of 60% to 65%. CT chest showed no PE on recent imaging.  -started iv solumedrol again, bronchodilators, check TB gold test.  Obtain sed rate, crp, anca. will ask pulmonology eva if need to obtain PFTs while inpatient +/- bronch.    Hypothyroidism, check tsh.  Anxiety on clonopin.    Pulmonology.  if consultant consulted, please document name and whether formally or informally consulted  Code Status: full (must indicate code status--if unknown or must be presumed, indicate so) Family Communication: d/w patient, her mother  (indicate person spoken with, if applicable, with phone number if by telephone) Disposition Plan: home pend clinical improvement  (indicate anticipated LOS)  Time spent: >45 minutes   Esperanza Sheets [facility] Pager [phone]  If 7PM-7AM, please contact night-coverage [email or web address] Password [email or web address] 05/03/2017, 2:16 PM

## 2017-05-03 NOTE — ED Provider Notes (Signed)
[facility] Provider Note   CSN: [id number] Arrival date & time: 05/03/17  1008     History   Chief Complaint Chief Complaint  Patient presents with  . Cough    HPI Marisa Johnson is a 41 y.o. female.  The history is provided by the patient.  Cough  This is a new problem. The current episode started yesterday. The problem occurs constantly. The problem has not changed since onset.The cough is non-productive. There has been no fever. Associated symptoms include chest pain and shortness of breath. Pertinent negatives include no rhinorrhea and no sore throat. She has tried nothing for the symptoms. The treatment provided no relief. She is not a smoker.      41 year old female Who presents with cough, chest pain, shortness of breath. She has a history of hypothyroidism, depression, anxiety, and migraine headaches. She reports that she was hospitalized in June for double pneumonia, but upon discharge was told that she likely had Sjogren's affecting her lung. States that she may carry a diagnosis of connective tissue disease, but was never confirmed by her rheumatologist her doctors. Says since her discharge, she has had mild dyspnea with activity, but over the past week has had dyspnea on exertion with daily activities. Appears to be worsening. Yesterday developed cough not productive of sputum, with right-sided chest pain that is worse with deep inspiration. States that she had shortness of breath from laying flat earlier today. No PND, fevers, nausea or vomiting, abdominal pain. No prior history of DVT or OCPs. No calf tenderness or leg swelling.  Past Medical History:  Diagnosis Date  . Anxiety   . Carpal tunnel syndrome   . Contraceptive management 08/09/2015  . Decreased libido 08/09/2015  . Friable cervix 08/09/2015  . History of abnormal cervical Pap smear 08/09/2015  . IBS (irritable bowel syndrome)   . Irregular intermenstrual bleeding 08/09/2015  . Major depressive  disorder   . Migraine   . Pseudobulbar affect   . PTSD (post-traumatic stress disorder)   . Thyroid disease   . Vaginal Pap smear, abnormal     Patient Active Problem List   Diagnosis Date Noted  . 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  . Sjogren's syndrome with lung involvement (HCC) 03/15/2017  . Irregular intermenstrual bleeding 08/09/2015  . Decreased libido 08/09/2015  . Friable cervix 08/09/2015  . Contraceptive management 08/09/2015  . History of abnormal cervical Pap smear 08/09/2015  . Abdominal pain, left lower quadrant 09/21/2013  . Nausea and vomiting 09/20/2013  . Irritable bowel syndrome 05/29/2010  . CLOSED FRACTURE OF HEAD OF RADIUS 04/13/2008  . FX CLOSED FIBULA NOS 04/13/2008    Past Surgical History:  Procedure Laterality Date  . ESOPHAGOGASTRODUODENOSCOPY N/A 11/17/2016   Procedure: ESOPHAGOGASTRODUODENOSCOPY (EGD);  Surgeon: Malissa HippoNajeeb U Rehman, MD;  Location: [facility];  Service: Endoscopy;  Laterality: N/A;  . None      OB History    Gravida Para Term Preterm AB Living   3 3 3     3    SAB TAB Ectopic Multiple Live Births                   Home Medications    Prior to Admission medications   Medication Sig Start Date End Date Taking? Authorizing Provider  albuterol (PROVENTIL HFA;VENTOLIN HFA) 108 (90 Base) MCG/ACT inhaler Inhale 2 puffs into the lungs every 6 (six) hours as needed for wheezing or shortness of breath.   Yes [provider]  buPROPion (WELLBUTRIN XL) 300 MG 24 hr tablet Take 300 mg by mouth at bedtime.   Yes [provider]  clonazePAM (KLONOPIN) 0.5 MG tablet Take 0.5 mg by mouth 2 (two) times daily.    Yes [provider]  fluticasone furoate-vilanterol (BREO ELLIPTA) 200-25 MCG/INH AEPB Inhale 1 puff into the lungs daily.   Yes [provider]  gabapentin (NEURONTIN) 600 MG tablet Take 600 mg by mouth 3 (three) times daily as needed (for pain).    Yes [provider]  lamoTRIgine  (LAMICTAL) 200 MG tablet Take 300 mg by mouth at bedtime.    Yes [provider]  levothyroxine (SYNTHROID, LEVOTHROID) 150 MCG tablet Take 1 tablet by mouth daily. 04/23/17  Yes [provider]  meclizine (ANTIVERT) 25 MG tablet Take 25 mg by mouth 3 (three) times daily as needed for dizziness.   Yes [provider]  ondansetron (ZOFRAN) 4 MG tablet Take 4 mg by mouth every 8 (eight) hours as needed for nausea or vomiting.   Yes [provider]  traMADol (ULTRAM) 50 MG tablet Take 50-100 mg by mouth every 6 (six) hours as needed for moderate pain.    Yes [provider]  traZODone (DESYREL) 100 MG tablet Take 100 mg by mouth at bedtime.   Yes [provider]  cyclobenzaprine (FEXMID) 7.5 MG tablet Take 1 tablet (7.5 mg total) by mouth 3 (three) times daily as needed for muscle spasms. Patient not taking: Reported on 05/03/2017 03/20/17   Houston Siren, MD  levofloxacin (LEVAQUIN) 750 MG tablet Take 1 tablet (750 mg total) by mouth daily. Patient not taking: Reported on 04/09/2017 03/21/17   Houston Siren, MD  potassium chloride SA (K-DUR,KLOR-CON) 20 MEQ tablet Take 2 tablets (40 mEq total) by mouth daily. Patient not taking: Reported on 04/09/2017 03/21/17   Houston Siren, MD  predniSONE (DELTASONE) 20 MG tablet Take 2 tablets (40 mg total) by mouth 2 (two) times daily with a meal. Patient not taking: Reported on 04/09/2017 03/20/17   Houston Siren, MD    Family History Family History  Problem Relation Age of Onset  . Diabetes Mother   . Hypertension Mother   . Hypertension Father   . Stroke Father   . Cancer Maternal Grandmother   . Kidney disease Paternal Grandmother   . Hypertension Paternal Grandmother   . Hyperlipidemia Paternal Grandmother   . Heart disease Paternal Grandmother   . Heart attack Paternal Grandmother   . Diabetes Daughter   . Hypothyroidism Daughter   . Anxiety disorder Sister   . Anxiety disorder Brother   . Anxiety disorder Sister     . Colon cancer Neg Hx     Social History Social History  Substance Use Topics  . Smoking status: Never Smoker  . Smokeless tobacco: Never Used     Comment: Never smoker  . Alcohol use Yes     Comment: occ.     Allergies   Abilify [aripiprazole]; Reglan [metoclopramide]; and Dexamethasone   Review of Systems Review of Systems  HENT: Negative for rhinorrhea and sore throat.   Respiratory: Positive for cough and shortness of breath.   Cardiovascular: Positive for chest pain.  All other systems reviewed and are negative.    Physical Exam Updated Vital Signs BP 114/76   Pulse 76   Temp 97.8 F (36.6 C) (Oral)   Resp (!) 22   Ht 5\' 3"  (1.6 m)   Wt 83.9 kg (185 lb)   LMP  04/17/2017   SpO2 96%   BMI 32.77 kg/m   Physical Exam Physical Exam  Nursing note and vitals reviewed. Constitutional: Well developed, well nourished, non-toxic, and in no acute distress Head: Normocephalic and atraumatic.  Mouth/Throat: Oropharynx is clear and moist.  Neck: Normal range of motion. Neck supple.  Cardiovascular: Normal rate and regular rhythm.   Pulmonary/Chest: Effort normal and breath sounds normal.  Abdominal: Soft. There is no tenderness. There is no rebound and no guarding.  Musculoskeletal: Normal range of motion. no calf tenderness or LE edema Neurological: Alert, no facial droop, fluent speech, moves all extremities symmetrically Skin: Skin is warm and dry.  Psychiatric: Cooperative   ED Treatments / Results  Labs (all labs ordered are listed, but only abnormal results are displayed) Labs Reviewed  CBC WITH DIFFERENTIAL/PLATELET  COMPREHENSIVE METABOLIC PANEL  BRAIN NATRIURETIC PEPTIDE  D-DIMER, QUANTITATIVE (NOT AT [facility])  I-STAT BETA HCG BLOOD, ED ([facility], [facility], [facility] ONLY)  I-STAT TROPONIN, ED    EKG  EKG Interpretation None       Radiology Dg Chest 2 View  Result Date: 05/03/2017 CLINICAL DATA:  Cough. Dyspnea on exertion. Right pleuritic chest pain EXAM:  CHEST  2 VIEW COMPARISON:  Chest CT 04/03/2017 FINDINGS: Normal heart size and mediastinal contours. No acute infiltrate or edema. No effusion or pneumothorax. No acute osseous findings. EKG leads create artifact over the chest. IMPRESSION: Negative chest. Electronically Signed   By: Marnee Spring M.D.   On: 05/03/2017 11:13    Procedures Procedures (including critical care time)  Medications Ordered in ED Medications  methylPREDNISolone sodium succinate (SOLU-MEDROL) 125 mg/2 mL injection 125 mg (125 mg Intravenous Given 05/03/17 1349)     Initial Impression / Assessment and Plan / ED Course  I have reviewed the triage vital signs and the nursing notes.  Pertinent labs & imaging results that were available during my care of the patient were reviewed by me and considered in my medical decision making (see chart for details).    41 year old female with recent hospitalization for interstitial pneumonitis versus pneumonia who presents with progressively worsening dyspnea on exertion and cough. She is nontoxic in no acute distress. No hypoxia or significantly increased work of breathing at rest. Afebrile and hemodynamically stable.  Records reviewed. Patient also had echo on recent admission showing normal systolic and diastolic function. Follow-up high resolution CT scan by Dr. Juanetta Gosling that showed reactive airway disease and showed resolved infiltrates. Patient was treated with both steroids and antibiotics in the hospital with improvement.  Her lungs here are clear to auscultation. She is low risk for PE/DVT but given possible connective tissue disease a d-dimer was sent. D-dimer is negative and she is felt to be ruled out for PE. Chest x-ray visualized and shows no acute cardiopulmonary process. Normal cardiac enzymes and EKG is nonischemic.  With ambulation in the ED, she becomes very tachypneic with respiratory rate of 52-55. Her pulse ox does drop to 91-92% on room air. She states that she  did become very winded, and was near syncopal. Given these drastic findings, she was started on Solu-Medrol given question of reactive airway disease for interstitial pneumonitis. At this time presentation is not concerning for infection. Discussed with Dr. York Spaniel. Will admit for observation.  Final Clinical Impressions(s) / ED Diagnoses   Final diagnoses:  DOE (dyspnea on exertion)    New Prescriptions New Prescriptions   No medications on file     Lavera Guise, MD 05/03/17 1358

## 2017-05-03 NOTE — ED Notes (Signed)
Attempted to call report

## 2017-05-03 NOTE — ED Notes (Signed)
[facility] going [facility].

## 2017-05-03 NOTE — ED Notes (Signed)
ED Provider at bedside. 

## 2017-05-04 ENCOUNTER — Inpatient Hospital Stay (HOSPITAL_COMMUNITY): Payer: 59

## 2017-05-04 DIAGNOSIS — Z79899 Other long term (current) drug therapy: Secondary | ICD-10-CM | POA: Diagnosis not present

## 2017-05-04 DIAGNOSIS — R0602 Shortness of breath: Secondary | ICD-10-CM | POA: Diagnosis not present

## 2017-05-04 DIAGNOSIS — Z8742 Personal history of other diseases of the female genital tract: Secondary | ICD-10-CM | POA: Diagnosis not present

## 2017-05-04 DIAGNOSIS — R0609 Other forms of dyspnea: Secondary | ICD-10-CM | POA: Diagnosis not present

## 2017-05-04 DIAGNOSIS — E039 Hypothyroidism, unspecified: Secondary | ICD-10-CM | POA: Diagnosis not present

## 2017-05-04 DIAGNOSIS — R079 Chest pain, unspecified: Secondary | ICD-10-CM | POA: Diagnosis not present

## 2017-05-04 DIAGNOSIS — R0600 Dyspnea, unspecified: Secondary | ICD-10-CM | POA: Diagnosis not present

## 2017-05-04 DIAGNOSIS — N921 Excessive and frequent menstruation with irregular cycle: Secondary | ICD-10-CM | POA: Diagnosis not present

## 2017-05-04 LAB — TROPONIN I

## 2017-05-04 MED ORDER — IOPAMIDOL (ISOVUE-370) INJECTION 76%
100.0000 mL | Freq: Once | INTRAVENOUS | Status: AC | PRN
  Administered 2017-05-04: 100 mL via INTRAVENOUS

## 2017-05-04 MED ORDER — ONDANSETRON HCL 4 MG/2ML IJ SOLN
4.0000 mg | Freq: Four times a day (QID) | INTRAMUSCULAR | Status: DC | PRN
  Administered 2017-05-04 (×2): 4 mg via INTRAVENOUS
  Filled 2017-05-04 (×2): qty 2

## 2017-05-04 MED ORDER — PROCHLORPERAZINE EDISYLATE 5 MG/ML IJ SOLN
10.0000 mg | Freq: Once | INTRAMUSCULAR | Status: AC
Start: 2017-05-04 — End: 2017-05-04
  Administered 2017-05-04: 10 mg via INTRAVENOUS
  Filled 2017-05-04: qty 2

## 2017-05-04 MED ORDER — IPRATROPIUM-ALBUTEROL 0.5-2.5 (3) MG/3ML IN SOLN
3.0000 mL | Freq: Four times a day (QID) | RESPIRATORY_TRACT | Status: DC
  Administered 2017-05-04: 3 mL via RESPIRATORY_TRACT
  Filled 2017-05-04: qty 3

## 2017-05-04 MED ORDER — IPRATROPIUM-ALBUTEROL 0.5-2.5 (3) MG/3ML IN SOLN: 3.0000 mL | Freq: Four times a day (QID) | RESPIRATORY_TRACT | Status: DC | PRN

## 2017-05-04 MED ORDER — DIPHENHYDRAMINE HCL 50 MG/ML IJ SOLN
25.0000 mg | Freq: Once | INTRAMUSCULAR | Status: AC
  Administered 2017-05-04: 25 mg via INTRAVENOUS
  Filled 2017-05-04: qty 1

## 2017-05-04 MED ORDER — KETOROLAC TROMETHAMINE 15 MG/ML IJ SOLN
15.0000 mg | Freq: Three times a day (TID) | INTRAMUSCULAR | Status: DC | PRN
Start: 2017-05-04 — End: 2017-05-05
  Administered 2017-05-04 (×2): 15 mg via INTRAVENOUS
  Filled 2017-05-04 (×2): qty 1

## 2017-05-04 NOTE — Progress Notes (Signed)
[facility] PROGRESS NOTE  Marisa Johnson ZOX:[id number] DOB: 10/14/1976 DOA: 05/03/2017 PCP: Benita StabileHall, John Z, MD  Brief summary   41 y.o. 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 Patient states that she has progressive dyspnea, dyspnea on exertion with mild activity. Yesterday, she developed non productive cough with right sided chest pain. Denies exertional chest pains. She felt tired and fatigued. Denies PND or orthopnea, no leg edema. She reports chronic bilateral thigh muscle pains, shoulder muscle pains, denies joint pains. No fever, no abdominal pains, no nausea, vomiting diarrhea -ED: patient desat to 91% with ambulation with respiratory rate increased to 50's. Currently, resting comfortable, no distress   Assessment/Plan:   Shortness of breath, tachypnea, DOE of unclear etiology. Recent similar presentation, thought due to intetirtial pneumonia or pneumonitis and responded to steroids, and antibiotics. Echo (03/2017): The cavity size was normal. Wall  thickness wasnormal. Systolic function was normal. The estimated ejectionfraction was in the range of 60% to 65%. CT chest showed no PE on recent imaging.  -started iv solumedrol again, bronchodilators, pend TB gold test, anca.  sed rate, crp: unremarkable. Ordered ct chest per pulmonology eval. Appreciate the input   Hypothyroidism, tsh pend Anxiety, depression on clonopin.  Questionable chronic fatigue syndrome, fibromyalgia.    Code Status: full Family Communication: d/w patient, her family, RN (indicate person spoken with, relationship, and if by phone, the number) Disposition Plan: pend clinical improvement    Consultants:  Pulmonology   Procedures:  none  Antibiotics: Anti-infectives    None        (indicate start date, and stop date if  known)  HPI/Subjective: Alert, no distress but has multiple complaints, leg pains, insomnia, nausea, headaches, anxiety, depression, fatigues, tiredness.   Objective: Vitals:   05/03/17 2120 05/04/17 0456  BP:  132/63  Pulse:  (!) 112  Resp:  20  Temp: 98.4 F (36.9 C) 97.9 F (36.6 C)    Intake/Output Summary (Last 24 hours) at 05/04/17 0955 Last data filed at 05/04/17 0456  Gross per 24 hour  Intake                0 ml  Output              700 ml  Net             -700 ml   Filed Weights   05/03/17 1018 05/03/17 1532  Weight: 83.9 kg (185 lb) 83.4 kg (183 lb 12.8 oz)    Exam:   General:  No distress   Cardiovascular: 1s, s2 rrr  Respiratory: CTA BL  Abdomen: soft, nt  Musculoskeletal: no leg edema    Data Reviewed: Basic Metabolic Panel:  Recent Labs Lab 05/03/17 1041  NA 141  K 3.5  CL 108  CO2 24  GLUCOSE 95  BUN 13  CREATININE 0.87  CALCIUM 9.5   Liver Function Tests:  Recent Labs Lab 05/03/17 1041  AST 15  ALT 14  ALKPHOS 52  BILITOT 0.9  PROT 7.2  ALBUMIN 4.3   No results for input(s): LIPASE, AMYLASE in the last 168 hours. No results for input(s): AMMONIA in the last 168 hours. CBC:  Recent Labs Lab 05/03/17 1041  WBC 5.2  NEUTROABS 2.2  HGB 13.2  HCT 38.9  MCV 89.8  PLT 238   Cardiac Enzymes:  Recent Labs Lab 05/03/17 1625 05/03/17  2140 05/04/17 0429  TROPONINI <0.03 <0.03 <0.03   BNP (last 3 results)  Recent Labs  03/15/17 1357 05/03/17 1041  BNP 41.0 16.0    ProBNP (last 3 results) No results for input(s): PROBNP in the last 8760 hours.  CBG: No results for input(s): GLUCAP in the last 168 hours.  No results found for this or any previous visit (from the past 240 hour(s)).   Studies: Dg Chest 2 View  Result Date: 05/03/2017 CLINICAL DATA:  Cough. Dyspnea on exertion. Right pleuritic chest pain EXAM: CHEST  2 VIEW COMPARISON:  Chest CT 04/03/2017 FINDINGS: Normal heart size and mediastinal  contours. No acute infiltrate or edema. No effusion or pneumothorax. No acute osseous findings. EKG leads create artifact over the chest. IMPRESSION: Negative chest. Electronically Signed   By: Marnee Spring M.D.   On: 05/03/2017 11:13    Scheduled Meds: . buPROPion  300 mg Oral QHS  . clonazePAM  0.5 mg Oral BID  . enoxaparin (LOVENOX) injection  40 mg Subcutaneous Q24H  . fluticasone furoate-vilanterol  1 puff Inhalation Daily  . lamoTRIgine  300 mg Oral QHS  . levothyroxine  150 mcg Oral QAC breakfast  . methylPREDNISolone (SOLU-MEDROL) injection  60 mg Intravenous Q6H  . traZODone  100 mg Oral QHS   Continuous Infusions:  Active Problems:   Dyspnea   SOB (shortness of breath)    Time spent: >35 minutes     Esperanza Sheets  [facility] Pager [phone]. If 7PM-7AM, please contact night-coverage at [email or web address], password [email or web address] 05/04/2017, 9:55 AM  LOS: 1 day

## 2017-05-04 NOTE — Consult Note (Signed)
Consult requested by: [facility] Consult requested for: Respiratory failure  HPI: This is a 41 year old who had been in the hospital with interstitial pneumonia last month. She responded well to antibiotics and steroids and had follow-up chest x-ray that did not show any evidence of infiltrate. However she started having increasing problems with cough and right-sided chest pain shortness of breath and fatigue and she went to the emergency department. When she was seen in the emergency department she initially looked like she was going to be able to be discharged but then she developed desaturation to 91% when she ambulated and her respiratory rate increased to the 50s. She has chronic bilateral thigh and shoulder pain. 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 Chest x-ray that I personally reviewed does not show an infiltrate.  Past Medical History:  Diagnosis Date  . Anxiety   . Carpal tunnel syndrome   . Contraceptive management 08/09/2015  . Decreased libido 08/09/2015  . Friable cervix 08/09/2015  . History of abnormal cervical Pap smear 08/09/2015  . IBS (irritable bowel syndrome)   . Irregular intermenstrual bleeding 08/09/2015  . Major depressive disorder   . Migraine   . Pseudobulbar affect   . PTSD (post-traumatic stress disorder)   . Thyroid disease   . Vaginal Pap smear, abnormal      Family History  Problem Relation Age of Onset  . Diabetes Mother   . Hypertension Mother   . Hypertension Father   . Stroke Father   . Cancer Maternal Grandmother   . Kidney disease Paternal Grandmother   . Hypertension Paternal Grandmother   . Hyperlipidemia Paternal Grandmother   . Heart disease Paternal Grandmother   . Heart attack Paternal Grandmother   . Diabetes Daughter   . Hypothyroidism Daughter   . Anxiety disorder Sister   . Anxiety disorder Brother   . Anxiety disorder Sister   . Colon cancer Neg Hx      Social History   Social History  . Marital  status: Divorced    Spouse name: N/A  . Number of children: N/A  . Years of education: N/A   Occupational History  . 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 Smoking status: Never Smoker  . Smokeless tobacco: Never Used     Comment: Never smoker  . Alcohol use Yes     Comment: occ.  . Drug use: No  . Sexual activity: Yes    Birth control/ protection: OCP, Pill   Other Topics Concern  . None   Social History Narrative  . None     ROS: Except as mentioned is -10 point review of systems    Objective: Vital signs in last 24 hours: Temp:  [97.9 F (36.6 C)-98.4 F (36.9 C)] 97.9 F (36.6 C) (07/22 0456) Pulse Rate:  [67-124] 112 (07/22 0456) Resp:  [19-23] 20 (07/22 0456) BP: (110-132)/(53-76) 132/63 (07/22 0456) SpO2:  [96 %-99 %] 96 % (07/22 0851) Weight:  [83.4 kg (183 lb 12.8 oz)] 83.4 kg (183 lb 12.8 oz) (07/21 1532) Weight change:     Intake/Output from previous day: 07/21 0701 - 07/22 0700 In: -  Out: 700 [Urine:700]  PHYSICAL EXAM Constitutional: She is awake and alert and in no acute distress. Eyes: Pupils react EOMI. Ears nose mouth and throat: Mucous membranes are moist. Hearing is grossly normal. Cardiovascular: Her heart is regular with normal heart sounds. Respiratory: Her respiratory effort is normal. Her lungs are clear. Gastrointestinal:  Her abdomen is soft with no masses. Skin: Warm and dry. Musculoskeletal: Normal strength. Neurologic: No focal abnormalities. Psychiatric: She is mildly anxious  Lab Results: Basic Metabolic Panel:  Recent Labs  16/07/9606/21/18 1041  NA 141  K 3.5  CL 108  CO2 24  GLUCOSE 95  BUN 13  CREATININE 0.87  CALCIUM 9.5   Liver Function Tests:  Recent Labs  05/03/17 1041  AST 15  ALT 14  ALKPHOS 52  BILITOT 0.9  PROT 7.2  ALBUMIN 4.3   No results for input(s): LIPASE, AMYLASE in the last 72 hours. No results for input(s): AMMONIA in the last 72 hours. CBC:  Recent Labs  05/03/17 1041  WBC  5.2  NEUTROABS 2.2  HGB 13.2  HCT 38.9  MCV 89.8  PLT 238   Cardiac Enzymes:  Recent Labs  05/03/17 1625 05/03/17 2140 05/04/17 0429  TROPONINI <0.03 <0.03 <0.03   BNP: No results for input(s): PROBNP in the last 72 hours. D-Dimer:  Recent Labs  05/03/17 1045  DDIMER <0.27   CBG: No results for input(s): GLUCAP in the last 72 hours. Hemoglobin A1C: No results for input(s): HGBA1C in the last 72 hours. Fasting Lipid Panel: No results for input(s): CHOL, HDL, LDLCALC, TRIG, CHOLHDL, LDLDIRECT in the last 72 hours. Thyroid Function Tests:  Recent Labs  05/03/17 1625  TSH 0.218*   Anemia Panel: No results for input(s): VITAMINB12, FOLATE, FERRITIN, TIBC, IRON, RETICCTPCT in the last 72 hours. Coagulation: No results for input(s): LABPROT, INR in the last 72 hours. Urine Drug Screen: Drugs of Abuse  No results found for: LABOPIA, COCAINSCRNUR, LABBENZ, AMPHETMU, THCU, LABBARB  Alcohol Level: No results for input(s): ETH in the last 72 hours. Urinalysis: No results for input(s): COLORURINE, LABSPEC, PHURINE, GLUCOSEU, HGBUR, BILIRUBINUR, KETONESUR, PROTEINUR, UROBILINOGEN, NITRITE, LEUKOCYTESUR in the last 72 hours.  Invalid input(s): APPERANCEUR Misc. Labs:   ABGS: No results for input(s): PHART, PO2ART, TCO2, HCO3 in the last 72 hours.  Invalid input(s): PCO2   MICROBIOLOGY: No results found for this or any previous visit (from the past 240 hour(s)).  Studies/Results: Dg Chest 2 View  Result Date: 05/03/2017 CLINICAL DATA:  Cough. Dyspnea on exertion. Right pleuritic chest pain EXAM: CHEST  2 VIEW COMPARISON:  Chest CT 04/03/2017 FINDINGS: Normal heart size and mediastinal contours. No acute infiltrate or edema. No effusion or pneumothorax. No acute osseous findings. EKG leads create artifact over the chest. IMPRESSION: Negative chest. Electronically Signed   By: Marnee SpringJonathon  Watts M.D.   On: 05/03/2017 11:13    Medications:  Prior to Admission:   Prescriptions Prior to Admission  Medication Sig Dispense Refill Last Dose  . albuterol (PROVENTIL HFA;VENTOLIN HFA) 108 (90 Base) MCG/ACT inhaler Inhale 2 puffs into the lungs every 6 (six) hours as needed for wheezing or shortness of breath.   05/02/2017 at Unknown time  . buPROPion (WELLBUTRIN XL) 300 MG 24 hr tablet Take 300 mg by mouth at bedtime.   05/02/2017 at Unknown time  . clonazePAM (KLONOPIN) 0.5 MG tablet Take 0.5 mg by mouth 2 (two) times daily.    05/02/2017 at Unknown time  . fluticasone furoate-vilanterol (BREO ELLIPTA) 200-25 MCG/INH AEPB Inhale 1 puff into the lungs daily.   05/03/2017 at Unknown time  . gabapentin (NEURONTIN) 600 MG tablet Take 600 mg by mouth 3 (three) times daily as needed (for pain).    Taking  . lamoTRIgine (LAMICTAL) 200 MG tablet Take 300 mg by mouth at bedtime.    05/02/2017  at Unknown time  . levothyroxine (SYNTHROID, LEVOTHROID) 150 MCG tablet Take 1 tablet by mouth daily.   05/02/2017 at Unknown time  . meclizine (ANTIVERT) 25 MG tablet Take 25 mg by mouth 3 (three) times daily as needed for dizziness.   Taking  . ondansetron (ZOFRAN) 4 MG tablet Take 4 mg by mouth every 8 (eight) hours as needed for nausea or vomiting.   Past Month at Unknown time  . traMADol (ULTRAM) 50 MG tablet Take 50-100 mg by mouth every 6 (six) hours as needed for moderate pain.    05/02/2017 at Unknown time  . traZODone (DESYREL) 100 MG tablet Take 100 mg by mouth at bedtime.   05/02/2017 at Unknown time  . cyclobenzaprine (FEXMID) 7.5 MG tablet Take 1 tablet (7.5 mg total) by mouth 3 (three) times daily as needed for muscle spasms. (Patient not taking: Reported on 05/03/2017) 30 tablet 0 Completed Course at Unknown time  . levofloxacin (LEVAQUIN) 750 MG tablet Take 1 tablet (750 mg total) by mouth daily. (Patient not taking: Reported on 04/09/2017) 10 tablet 0 Completed Course at Unknown time  . potassium chloride SA (K-DUR,KLOR-CON) 20 MEQ tablet Take 2 tablets (40 mEq total) by mouth  daily. (Patient not taking: Reported on 04/09/2017) 30 tablet 0 Completed Course at Unknown time  . predniSONE (DELTASONE) 20 MG tablet Take 2 tablets (40 mg total) by mouth 2 (two) times daily with a meal. (Patient not taking: Reported on 04/09/2017) 10 tablet 0 Completed Course at Unknown time   Scheduled: . buPROPion  300 mg Oral QHS  . clonazePAM  0.5 mg Oral BID  . enoxaparin (LOVENOX) injection  40 mg Subcutaneous Q24H  . fluticasone furoate-vilanterol  1 puff Inhalation Daily  . lamoTRIgine  300 mg Oral QHS  . levothyroxine  150 mcg Oral QAC breakfast  . methylPREDNISolone (SOLU-MEDROL) injection  60 mg Intravenous Q6H  . traZODone  100 mg Oral QHS   Continuous:  KVQ:[id number], ipratropium-albuterol, ketorolac, ondansetron (ZOFRAN) IV, traMADol, zolpidem  Assesment: She has shortness of breath increased respiratory effort she had desaturation and she has chest pain. She has a previous history of an interstitial pneumonia. No pneumonia on chest x-ray. I think we should get CT angiogram to rule out pulmonary embolism but will also give [place] a better look at her lung tissue. Active Problems:   Dyspnea   SOB (shortness of breath)    Plan: As above    LOS: 1 day   Krist Rosenboom L 05/04/2017, 11:24 AM

## 2017-05-04 NOTE — Progress Notes (Addendum)
Pt complaining of migraine with nausea with no relief after PRN tramadol given.  Paged Dr York SpanielBuriev via [facility] to notify and request PRN nausea medicine and something else to relieve migraine.  Awaiting response at this time.  Dr York SpanielBuriev placed orders for Zofran and Toradol PRN

## 2017-05-05 DIAGNOSIS — R0602 Shortness of breath: Secondary | ICD-10-CM | POA: Diagnosis not present

## 2017-05-05 DIAGNOSIS — E039 Hypothyroidism, unspecified: Secondary | ICD-10-CM | POA: Diagnosis not present

## 2017-05-05 DIAGNOSIS — R0609 Other forms of dyspnea: Secondary | ICD-10-CM

## 2017-05-05 DIAGNOSIS — R0601 Orthopnea: Secondary | ICD-10-CM | POA: Diagnosis not present

## 2017-05-05 DIAGNOSIS — N921 Excessive and frequent menstruation with irregular cycle: Secondary | ICD-10-CM | POA: Diagnosis not present

## 2017-05-05 DIAGNOSIS — Z8742 Personal history of other diseases of the female genital tract: Secondary | ICD-10-CM | POA: Diagnosis not present

## 2017-05-05 DIAGNOSIS — Z760 Encounter for issue of repeat prescription: Secondary | ICD-10-CM | POA: Diagnosis not present

## 2017-05-05 DIAGNOSIS — Z79899 Other long term (current) drug therapy: Secondary | ICD-10-CM | POA: Diagnosis not present

## 2017-05-05 MED ORDER — PREDNISONE 10 MG PO TABS: ORAL_TABLET | ORAL | 0 refills | Status: DC

## 2017-05-05 NOTE — Progress Notes (Signed)
Patient is to be discharged home. IV and telemetry removed without issue. Patient given discharge instructions, prescriptions and doctor's note. Patient verbalized understanding.  Quita SkyeMorgan P Dishmon, RN

## 2017-05-05 NOTE — Care Management Note (Signed)
Case Management Note  Patient Details  Name: Marisa Johnson MRN: [id number] Date of Birth: 10/15/1975  Subjective/Objective:                  Admitted with resp symptoms. Pt is [place] home, [place], employed as [employer] fund[employer]. Has insurance and no difficulty affording medications.   Action/Plan: Discharging home today with self care. No needs.   Expected Discharge Date:  05/05/17               Expected Discharge Plan:  Home/Self Care  In-House Referral:  NA  Discharge planning Services  CM Consult  Post Acute Care Choice:  NA Choice offered to:  NA  Status of Service:  Completed, signed off  Malcolm MetroChildress, Syed Zukas Demske, RN 05/05/2017, 3:15 PM

## 2017-05-05 NOTE — Discharge Summary (Signed)
Physician Discharge Summary  Marisa Johnson MRN: [id number] DOB/AGE: Jun 27, 1976 41 y.o.  PCP: Benita Stabile, MD   Admit date: 05/03/2017 Discharge date: 05/05/2017  Discharge Diagnoses:    Active Problems:   Dyspnea   SOB (shortness of breath)    Follow-up recommendations Follow-up with PCP in 3-5 days , including all  additional recommended appointments as below Follow-up CBC, CMP in 3-5 days       Current Discharge Medication List    CONTINUE these medications which have CHANGED   Details  predniSONE (DELTASONE) 10 MG tablet 5 tablets 5 days, 4 tablets 5 days, 3 tablets 5 days, 2 tablets 5 days, 1 tablet 5 days Qty: 120 tablet, Refills: 0      CONTINUE these medications which have NOT CHANGED   Details  albuterol (PROVENTIL HFA;VENTOLIN HFA) 108 (90 Base) MCG/ACT inhaler Inhale 2 puffs into the lungs every 6 (six) hours as needed for wheezing or shortness of breath.    buPROPion (WELLBUTRIN XL) 300 MG 24 hr tablet Take 300 mg by mouth at bedtime.    clonazePAM (KLONOPIN) 0.5 MG tablet Take 0.5 mg by mouth 2 (two) times daily.     fluticasone furoate-vilanterol (BREO ELLIPTA) 200-25 MCG/INH AEPB Inhale 1 puff into the lungs daily.    gabapentin (NEURONTIN) 600 MG tablet Take 600 mg by mouth 3 (three) times daily as needed (for pain).     lamoTRIgine (LAMICTAL) 200 MG tablet Take 300 mg by mouth at bedtime.     levothyroxine (SYNTHROID, LEVOTHROID) 150 MCG tablet Take 1 tablet by mouth daily.    meclizine (ANTIVERT) 25 MG tablet Take 25 mg by mouth 3 (three) times daily as needed for dizziness.    ondansetron (ZOFRAN) 4 MG tablet Take 4 mg by mouth every 8 (eight) hours as needed for nausea or vomiting.    traMADol (ULTRAM) 50 MG tablet Take 50-100 mg by mouth every 6 (six) hours as needed for moderate pain.     traZODone (DESYREL) 100 MG tablet Take 100 mg by mouth at bedtime.    cyclobenzaprine (FEXMID) 7.5 MG tablet Take 1 tablet (7.5 mg total) by mouth  3 (three) times daily as needed for muscle spasms. Qty: 30 tablet, Refills: 0    potassium chloride SA (K-DUR,KLOR-CON) 20 MEQ tablet Take 2 tablets (40 mEq total) by mouth daily. Qty: 30 tablet, Refills: 0      STOP taking these medications     levofloxacin (LEVAQUIN) 750 MG tablet          Discharge Condition:  Stable   Discharge Instructions Get Medicines reviewed and adjusted: Please take all your medications with you for your next visit with your Primary MD  Please request your Primary MD to go over all hospital tests and procedure/radiological results at the follow up, please ask your Primary MD to get all Hospital records sent to his/her office.  If you experience worsening of your admission symptoms, develop shortness of breath, life threatening emergency, suicidal or homicidal thoughts you must seek medical attention immediately by calling 911 or calling your MD immediately if symptoms less severe.  You must read complete instructions/literature along with all the possible adverse reactions/side effects for all the Medicines you take and that have been prescribed to you. Take any new Medicines after you have completely understood and accpet all the possible adverse reactions/side effects.   Do not drive when taking Pain medications.   Do not take more than prescribed Pain, Sleep and Anxiety  Medications  Special Instructions: If you have smoked or chewed Tobacco in the last 2 yrs please stop smoking, stop any regular Alcohol and or any Recreational drug use.  Wear Seat belts while driving.  Please note  You were cared for by a hospitalist during your hospital stay. Once you are discharged, your primary care physician will handle any further medical issues. Please note that NO REFILLS for any discharge medications will be authorized once you are discharged, as it is imperative that you return to your primary care physician (or establish a relationship with a primary  care physician if you do not have one) for your aftercare needs so that they can reassess your need for medications and monitor your lab values.  Discharge Instructions    Diet - low sodium heart healthy    Complete by:  As directed    Increase activity slowly    Complete by:  As directed        Allergies  Allergen Reactions  . Abilify [Aripiprazole] Other (See Comments)    Reaction:  Restless leg syndrome   . Reglan [Metoclopramide] Other (See Comments)    Reaction:  Hallucinations   . Dexamethasone Rash      Disposition: 01-Home or Self Care   Consults:  Pulmonary    Significant Diagnostic Studies:  Dg Chest 2 View  Result Date: 05/03/2017 CLINICAL DATA:  Cough. Dyspnea on exertion. Right pleuritic chest pain EXAM: CHEST  2 VIEW COMPARISON:  Chest CT 04/03/2017 FINDINGS: Normal heart size and mediastinal contours. No acute infiltrate or edema. No effusion or pneumothorax. No acute osseous findings. EKG leads create artifact over the chest. IMPRESSION: Negative chest. Electronically Signed   By: Marnee Spring M.D.   On: 05/03/2017 11:13   Ct Angio Chest Pe W Or Wo Contrast  Result Date: 05/04/2017 CLINICAL DATA:  Dyspnea with cough and right-sided chest pain as well as shortness of breath and fatigue. EXAM: CT ANGIOGRAPHY CHEST WITH CONTRAST TECHNIQUE: Multidetector CT imaging of the chest was performed using the standard protocol during bolus administration of intravenous contrast. Multiplanar CT image reconstructions and MIPs were obtained to evaluate the vascular anatomy. CONTRAST:  100 mL Isovue 370 IV. COMPARISON:  03/15/2017 and 09/24/2016 FINDINGS: Cardiovascular: Heart is normal size. Pulmonary arterial system is within normal without emboli. Thoracic aorta is unremarkable. Mediastinum/Nodes: No evidence of mediastinal hilar adenopathy. Remaining mediastinal structures are within normal. Lungs/Pleura: Lungs are adequately inflated without focal lobar consolidation or  effusion. Scant amount left pleural fluid. Airways are normal. Upper Abdomen: Within normal. Musculoskeletal: Within normal. Review of the MIP images confirms the above findings. IMPRESSION: No evidence of pulmonary embolism. Scant amount left pleural fluid, otherwise no acute cardiopulmonary disease. Electronically Signed   By: Elberta Fortis M.D.   On: 05/04/2017 14:27           Filed Weights   05/03/17 1018 05/03/17 1532  Weight: 83.9 kg (185 lb) 83.4 kg (183 lb 12.8 oz)     Microbiology: No results found for this or any previous visit (from the past 240 hour(s)).     Blood Culture    Component Value Date/Time   SDES SITE NOT SPECIFIED 03/15/2017 2230   SPECREQUEST  03/15/2017 2230    Blood Culture results may not be optimal due to an inadequate volume of blood received in culture bottles   CULT NO GROWTH 5 DAYS 03/15/2017 2230   REPTSTATUS 03/20/2017 FINAL 03/15/2017 2230      Labs: Results for orders  placed or performed during the hospital encounter of 05/03/17 (from the past 48 hour(s))  TSH     Status: Abnormal   Collection Time: 05/03/17  4:25 PM  Result Value Ref Range   TSH 0.218 (L) 0.350 - 4.500 uIU/mL    Comment: Performed by a 3rd Generation assay with a functional sensitivity of <=0.01 uIU/mL.  Sedimentation rate     Status: None   Collection Time: 05/03/17  4:25 PM  Result Value Ref Range   Sed Rate 2 0 - 22 mm/hr  C-reactive protein     Status: None   Collection Time: 05/03/17  4:25 PM  Result Value Ref Range   CRP <0.8 <1.0 mg/dL    Comment: Performed at [facility], 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 Patient states that she has progressive dyspnea, dyspnea on exertion with mild activity. Yesterday, she developed non productive cough with right sided chest pain. Denies exertional chest pains. She felt tired and fatigued. Denies PND or orthopnea, no leg edema. She reports chronic bilateral thigh muscle pains, shoulder muscle pains, denies joint pains. No fever, no abdominal pains, no nausea, vomiting diarrhea -ED: patient desat to 91% with ambulation with respiratory rate increased to 50's  HOSPITAL COURSE:  Shortness of breath, tachypnea, DOE of unclear etiology. Recent similar presentation, thought due to intetirtial pneumonia or pneumonitis and responded to steroids, and antibiotics. Echo (03/2017): The cavity size was normal. Wall thickness wasnormal. Systolic function was normal. The estimated ejectionfraction was in the range of 60% to 65%. CT chest showed did not show any PE, scant amount of left pleural fluid, no acute cardiopulmonary disease -started iv solumedrol again, bronchodilators, pend TB gold test, anca. sed rate, crp: unremarkable. Ordered ct chest per pulmonology eval. Appreciate the input   Hypothyroidism,  TSH 0.218, free t 4 pending   Anxiety, depression on clonopin.  Questionable chronic fatigue syndrome, fibromyalgia.     Discharge Exam:   Blood pressure 122/67, pulse 90, temperature 98.3 F (36.8 C), temperature source Oral, resp. rate 18, height 5\' 3"  (1.6 m), weight 83.4 kg (183 lb 12.8 oz), last menstrual period 04/17/2017, SpO2 98 %.    General:  No distress   Cardiovascular: 1s, s2 rrr  Respiratory: CTA BL  Abdomen: soft, nt  Musculoskeletal: no leg  edema     Follow-up Information    [facility] Follow up.   Specialty:  Emergency Medicine Contact information: [address] [id number] [address] [place] [id number] [phone]          Signed: Richarda Overlieayana Polly Barner 05/05/2017, 2:58 PM        Time spent >1 hour

## 2017-05-05 NOTE — Progress Notes (Signed)
Subjective: She says she feels okay. She's back to baseline. She had CT of the chest done yesterday and it did not show pulmonary emboli or any interstitial changes.  Objective: Vital signs in last 24 hours: Temp:  [98.3 F (36.8 C)-99.5 F (37.5 C)] 98.3 F (36.8 C) (07/23 0533) Pulse Rate:  [62-94] 62 (07/23 0533) Resp:  [19] 19 (07/23 0533) BP: (105-129)/(55-75) 105/55 (07/23 0533) SpO2:  [95 %-97 %] 97 % (07/23 0819) Weight change:     Intake/Output from previous day: 07/22 0701 - 07/23 0700 In: 1200 [P.O.:1200] Out: 1950 [Urine:1950]  PHYSICAL EXAM General appearance: alert, cooperative and no distress Resp: clear to auscultation bilaterally Cardio: regular rate and rhythm, S1, S2 normal, no murmur, click, rub or gallop GI: soft, non-tender; bowel sounds normal; no masses,  no organomegaly Extremities: extremities normal, atraumatic, no cyanosis or edema Skin warm and dry  Lab Results:  Results for orders placed or performed during the hospital encounter of 05/03/17 (from the past 48 hour(s))  CBC with Differential     Status: None   Collection Time: 05/03/17 10:41 AM  Result Value Ref Range   WBC 5.2 4.0 - 10.5 K/uL   RBC 4.33 3.87 - 5.11 MIL/uL   Hemoglobin 13.2 12.0 - 15.0 g/dL   HCT 38.9 36.0 - 46.0 %   MCV 89.8 78.0 - 100.0 fL   MCH 30.5 26.0 - 34.0 pg   MCHC 33.9 30.0 - 36.0 g/dL   RDW 13.8 11.5 - 15.5 %   Platelets 238 150 - 400 K/uL   Neutrophils Relative % 42 %   Neutro Abs 2.2 1.7 - 7.7 K/uL   Lymphocytes Relative 44 %   Lymphs Abs 2.3 0.7 - 4.0 K/uL   Monocytes Relative 10 %   Monocytes Absolute 0.5 0.1 - 1.0 K/uL   Eosinophils Relative 3 %   Eosinophils Absolute 0.2 0.0 - 0.7 K/uL   Basophils Relative 1 %   Basophils Absolute 0.0 0.0 - 0.1 K/uL  Comprehensive metabolic panel     Status: None   Collection Time: 05/03/17 10:41 AM  Result Value Ref Range   Sodium 141 135 - 145 mmol/L   Potassium 3.5 3.5 - 5.1 mmol/L   Chloride 108 101 - 111  mmol/L   CO2 24 22 - 32 mmol/L   Glucose, Bld 95 65 - 99 mg/dL   BUN 13 6 - 20 mg/dL   Creatinine, Ser 0.87 0.44 - 1.00 mg/dL   Calcium 9.5 8.9 - 10.3 mg/dL   Total Protein 7.2 6.5 - 8.1 g/dL   Albumin 4.3 3.5 - 5.0 g/dL   AST 15 15 - 41 U/L   ALT 14 14 - 54 U/L   Alkaline Phosphatase 52 38 - 126 U/L   Total Bilirubin 0.9 0.3 - 1.2 mg/dL   GFR calc non Af Amer >60 >60 mL/min   GFR calc Af Amer >60 >60 mL/min    Comment: (NOTE) The eGFR has been calculated using the CKD EPI equation. This calculation has not been validated in all clinical situations. eGFR's persistently <60 mL/min signify possible Chronic Kidney Disease.    Anion gap 9 5 - 15  Brain natriuretic peptide     Status: None   Collection Time: 05/03/17 10:41 AM  Result Value Ref Range   B Natriuretic Peptide 16.0 0.0 - 100.0 pg/mL  D-dimer, quantitative (not at [facility])     Status: None   Collection Time: 05/03/17 10:45 AM  Result Value Ref  Range   D-Dimer, Quant <0.27 0.00 - 0.50 ug/mL-FEU    Comment: (NOTE) At the manufacturer cut-off of 0.50 ug/mL FEU, this assay has been documented to exclude PE with a sensitivity and negative predictive value of 97 to 99%.  At this time, this assay has not been approved by the FDA to exclude DVT/VTE. Results should be correlated with clinical presentation.   I-Stat Troponin, ED (not at [facility])     Status: None   Collection Time: 05/03/17 11:04 AM  Result Value Ref Range   Troponin i, poc 0.00 0.00 - 0.08 ng/mL   Comment 3            Comment: Due to the release kinetics of cTnI, a negative result within the first hours of the onset of symptoms does not rule out myocardial infarction with certainty. If myocardial infarction is still suspected, repeat the test at appropriate intervals.   I-Stat Beta hCG blood, ED ([facility], [facility], [facility] only)     Status: None   Collection Time: 05/03/17 11:43 AM  Result Value Ref Range   I-stat hCG, quantitative <5.0 <5 mIU/mL   Comment 3             Comment:   GEST. AGE      CONC.  (mIU/mL)   <=1 WEEK        5 - 50     2 WEEKS       50 - 500     3 WEEKS       100 - 10,000     4 WEEKS     1,000 - 30,000        FEMALE AND NON-PREGNANT FEMALE:     LESS THAN 5 mIU/mL   TSH     Status: Abnormal   Collection Time: 05/03/17  4:25 PM  Result Value Ref Range   TSH 0.218 (L) 0.350 - 4.500 uIU/mL    Comment: Performed by a 3rd Generation assay with a functional sensitivity of <=0.01 uIU/mL.  Sedimentation rate     Status: None   Collection Time: 05/03/17  4:25 PM  Result Value Ref Range   Sed Rate 2 0 - 22 mm/hr  C-reactive protein     Status: None   Collection Time: 05/03/17  4:25 PM  Result Value Ref Range   CRP <0.8 <1.0 mg/dL    Comment: Performed at [facility] [address] [place], [place] [address]  Troponin I (q 6hr x 3)     Status: None   Collection Time: 05/03/17  4:25 PM  Result Value Ref Range   Troponin I <0.03 <0.03 ng/mL  Troponin I (q 6hr x 3)     Status: None   Collection Time: 05/03/17  9:40 PM  Result Value Ref Range   Troponin I <0.03 <0.03 ng/mL  Troponin I (q 6hr x 3)     Status: None   Collection Time: 05/04/17  4:29 AM  Result Value Ref Range   Troponin I <0.03 <0.03 ng/mL    ABGS No results for input(s): PHART, PO2ART, TCO2, HCO3 in the last 72 hours.  Invalid input(s): PCO2 CULTURES No results found for this or any previous visit (from the past 240 hour(s)). Studies/Results: Dg Chest 2 View  Result Date: 05/03/2017 CLINICAL DATA:  Cough. Dyspnea on exertion. Right pleuritic chest pain EXAM: CHEST  2 VIEW COMPARISON:  Chest CT 04/03/2017 FINDINGS: Normal heart size and mediastinal contours. No acute infiltrate or edema. No  effusion or pneumothorax. No acute osseous findings. EKG leads create artifact over the chest. IMPRESSION: Negative chest. Electronically Signed   By: Monte Fantasia M.D.   On: 05/03/2017 11:13   Ct Angio Chest Pe W Or Wo Contrast  Result Date: 05/04/2017 CLINICAL DATA:   Dyspnea with cough and right-sided chest pain as well as shortness of breath and fatigue. EXAM: CT ANGIOGRAPHY CHEST WITH CONTRAST TECHNIQUE: Multidetector CT imaging of the chest was performed using the standard protocol during bolus administration of intravenous contrast. Multiplanar CT image reconstructions and MIPs were obtained to evaluate the vascular anatomy. CONTRAST:  100 mL Isovue 370 IV. COMPARISON:  03/15/2017 and 09/24/2016 FINDINGS: Cardiovascular: Heart is normal size. Pulmonary arterial system is within normal without emboli. Thoracic aorta is unremarkable. Mediastinum/Nodes: No evidence of mediastinal hilar adenopathy. Remaining mediastinal structures are within normal. Lungs/Pleura: Lungs are adequately inflated without focal lobar consolidation or effusion. Scant amount left pleural fluid. Airways are normal. Upper Abdomen: Within normal. Musculoskeletal: Within normal. Review of the MIP images confirms the above findings. IMPRESSION: No evidence of pulmonary embolism. Scant amount left pleural fluid, otherwise no acute cardiopulmonary disease. Electronically Signed   By: Marin Olp M.D.   On: 05/04/2017 14:27    Medications:  Prior to Admission:  Prescriptions Prior to Admission  Medication Sig Dispense Refill Last Dose  . albuterol (PROVENTIL HFA;VENTOLIN HFA) 108 (90 Base) MCG/ACT inhaler Inhale 2 puffs into the lungs every 6 (six) hours as needed for wheezing or shortness of breath.   05/02/2017 at Unknown time  . buPROPion (WELLBUTRIN XL) 300 MG 24 hr tablet Take 300 mg by mouth at bedtime.   05/02/2017 at Unknown time  . clonazePAM (KLONOPIN) 0.5 MG tablet Take 0.5 mg by mouth 2 (two) times daily.    05/02/2017 at Unknown time  . fluticasone furoate-vilanterol (BREO ELLIPTA) 200-25 MCG/INH AEPB Inhale 1 puff into the lungs daily.   05/03/2017 at Unknown time  . gabapentin (NEURONTIN) 600 MG tablet Take 600 mg by mouth 3 (three) times daily as needed (for pain).    Taking  .  lamoTRIgine (LAMICTAL) 200 MG tablet Take 300 mg by mouth at bedtime.    05/02/2017 at Unknown time  . levothyroxine (SYNTHROID, LEVOTHROID) 150 MCG tablet Take 1 tablet by mouth daily.   05/02/2017 at Unknown time  . meclizine (ANTIVERT) 25 MG tablet Take 25 mg by mouth 3 (three) times daily as needed for dizziness.   Taking  . ondansetron (ZOFRAN) 4 MG tablet Take 4 mg by mouth every 8 (eight) hours as needed for nausea or vomiting.   Past Month at Unknown time  . traMADol (ULTRAM) 50 MG tablet Take 50-100 mg by mouth every 6 (six) hours as needed for moderate pain.    05/02/2017 at Unknown time  . traZODone (DESYREL) 100 MG tablet Take 100 mg by mouth at bedtime.   05/02/2017 at Unknown time  . cyclobenzaprine (FEXMID) 7.5 MG tablet Take 1 tablet (7.5 mg total) by mouth 3 (three) times daily as needed for muscle spasms. (Patient not taking: Reported on 05/03/2017) 30 tablet 0 Completed Course at Unknown time  . levofloxacin (LEVAQUIN) 750 MG tablet Take 1 tablet (750 mg total) by mouth daily. (Patient not taking: Reported on 04/09/2017) 10 tablet 0 Completed Course at Unknown time  . potassium chloride SA (K-DUR,KLOR-CON) 20 MEQ tablet Take 2 tablets (40 mEq total) by mouth daily. (Patient not taking: Reported on 04/09/2017) 30 tablet 0 Completed Course at Unknown time  .  predniSONE (DELTASONE) 20 MG tablet Take 2 tablets (40 mg total) by mouth 2 (two) times daily with a meal. (Patient not taking: Reported on 04/09/2017) 10 tablet 0 Completed Course at Unknown time   Scheduled: . buPROPion  300 mg Oral QHS  . clonazePAM  0.5 mg Oral BID  . enoxaparin (LOVENOX) injection  40 mg Subcutaneous Q24H  . fluticasone furoate-vilanterol  1 puff Inhalation Daily  . lamoTRIgine  300 mg Oral QHS  . levothyroxine  150 mcg Oral QAC breakfast  . methylPREDNISolone (SOLU-MEDROL) injection  60 mg Intravenous Q6H  . traZODone  100 mg Oral QHS   Continuous:  HFW:YOVZCHYIFO, ipratropium-albuterol, ketorolac,  ondansetron (ZOFRAN) IV, traMADol, zolpidem  Assesment: She was brought in because of shortness of breath and tachypnea and hypoxia. She is much improved. No etiology has been found. From a strictly pulmonary point of view she could be discharged home probably on a prednisone taper. She is artery scheduled for outpatient pulmonary function testing. Active Problems:   Dyspnea   SOB (shortness of breath)    Plan: As above. I will plan to sign off    LOS: 2 days   Katelan Hirt L 05/05/2017, 9:02 AM

## 2017-05-06 ENCOUNTER — Other Ambulatory Visit: Payer: Self-pay | Admitting: *Deleted

## 2017-05-06 LAB — MPO/PR-3 (ANCA) ANTIBODIES
ANCA Proteinase 3: <3.5 U/mL (ref 0.0–3.5)
Myeloperoxidase Abs: <9 U/mL (ref 0.0–9.0)

## 2017-05-06 LAB — T4, FREE: Free T4: 1.35 ng/dL — ABNORMAL HIGH (ref 0.61–1.12)

## 2017-05-06 LAB — ANCA TITERS
Atypical P-ANCA titer: NEGATIVE titer
C ANCA: NEGATIVE {titer}
P-ANCA: NEGATIVE titer

## 2017-05-06 NOTE — Patient Outreach (Signed)
[facility]) Care Management  05/06/2017  Elmore GuiseLori A Hutchens May 28, 1976 [id number]   Subjective: Telephone call to patient's home number, no answer, left HIPAA compliant voicemail message, and requested call back.  Objective: Per chart review, patient hospitalized 05/03/17 - 05/05/17 for shortness of breath.  Patient hospitalized 03/15/17 - 03/20/17 for Interstitial pneumonia.    Patient also has a history of : hypothyroidism, Irritable bowel syndrome andSjogren's syndrome with lung involvement.      Assessment:  Received [facility] Transition of care referral on 05/06/17.   Transition of care follow up pending patient contact.    Plan: RNCM will call patient for 2nd telephone outreach attempt, transition of care follow up, within 10 business days if no return call.    Gaspard Isbell H. Gardiner Barefootooper RN, BSN, CCM [facility] Care Management [facility] Telephonic CM Phone: [phone] Fax: [phone]

## 2017-05-08 ENCOUNTER — Other Ambulatory Visit: Payer: Self-pay | Admitting: *Deleted

## 2017-05-08 NOTE — Patient Outreach (Signed)
[facility]) Care Management  05/08/2017  Marisa Johnson Mar 31, 1976 [id number]  Subjective: Telephone call to patient's home number, no answer, left HIPAA compliant voicemail message, and requested call back.  Objective: Per chart review, patient hospitalized 05/03/17 - 05/05/17 for shortness of breath.  Patient hospitalized 03/15/17 - 03/20/17 for Interstitial pneumonia. Patient also has a history of : hypothyroidism, Irritable bowel syndrome andSjogren's syndrome with lung involvement.    Assessment:Received [facility] Transition of care referral on 05/06/17. Transition of care follow up pending patient contact.    Plan: RNCM will call patient for 3rd telephone outreach attempt, transition of care follow up, within 10 business days if no return call.    Colinda Barth H. Gardiner Barefootooper RN, BSN, CCM [facility] Care [facility] Telephonic CM Phone: [phone] Fax: [phone]

## 2017-05-09 ENCOUNTER — Encounter: Payer: Self-pay | Admitting: *Deleted

## 2017-05-09 ENCOUNTER — Other Ambulatory Visit: Payer: Self-pay | Admitting: *Deleted

## 2017-05-09 NOTE — Patient Outreach (Addendum)
[facility]) Care Management  05/09/2017  Elmore GuiseLori A Hutchens 08-24-1976 [id number]  Subjective: Telephone call to patient's home number, spoke with patient, and HIPAA verified.  [facility] Transition of care follow up, patient voiced understanding, and is in agreement to follow up.   Patient states she is feeling better, was diagnosed with reactive airway disease, still has a dry cough, and has a follow up appointment with pulmonologist on 05/13/17.  States she is planning to discuss evaluation for portable oxygen with MD, questioning if needed when she is outside in the heat.   Patient voices understanding of medical diagnosis and treatment plan.  States she is accessing the following [facility] benefits: outpatient pharmacy, hospital indemnity, and has family medical leave act (FMLA) in place with [employer].  States she will notify this RNCM if Asthma Educator referral needed.  Patient states she does not have any education material, transition of care, care coordination, disease management, disease monitoring, transportation, community resource, or pharmacy needs at this time. States she is very appreciative of the follow up and is in agreement to receive [facility] information.   Objective: Per chart review, patient hospitalized 05/03/17 - 05/05/17 for shortness of breath. Patient hospitalized 03/15/17 - 03/20/17 for Interstitial pneumonia. Patient also has a history of : hypothyroidism, Irritable bowel syndrome andSjogren's syndrome with lung involvement.    Assessment:Received [facility] Transition of care referral on 05/06/17. Transition of care follow up completed, no care management needs, and will proceed with case closure.   Plan: RNCM will send patient successful outreach letter, [facility] pamphlet, and magnet. RNCM will send case closure due to follow up completed / no care management needs request to Iverson AlaminLaura Greeson [facility].    Alexine Pilant H. Gardiner Barefootooper RN, BSN, CCM [facility] Care Management [facility] Telephonic CM Phone: [phone] Fax: [phone]

## 2017-05-13 ENCOUNTER — Ambulatory Visit (HOSPITAL_COMMUNITY)
Admission: RE | Admit: 2017-05-13 | Discharge: 2017-05-13 | Disposition: A | Discharge status: Home / Self Care | Payer: 59 | Source: Ambulatory Visit | Attending: Pulmonary Disease | Admitting: Pulmonary Disease

## 2017-05-13 ENCOUNTER — Other Ambulatory Visit (HOSPITAL_COMMUNITY): Payer: Self-pay | Admitting: Respiratory Therapy

## 2017-05-13 DIAGNOSIS — J45909 Unspecified asthma, uncomplicated: Secondary | ICD-10-CM

## 2017-05-13 MED ORDER — ALBUTEROL SULFATE (2.5 MG/3ML) 0.083% IN NEBU
2.5000 mg | INHALATION_SOLUTION | Freq: Once | RESPIRATORY_TRACT | Status: AC
  Administered 2017-05-13: 2.5 mg via RESPIRATORY_TRACT

## 2017-05-15 DIAGNOSIS — R0602 Shortness of breath: Secondary | ICD-10-CM | POA: Diagnosis not present

## 2017-05-15 DIAGNOSIS — J45909 Unspecified asthma, uncomplicated: Secondary | ICD-10-CM | POA: Diagnosis not present

## 2017-05-18 LAB — PULMONARY FUNCTION TEST
DL/VA % PRED: 100 %
DL/VA: 4.68 ml/min/mmHg/L
DLCO unc % pred: 85 %
DLCO unc: 19.6 ml/min/mmHg
FEF 25-75 Post: 4.42 L/sec
FEF 25-75 Pre: 4.35 L/sec
FEF2575-%Change-Post: 1 %
FEF2575-%PRED-POST: 145 %
FEF2575-%PRED-PRE: 142 %
FEV1-%CHANGE-POST: 3 %
FEV1-%Pred-Post: 96 %
FEV1-%Pred-Pre: 93 %
FEV1-Post: 2.81 L
FEV1-Pre: 2.72 L
FEV1FVC-%CHANGE-POST: 1 %
FEV1FVC-%Pred-Pre: 108 %
FEV6-%Change-Post: 1 %
FEV6-%PRED-PRE: 87 %
FEV6-%Pred-Post: 89 %
FEV6-Post: 3.13 L
FEV6-Pre: 3.08 L
FEV6FVC-%PRED-PRE: 102 %
FEV6FVC-%Pred-Post: 102 %
FVC-%Change-Post: 1 %
FVC-%PRED-PRE: 86 %
FVC-%Pred-Post: 87 %
FVC-PRE: 3.08 L
FVC-Post: 3.13 L
POST FEV1/FVC RATIO: 90 %
POST FEV6/FVC RATIO: 100 %
PRE FEV6/FVC RATIO: 100 %
Pre FEV1/FVC ratio: 89 %
RV % pred: 91 %
RV: 1.42 L
TLC % PRED: 92 %
TLC: 4.51 L

## 2017-05-22 ENCOUNTER — Encounter (HOSPITAL_COMMUNITY): Payer: Self-pay

## 2017-05-27 ENCOUNTER — Encounter (HOSPITAL_COMMUNITY): Payer: Self-pay

## 2017-06-05 ENCOUNTER — Encounter (HOSPITAL_COMMUNITY): Payer: Self-pay | Admitting: *Deleted

## 2017-06-05 ENCOUNTER — Ambulatory Visit (HOSPITAL_COMMUNITY): Payer: 59 | Attending: Pulmonary Disease

## 2017-06-05 DIAGNOSIS — J45909 Unspecified asthma, uncomplicated: Secondary | ICD-10-CM | POA: Diagnosis not present

## 2017-06-05 DIAGNOSIS — R0600 Dyspnea, unspecified: Secondary | ICD-10-CM

## 2017-06-06 ENCOUNTER — Other Ambulatory Visit (HOSPITAL_COMMUNITY): Payer: Self-pay | Admitting: *Deleted

## 2017-06-06 DIAGNOSIS — R0600 Dyspnea, unspecified: Secondary | ICD-10-CM

## 2017-06-06 DIAGNOSIS — R0609 Other forms of dyspnea: Principal | ICD-10-CM

## 2017-06-13 DIAGNOSIS — R0600 Dyspnea, unspecified: Secondary | ICD-10-CM | POA: Diagnosis not present

## 2017-06-25 DIAGNOSIS — F3181 Bipolar II disorder: Secondary | ICD-10-CM | POA: Diagnosis not present

## 2017-07-17 ENCOUNTER — Encounter (HOSPITAL_COMMUNITY): Payer: Self-pay | Admitting: Emergency Medicine

## 2017-07-17 ENCOUNTER — Ambulatory Visit (HOSPITAL_COMMUNITY)
Admission: EM | Admit: 2017-07-17 | Discharge: 2017-07-17 | Disposition: A | Discharge status: Home / Self Care | Payer: 59 | Attending: Family Medicine | Admitting: Family Medicine

## 2017-07-17 DIAGNOSIS — M545 Low back pain: Secondary | ICD-10-CM | POA: Diagnosis not present

## 2017-07-17 DIAGNOSIS — S39012A Strain of muscle, fascia and tendon of lower back, initial encounter: Secondary | ICD-10-CM | POA: Diagnosis not present

## 2017-07-17 LAB — POCT URINALYSIS DIP (DEVICE)
Bilirubin Urine: NEGATIVE
Glucose, UA: NEGATIVE mg/dL
KETONES UR: NEGATIVE mg/dL
Leukocytes, UA: NEGATIVE
Nitrite: NEGATIVE
PH: 6 (ref 5.0–8.0)
PROTEIN: NEGATIVE mg/dL
Urobilinogen, UA: 0.2 mg/dL (ref 0.0–1.0)

## 2017-07-17 MED ORDER — PREDNISONE 20 MG PO TABS: ORAL_TABLET | ORAL | 0 refills | Status: DC

## 2017-07-17 MED ORDER — PREDNISONE 20 MG PO TABS
20.0000 mg | ORAL_TABLET | Freq: Once | ORAL | Status: AC
  Administered 2017-07-17: 20 mg via ORAL

## 2017-07-17 MED ORDER — PREDNISONE 20 MG PO TABS
ORAL_TABLET | ORAL | Status: AC
  Filled 2017-07-17: qty 1

## 2017-07-17 MED ORDER — ONDANSETRON HCL 4 MG PO TABS: 4.0000 mg | ORAL_TABLET | Freq: Three times a day (TID) | ORAL | 1 refills | Status: DC | PRN

## 2017-07-17 NOTE — ED Provider Notes (Signed)
[facility] CARE CENTER   [id number] 07/17/17 Arrival Time: 1839   SUBJECTIVE:  Marisa Johnson is a 41 y.o. female who presents to the urgent care with complaint of left sided lower back and flank pain intermittently x 3 weeks  She recalls no specific injury that started and the pain began insidiously. She works at the hospital on night shift but this last week he's been doing a lot of work with flu shots during the day.  Patient has a long history of recurring back pain. In the past the problem has been in the rhomboid area and trapezius area of her back. She was diagnosed with mixed connective tissue disorder in the past and every time she has a bout of this kind of pain, she seems to respond well to steroids.     Past Medical History:  Diagnosis Date  . Anxiety   . Carpal tunnel syndrome   . Contraceptive management 08/09/2015  . Decreased libido 08/09/2015  . Friable cervix 08/09/2015  . History of abnormal cervical Pap smear 08/09/2015  . IBS (irritable bowel syndrome)   . Irregular intermenstrual bleeding 08/09/2015  . Major depressive disorder   . Migraine   . Pseudobulbar affect   . PTSD (post-traumatic stress disorder)   . Thyroid disease   . Vaginal Pap smear, abnormal    Family History  Problem Relation Age of Onset  . Diabetes Mother   . Hypertension Mother   . Hypertension Father   . Stroke Father   . Cancer Maternal Grandmother   . Kidney disease Paternal Grandmother   . Hypertension Paternal Grandmother   . Hyperlipidemia Paternal Grandmother   . Heart disease Paternal Grandmother   . Heart attack Paternal Grandmother   . Diabetes Daughter   . Hypothyroidism Daughter   . Anxiety disorder Sister   . Anxiety disorder Brother   . Anxiety disorder Sister   . Colon cancer Neg Hx    Social History   Social History  . Marital status: Divorced    Spouse name: N/A  . Number of children: N/A  . Years of education: N/A   Occupational History  . 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 Smoking status: Never Smoker  . Smokeless tobacco: Never Used     Comment: Never smoker  . Alcohol use Yes     Comment: occ.  . Drug use: No  . Sexual activity: Yes    Birth control/ protection: OCP, Pill   Other Topics Concern  . Not on file   Social History Narrative  . No narrative on file   No outpatient prescriptions have been marked as taking for the 07/17/17 encounter [facility] Encounter).   Allergies  Allergen Reactions  . Abilify [Aripiprazole] Other (See Comments)    Reaction:  Restless leg syndrome   . Reglan [Metoclopramide] Other (See Comments)    Reaction:  Hallucinations   . Dexamethasone Rash      ROS: As per HPI, remainder of ROS negative.   OBJECTIVE:   Vitals:   07/17/17 1905  BP: 126/75  Pulse: 72  Resp: 18  Temp: (!) 97.5 F (36.4 C)  TempSrc: Oral  SpO2: 100%  Weight: 187 lb (84.8 kg)  Height:  (1.6 m)     General appearance: alert; no distress Eyes: PERRL; EOMI; conjunctiva normal HENT: normocephalic; atraumatic; TMs normal, canal normal, external ears normal without trauma; nasal mucosa normal; oral mucosa normal Neck: supple Lungs:  clear to auscultation bilaterally Heart: regular rate and rhythm Abdomen: soft, non-tender; bowel sounds normal; no masses or organomegaly; no guarding or rebound tenderness Back: no CVA tenderness Extremities: no cyanosis or edema; symmetrical with no gross deformitiesPatient has reproduced pain when palpating over the left 12th rib and when bending forward to touch her toes.  Skin: warm and Johnson Neurologic: normal gait; grossly normal Psychological: alert and cooperative; normal mood and affect      Labs:  Results for orders placed or performed during the hospital encounter of 07/17/17  POCT urinalysis dip (device)  Result Value Ref Range   Glucose, UA NEGATIVE NEGATIVE mg/dL   Bilirubin Urine NEGATIVE NEGATIVE   Ketones, ur NEGATIVE  NEGATIVE mg/dL   Specific Gravity, Urine >=1.030 1.005 - 1.030   Hgb urine dipstick MODERATE (A) NEGATIVE   pH 6.0 5.0 - 8.0   Protein, ur NEGATIVE NEGATIVE mg/dL   Urobilinogen, UA 0.2 0.0 - 1.0 mg/dL   Nitrite NEGATIVE NEGATIVE   Leukocytes, UA NEGATIVE NEGATIVE    Labs Reviewed  POCT URINALYSIS DIP (DEVICE) - Abnormal; Notable for the following:       Result Value   Hgb urine dipstick MODERATE (*)    All other components within normal limits    No results found.     ASSESSMENT & PLAN:  1. Strain of lumbar region, initial encounter     Meds ordered this encounter  Medications  . ondansetron (ZOFRAN) 4 MG tablet    Sig: Take 1 tablet (4 mg total) by mouth every 8 (eight) hours as needed for nausea or vomiting.    Dispense:  20 tablet    Refill:  1  . predniSONE (DELTASONE) 20 MG tablet    Sig: Two daily with food    Dispense:  10 tablet    Refill:  0  . predniSONE (DELTASONE) tablet 20 mg    Reviewed expectations re: course of current medical issues. Questions answered. Outlined signs and symptoms indicating need for more acute intervention. Patient verbalized understanding. After Visit Summary given.    Procedures:      Elvina Sidle, MD 07/17/17 1942

## 2017-07-17 NOTE — ED Triage Notes (Signed)
Pt c/o left sided lower back and flank pain intermittently x 3 weeks

## 2017-07-17 NOTE — Discharge Instructions (Signed)
If not improving in 2 days, please return or see your doctor

## 2017-07-25 ENCOUNTER — Ambulatory Visit (INDEPENDENT_AMBULATORY_CARE_PROVIDER_SITE_OTHER): Payer: 59 | Admitting: Adult Health

## 2017-07-25 ENCOUNTER — Encounter (HOSPITAL_COMMUNITY)
Admission: RE | Disposition: A | Discharge status: Home / Self Care | Payer: Self-pay | Source: Ambulatory Visit | Attending: Obstetrics and Gynecology

## 2017-07-25 ENCOUNTER — Ambulatory Visit (HOSPITAL_COMMUNITY)
Admission: RE | Admit: 2017-07-25 | Discharge: 2017-07-25 | Disposition: A | Discharge status: Home / Self Care | Payer: 59 | Source: Ambulatory Visit | Attending: Obstetrics and Gynecology | Admitting: Obstetrics and Gynecology

## 2017-07-25 ENCOUNTER — Ambulatory Visit (HOSPITAL_COMMUNITY): Payer: 59 | Admitting: Anesthesiology

## 2017-07-25 ENCOUNTER — Encounter: Payer: Self-pay | Admitting: Adult Health

## 2017-07-25 ENCOUNTER — Encounter (HOSPITAL_COMMUNITY): Payer: Self-pay | Admitting: *Deleted

## 2017-07-25 ENCOUNTER — Ambulatory Visit (HOSPITAL_COMMUNITY)
Admission: RE | Admit: 2017-07-25 | Discharge: 2017-07-25 | Disposition: A | Discharge status: Home / Self Care | Payer: 59 | Source: Ambulatory Visit | Attending: Adult Health | Admitting: Adult Health

## 2017-07-25 VITALS — BP 138/82 | HR 120 | Temp 98.0°F | Ht 63.0 in | Wt 190.0 lb

## 2017-07-25 DIAGNOSIS — F329 Major depressive disorder, single episode, unspecified: Secondary | ICD-10-CM | POA: Diagnosis not present

## 2017-07-25 DIAGNOSIS — O021 Missed abortion: Secondary | ICD-10-CM | POA: Insufficient documentation

## 2017-07-25 DIAGNOSIS — R1032 Left lower quadrant pain: Secondary | ICD-10-CM | POA: Diagnosis not present

## 2017-07-25 DIAGNOSIS — E039 Hypothyroidism, unspecified: Secondary | ICD-10-CM | POA: Insufficient documentation

## 2017-07-25 DIAGNOSIS — F4310 Post-traumatic stress disorder, unspecified: Secondary | ICD-10-CM | POA: Insufficient documentation

## 2017-07-25 DIAGNOSIS — N719 Inflammatory disease of uterus, unspecified: Secondary | ICD-10-CM

## 2017-07-25 DIAGNOSIS — Z8759 Personal history of other complications of pregnancy, childbirth and the puerperium: Secondary | ICD-10-CM | POA: Diagnosis not present

## 2017-07-25 DIAGNOSIS — R102 Pelvic and perineal pain: Secondary | ICD-10-CM

## 2017-07-25 DIAGNOSIS — O0739 Failed attempted termination of pregnancy with other complications: Secondary | ICD-10-CM | POA: Diagnosis not present

## 2017-07-25 DIAGNOSIS — M351 Other overlap syndromes: Secondary | ICD-10-CM | POA: Diagnosis not present

## 2017-07-25 DIAGNOSIS — Z79899 Other long term (current) drug therapy: Secondary | ICD-10-CM | POA: Insufficient documentation

## 2017-07-25 DIAGNOSIS — K589 Irritable bowel syndrome without diarrhea: Secondary | ICD-10-CM | POA: Insufficient documentation

## 2017-07-25 DIAGNOSIS — O034 Incomplete spontaneous abortion without complication: Secondary | ICD-10-CM | POA: Diagnosis not present

## 2017-07-25 DIAGNOSIS — N710 Acute inflammatory disease of uterus: Secondary | ICD-10-CM | POA: Diagnosis not present

## 2017-07-25 HISTORY — PX: DILATION AND CURETTAGE OF UTERUS: SHX78

## 2017-07-25 LAB — CBC
HCT: 41.9 % (ref 36.0–46.0)
Hemoglobin: 13.7 g/dL (ref 12.0–15.0)
MCH: 29.6 pg (ref 26.0–34.0)
MCHC: 32.7 g/dL (ref 30.0–36.0)
MCV: 90.5 fL (ref 78.0–100.0)
PLATELETS: 273 10*3/uL (ref 150–400)
RBC: 4.63 MIL/uL (ref 3.87–5.11)
RDW: 12.9 % (ref 11.5–15.5)
WBC: 11.8 10*3/uL — ABNORMAL HIGH (ref 4.0–10.5)

## 2017-07-25 LAB — POCT I-STAT 4, (NA,K, GLUC, HGB,HCT)
Glucose, Bld: 85 mg/dL (ref 65–99)
HEMATOCRIT: 41 % (ref 36.0–46.0)
Hemoglobin: 13.9 g/dL (ref 12.0–15.0)
Potassium: 3.5 mmol/L (ref 3.5–5.1)
Sodium: 142 mmol/L (ref 135–145)

## 2017-07-25 LAB — POCT URINE PREGNANCY: PREG TEST UR: POSITIVE — AB

## 2017-07-25 LAB — ABO/RH: ABO/RH(D): A POS

## 2017-07-25 SURGERY — DILATION AND CURETTAGE
Anesthesia: General | Site: Vagina

## 2017-07-25 MED ORDER — SODIUM CHLORIDE 0.9 % IJ SOLN
INTRAMUSCULAR | Status: AC
  Filled 2017-07-25: qty 20

## 2017-07-25 MED ORDER — 0.9 % SODIUM CHLORIDE (POUR BTL) OPTIME
TOPICAL | Status: DC | PRN
  Administered 2017-07-25: 1000 mL

## 2017-07-25 MED ORDER — GLYCOPYRROLATE 0.2 MG/ML IJ SOLN
INTRAMUSCULAR | Status: AC
  Filled 2017-07-25: qty 4

## 2017-07-25 MED ORDER — SUCCINYLCHOLINE CHLORIDE 20 MG/ML IJ SOLN
INTRAMUSCULAR | Status: AC
  Filled 2017-07-25: qty 2

## 2017-07-25 MED ORDER — SUCCINYLCHOLINE CHLORIDE 20 MG/ML IJ SOLN
INTRAMUSCULAR | Status: DC | PRN
  Administered 2017-07-25: 140 mg via INTRAVENOUS

## 2017-07-25 MED ORDER — KETOROLAC TROMETHAMINE 30 MG/ML IJ SOLN: 30.0000 mg | Freq: Once | INTRAMUSCULAR | Status: AC

## 2017-07-25 MED ORDER — LACTATED RINGERS IV SOLN
INTRAVENOUS | Status: DC | PRN
  Administered 2017-07-25 (×2): via INTRAVENOUS

## 2017-07-25 MED ORDER — MIDAZOLAM HCL 5 MG/5ML IJ SOLN
INTRAMUSCULAR | Status: DC | PRN
  Administered 2017-07-25: 2 mg via INTRAVENOUS

## 2017-07-25 MED ORDER — IBUPROFEN 600 MG PO TABS: 600.0000 mg | ORAL_TABLET | Freq: Four times a day (QID) | ORAL | 1 refills | Status: DC | PRN

## 2017-07-25 MED ORDER — ALBUTEROL SULFATE HFA 108 (90 BASE) MCG/ACT IN AERS
INHALATION_SPRAY | RESPIRATORY_TRACT | Status: AC
  Filled 2017-07-25: qty 13.4

## 2017-07-25 MED ORDER — FENTANYL CITRATE (PF) 100 MCG/2ML IJ SOLN
INTRAMUSCULAR | Status: DC | PRN
  Administered 2017-07-25 (×3): 50 ug via INTRAVENOUS

## 2017-07-25 MED ORDER — LIDOCAINE HCL (CARDIAC) 20 MG/ML IV SOLN
INTRAVENOUS | Status: DC | PRN
  Administered 2017-07-25: 30 mg via INTRAVENOUS

## 2017-07-25 MED ORDER — EPHEDRINE SULFATE 50 MG/ML IJ SOLN
INTRAMUSCULAR | Status: AC
  Filled 2017-07-25: qty 2

## 2017-07-25 MED ORDER — ATROPINE SULFATE 0.4 MG/ML IJ SOLN
INTRAMUSCULAR | Status: AC
  Filled 2017-07-25: qty 1

## 2017-07-25 MED ORDER — PROPOFOL 10 MG/ML IV BOLUS
INTRAVENOUS | Status: DC | PRN
  Administered 2017-07-25: 150 mg via INTRAVENOUS

## 2017-07-25 MED ORDER — CEFTRIAXONE SODIUM 250 MG IJ SOLR
1000.0000 mg | Freq: Once | INTRAMUSCULAR | Status: AC
  Administered 2017-07-25: 1000 mg via INTRAMUSCULAR

## 2017-07-25 MED ORDER — FENTANYL CITRATE (PF) 250 MCG/5ML IJ SOLN
INTRAMUSCULAR | Status: AC
  Filled 2017-07-25: qty 5

## 2017-07-25 MED ORDER — PROPOFOL 10 MG/ML IV BOLUS
INTRAVENOUS | Status: AC
  Filled 2017-07-25: qty 40

## 2017-07-25 MED ORDER — DOXYCYCLINE HYCLATE 100 MG PO TABS: 100.0000 mg | ORAL_TABLET | Freq: Two times a day (BID) | ORAL | 0 refills | Status: DC

## 2017-07-25 MED ORDER — OXYTOCIN 10 UNIT/ML IJ SOLN
INTRAMUSCULAR | Status: AC
  Filled 2017-07-25: qty 2

## 2017-07-25 MED ORDER — OXYTOCIN 10 UNIT/ML IJ SOLN
INTRAVENOUS | Status: DC | PRN
  Administered 2017-07-25: 20 [IU] via INTRAVENOUS

## 2017-07-25 MED ORDER — ONDANSETRON HCL 4 MG/2ML IJ SOLN
INTRAMUSCULAR | Status: AC
  Filled 2017-07-25: qty 2

## 2017-07-25 MED ORDER — MIDAZOLAM HCL 2 MG/2ML IJ SOLN
INTRAMUSCULAR | Status: AC
  Filled 2017-07-25: qty 2

## 2017-07-25 MED ORDER — CEFAZOLIN SODIUM-DEXTROSE 2-4 GM/100ML-% IV SOLN
2.0000 g | INTRAVENOUS | Status: AC
  Administered 2017-07-25: 2 g via INTRAVENOUS
  Filled 2017-07-25: qty 100

## 2017-07-25 MED ORDER — HYDROCODONE-ACETAMINOPHEN 5-325 MG PO TABS: 1.0000 | ORAL_TABLET | Freq: Four times a day (QID) | ORAL | 0 refills | Status: DC | PRN

## 2017-07-25 MED ORDER — PHENYLEPHRINE 40 MCG/ML (10ML) SYRINGE FOR IV PUSH (FOR BLOOD PRESSURE SUPPORT)
PREFILLED_SYRINGE | INTRAVENOUS | Status: AC
  Filled 2017-07-25: qty 10

## 2017-07-25 MED ORDER — DEXAMETHASONE SODIUM PHOSPHATE 4 MG/ML IJ SOLN
INTRAMUSCULAR | Status: AC
  Filled 2017-07-25: qty 1

## 2017-07-25 MED ORDER — KETOROLAC TROMETHAMINE 30 MG/ML IJ SOLN
INTRAMUSCULAR | Status: AC
  Filled 2017-07-25: qty 1

## 2017-07-25 MED ORDER — ROCURONIUM BROMIDE 50 MG/5ML IV SOLN
INTRAVENOUS | Status: AC
  Filled 2017-07-25: qty 1

## 2017-07-25 MED ORDER — METRONIDAZOLE 500 MG PO TABS
500.0000 mg | ORAL_TABLET | Freq: Two times a day (BID) | ORAL | 0 refills | Status: DC
Start: 2017-07-25 — End: 2017-09-01

## 2017-07-25 MED ORDER — SEVOFLURANE IN SOLN
RESPIRATORY_TRACT | Status: AC
  Filled 2017-07-25: qty 250

## 2017-07-25 MED ORDER — ONDANSETRON HCL 4 MG/2ML IJ SOLN
INTRAMUSCULAR | Status: DC | PRN
  Administered 2017-07-25: 4 mg via INTRAVENOUS

## 2017-07-25 SURGICAL SUPPLY — 24 items
BAG HAMPER (MISCELLANEOUS) ×2 IMPLANT
CLOTH BEACON ORANGE TIMEOUT ST (SAFETY) ×2 IMPLANT
COVER LIGHT HANDLE STERIS (MISCELLANEOUS) ×4 IMPLANT
COVER MAYO STAND XLG (DRAPE) ×2 IMPLANT
FORMALIN 10 PREFIL 480ML (MISCELLANEOUS) ×2 IMPLANT
GAUZE SPONGE 4X4 16PLY XRAY LF (GAUZE/BANDAGES/DRESSINGS) ×2 IMPLANT
GLOVE BIOGEL PI IND STRL 7.0 (GLOVE) ×2 IMPLANT
GLOVE BIOGEL PI IND STRL 9 (GLOVE) ×1 IMPLANT
GLOVE BIOGEL PI INDICATOR 7.0 (GLOVE) ×2
GLOVE BIOGEL PI INDICATOR 9 (GLOVE) ×1
GLOVE ECLIPSE 9.0 STRL (GLOVE) ×2 IMPLANT
GOWN SPEC L3 XXLG W/TWL (GOWN DISPOSABLE) ×2 IMPLANT
GOWN STRL REUS W/TWL LRG LVL3 (GOWN DISPOSABLE) ×2 IMPLANT
KIT BERKELEY 1ST TRIMESTER 3/8 (MISCELLANEOUS) ×2 IMPLANT
KIT ROOM TURNOVER AP CYSTO (KITS) ×2 IMPLANT
MARKER SKIN DUAL TIP RULER LAB (MISCELLANEOUS) ×2 IMPLANT
NEEDLE SPNL 22GX3.5 QUINCKE BK (NEEDLE) ×2 IMPLANT
NS IRRIG 1000ML POUR BTL (IV SOLUTION) ×2 IMPLANT
PACK BASIC III (CUSTOM PROCEDURE TRAY) ×1
PACK SRG BSC III STRL LF ECLPS (CUSTOM PROCEDURE TRAY) ×1 IMPLANT
PAD ARMBOARD 7.5X6 YLW CONV (MISCELLANEOUS) ×2 IMPLANT
SET BERKELEY SUCTION TUBING (SUCTIONS) ×2 IMPLANT
TOWEL OR 17X26 4PK STRL BLUE (TOWEL DISPOSABLE) ×2 IMPLANT
VACURETTE 10MM (CANNULA) ×2 IMPLANT

## 2017-07-25 NOTE — Patient Instructions (Signed)
Go pain continues go to ER

## 2017-07-25 NOTE — Transfer of Care (Signed)
Immediate Anesthesia Transfer of Care Note  Patient: Marisa Johnson  Procedure(s) Performed: SUCTION DILATATION AND CURETTAGE (N/A Vagina )  Patient Location: PACU  Anesthesia Type:General  Level of Consciousness: awake, alert , oriented and patient cooperative  Airway & Oxygen Therapy: Patient Spontanous Breathing and Patient connected to nasal cannula oxygen  Post-op Assessment: Report given to RN and Post -op Vital signs reviewed and stable  Post vital signs: Reviewed and stable  Last Vitals:  Vitals:   07/25/17 1446  BP: 125/85  Pulse: 94  Resp: 18  Temp: 36.6 C  SpO2: 96%    Last Pain:  Vitals:   07/25/17 1446  TempSrc: Oral  PainSc: 7       Patients Stated Pain Goal: 6 (07/25/17 1446)  Complications: No apparent anesthesia complications

## 2017-07-25 NOTE — Anesthesia Postprocedure Evaluation (Signed)
Anesthesia Post Note  Patient: Marisa Johnson  Procedure(s) Performed: SUCTION DILATATION AND CURETTAGE (N/A Vagina )  Patient location during evaluation: PACU Anesthesia Type: General Level of consciousness: awake and alert and oriented Pain management: pain level controlled Vital Signs Assessment: post-procedure vital signs reviewed and stable Respiratory status: spontaneous breathing and respiratory function stable Cardiovascular status: stable Postop Assessment: no apparent nausea or vomiting Anesthetic complications: no     Last Vitals:  Vitals:   07/25/17 1446  BP: 125/85  Pulse: 94  Resp: 18  Temp: 36.6 C  SpO2: 96%    Last Pain:  Vitals:   07/25/17 1446  TempSrc: Oral  PainSc: 7                  Allison Silva A

## 2017-07-25 NOTE — Brief Op Note (Signed)
07/25/2017  3:50 PM  PATIENT:  Lawson Fiscal A Hutchens  41 y.o. female  PRE-OPERATIVE DIAGNOSIS:  Retained products of conception ( missed AB)  POST-OPERATIVE DIAGNOSIS:  Retained products of conception (missed AB)  PROCEDURE:  Procedure(s): SUCTION DILATATION AND CURETTAGE (N/A)  SURGEON:  Surgeon(s) and Role:    * Tilda Burrow, MD - Primary  PHYSICIAN ASSISTANT:   ASSISTANTS: none Angela witt CST  ANESTHESIA:   general  EBL:  Total I/O In: 400 [I.V.:400] Out: -   BLOOD ADMINISTERED:none  DRAINS: none   LOCAL MEDICATIONS USED:  NONE  SPECIMEN:  Source of Specimen:  Products of conception  DISPOSITION OF SPECIMEN:  PATHOLOGY  COUNTS:  YES  TOURNIQUET:  * No tourniquets in log *  DICTATION: .Dragon Dictation  PLAN OF CARE: Discharge to home after PACU  PATIENT DISPOSITION:  PACU - hemodynamically stable.   Delay start of Pharmacological VTE agent (>24hrs) due to surgical blood loss or risk of bleeding: not applicable Details of procedure: Patient was taken to the operating room prepped and draped for vaginal procedure with timeout conducted and Ancef administered 2 g and general endotracheal intubation achieved. Speculum was inserted cervix grasped in the anterior lip with single-tooth tenaculum after bimanual exam revealed a retroverted uterus approximately 8-10 weeks size uterus was sounded in the retroverted position to 11 cm and dilated to 31 [place] allowing introduction of a 10 mm curved suction curette which removed fluid tissue and blood consistent with missed AB. Smooth sharp curettage in all 4 quadrants confirm satisfactory uterine evacuation and then procedure considered satisfactorily completed. IV oxytocin was infusing. Patient recovery room in stable condition. Less than 50 cc

## 2017-07-25 NOTE — Anesthesia Procedure Notes (Signed)
Procedure Name: Intubation Date/Time: 07/25/2017 3:24 PM Performed by: Pernell Dupre, AMY A Pre-anesthesia Checklist: Patient identified, Patient being monitored, Timeout performed, Emergency Drugs available and Suction available Patient Re-evaluated:Patient Re-evaluated prior to induction Oxygen Delivery Method: Circle System Utilized Preoxygenation: Pre-oxygenation with 100% oxygen Induction Type: IV induction, Rapid sequence and Cricoid Pressure applied Ventilation: Mask ventilation without difficulty Laryngoscope Size: Miller and 3 Grade View: Grade I Tube type: Oral Tube size: 7.0 mm Number of attempts: 1 Airway Equipment and Method: Stylet Placement Confirmation: ETT inserted through vocal cords under direct vision,  positive ETCO2 and breath sounds checked- equal and bilateral Secured at: 21 cm Tube secured with: Tape Dental Injury: Teeth and Oropharynx as per pre-operative assessment

## 2017-07-25 NOTE — Discharge Instructions (Signed)

## 2017-07-25 NOTE — Op Note (Signed)
Please see the brief operative note for surgical details 

## 2017-07-25 NOTE — Anesthesia Preprocedure Evaluation (Signed)
Anesthesia Evaluation  Patient identified by MRN, date of birth, ID band Patient awake    Airway Mallampati: I  TM Distance: >3 FB Neck ROM: Full    Dental no notable dental hx. (+) Teeth Intact   Pulmonary    breath sounds clear to auscultation       Cardiovascular  Rhythm:Regular Rate:Normal     Neuro/Psych  Headaches, Anxiety Depression    GI/Hepatic   Endo/Other  Hypothyroidism   Renal/GU      Musculoskeletal   Abdominal   Peds  Hematology   Anesthesia Other Findings   Reproductive/Obstetrics Missed abortion                             Anesthesia Physical Anesthesia Plan  ASA: II and emergent  Anesthesia Plan: General   Post-op Pain Management:    Induction: Intravenous and Rapid sequence  PONV Risk Score and Plan: 3 and Ondansetron, Dexamethasone, Midazolam and Propofol infusion  Airway Management Planned: Oral ETT  Additional Equipment:   Intra-op Plan:   Post-operative Plan: Extubation in OR  Informed Consent:   Dental advisory given  Plan Discussed with: Surgeon  Anesthesia Plan Comments:         Anesthesia Quick Evaluation

## 2017-07-25 NOTE — H&P (Signed)
Marisa Johnson is an 41 y.o. female. She is a [id number] with u/s confirmed missed ab. She is cramping, uncomfortable and is here for suction dilation and curettage for missed abortion.  Pertinent Gynecological History: Menses:  Bleeding:  Contraception: none DES exposure: unknown Blood transfusions: none Sexually transmitted diseases: no past history and was given rocephin this am due to level of discomfort. Previous GYN Procedures:   Last mammogram:  Date:  Last pap:  Date:  OB History: G5, P3023   Menstrual History: Menarche age:  No LMP recorded.    Past Medical History:  Diagnosis Date  . Anxiety   . Carpal tunnel syndrome   . Contraceptive management 08/09/2015  . Decreased libido 08/09/2015  . Friable cervix 08/09/2015  . History of abnormal cervical Pap smear 08/09/2015  . IBS (irritable bowel syndrome)   . Irregular intermenstrual bleeding 08/09/2015  . Major depressive disorder   . Migraine   . Mixed connective tissue disease (HCC)   . Pseudobulbar affect   . PTSD (post-traumatic stress disorder)   . Thyroid disease   . Vaginal Pap smear, abnormal     Past Surgical History:  Procedure Laterality Date  . ESOPHAGOGASTRODUODENOSCOPY N/A 11/17/2016   Procedure: ESOPHAGOGASTRODUODENOSCOPY (EGD);  Surgeon: Malissa Hippo, MD;  Location: [facility];  Service: Endoscopy;  Laterality: N/A;  . None      Family History  Problem Relation Age of Onset  . Diabetes Mother   . Hypertension Mother   . Hypertension Father   . Stroke Father   . Cancer Father        prostate  . Cancer Maternal Grandmother   . Kidney disease Paternal Grandmother   . Hypertension Paternal Grandmother   . Hyperlipidemia Paternal Grandmother   . Heart disease Paternal Grandmother   . Heart attack Paternal Grandmother   . Diabetes Daughter   . Hypothyroidism Daughter   . Anxiety disorder Sister   . Anxiety disorder Brother   . Anxiety disorder Sister   . Colon cancer Neg Hx      Social History:  reports that she has never smoked. She has never used smokeless tobacco. She reports that she drinks alcohol. She reports that she does not use drugs.  Allergies:  Allergies  Allergen Reactions  . Abilify [Aripiprazole] Other (See Comments)    Reaction:  Restless leg syndrome   . Reglan [Metoclopramide] Other (See Comments)    Reaction:  Hallucinations   . Dexamethasone Rash    Prescriptions Prior to Admission  Medication Sig Dispense Refill Last Dose  . buPROPion (WELLBUTRIN XL) 300 MG 24 hr tablet Take 300 mg by mouth at bedtime.   07/24/2017 at Unknown time  . clonazePAM (KLONOPIN) 0.5 MG tablet Take 0.5 mg by mouth 2 (two) times daily.    07/24/2017 at Unknown time  . HYDROcodone-acetaminophen (NORCO/VICODIN) 5-325 MG tablet Take 1 tablet by mouth every 6 (six) hours as needed for moderate pain. 30 tablet 0 07/24/2017 at Unknown time  . lamoTRIgine (LAMICTAL) 200 MG tablet Take 300 mg by mouth at bedtime.    07/24/2017 at Unknown time  . levothyroxine (SYNTHROID, LEVOTHROID) 150 MCG tablet Take 1 tablet by mouth daily.   07/24/2017 at Unknown time  . ondansetron (ZOFRAN) 4 MG tablet Take 1 tablet (4 mg total) by mouth every 8 (eight) hours as needed for nausea or vomiting. 20 tablet 1 07/24/2017 at Unknown time  . predniSONE (DELTASONE) 20 MG tablet Two daily with food 10 tablet 0  Past Week at Unknown time  . traMADol (ULTRAM) 50 MG tablet Take 50-100 mg by mouth every 6 (six) hours as needed for moderate pain.    07/24/2017 at Unknown time  . traZODone (DESYREL) 100 MG tablet Take 100 mg by mouth at bedtime.   07/24/2017 at Unknown time  . albuterol (PROVENTIL HFA;VENTOLIN HFA) 108 (90 Base) MCG/ACT inhaler Inhale 2 puffs into the lungs every 6 (six) hours as needed for wheezing or shortness of breath.   More than a month at Unknown time  . doxycycline (VIBRA-TABS) 100 MG tablet Take 1 tablet (100 mg total) by mouth 2 (two) times daily. 28 tablet 0 Unknown at  Unknown time  . fluticasone furoate-vilanterol (BREO ELLIPTA) 200-25 MCG/INH AEPB Inhale 1 puff into the lungs daily.   More than a month at Unknown time  . meclizine (ANTIVERT) 25 MG tablet Take 25 mg by mouth 3 (three) times daily as needed for dizziness.   Taking  . metroNIDAZOLE (FLAGYL) 500 MG tablet Take 1 tablet (500 mg total) by mouth 2 (two) times daily. 28 tablet 0 Unknown at Unknown time    ROS  There were no vitals taken for this visit. Physical Exam  Constitutional: She is oriented to person, place, and time. She appears well-developed and well-nourished.  HENT:  Head: Normocephalic.  Eyes: Pupils are equal, round, and reactive to light.  Neck: Normal range of motion.  Cardiovascular: Normal rate.   Respiratory: Effort normal.  GI: Soft.  Genitourinary: Vagina normal.  Neurological: She is alert and oriented to person, place, and time. She has normal reflexes.  Skin: Skin is warm and dry.  Psychiatric: She has a normal mood and affect. Her behavior is normal. Judgment and thought content normal.    Results for orders placed or performed in visit on 07/25/17 (from the past 24 hour(s))  POCT urine pregnancy     Status: Abnormal   Collection Time: 07/25/17  2:10 PM  Result Value Ref Range   Preg Test, Ur Positive (A) Negative    US Pelvis Transvanginal Non-ob (tv Only)  Result Date: 07/25/2017 CLINICAL DATA:  Left lower quadrant pain for 3 weeks intermittently. Status post chemical abortion on 05/29/2017. EXAM: TRANSABDOMINAL AND TRANSVAGINAL ULTRASOUND OF PELVIS TECHNIQUE: Both transabdominal and transvaginal ultrasound examinations of the pelvis were performed. Transabdominal technique was performed for global imaging of the pelvis including uterus, ovaries, adnexal regions, and pelvic cul-de-sac. It was necessary to proceed with endovaginal exam following the transabdominal exam to visualize the uterus, endometrium and ovaries/adnexa to better advantage. COMPARISON:   None FINDINGS: Uterus Measurements: 9.1 x 4.7 x 5.8 cm. No uterine masses. Cervix is closed. Endometrium Thickness: 22 mm. The endometrium itself appears relatively thin. Endometrial canal is distended by an elongated fluid collection. Within this fluid collection is an apparent fetal pole,. There was no detectable fetal heart activity. Fetal pole measures 9.1 mm, which is consistent with 7.[redacted] weeks gestation. Right ovary Measurements: 2.6 x 1.5 x 1.5 cm. Normal appearance/no adnexal mass. Left ovary Measurements: 3.0 x 2.4 x 1.5 cm. Normal appearance/no adnexal mass. Other findings No abnormal free fluid. IMPRESSION: 1. Elongated fluid collection distends the endometrial canal, which is likely a residual gestational sac. This contains an embryo with no detectable fetal heart activity. Embryo measures 9.1 mm, crown-rump length. 2. Cervix appears closed. No uterine masses. Normal ovaries and adnexa. Electronically Signed   By: Amie Portland M.D.   On: 07/25/2017 13:46   US Pelvis (transabdominal Only)  Result Date: 07/25/2017 CLINICAL DATA:  Left lower quadrant pain for 3 weeks intermittently. Status post chemical abortion on 05/29/2017. EXAM: TRANSABDOMINAL AND TRANSVAGINAL ULTRASOUND OF PELVIS TECHNIQUE: Both transabdominal and transvaginal ultrasound examinations of the pelvis were performed. Transabdominal technique was performed for global imaging of the pelvis including uterus, ovaries, adnexal regions, and pelvic cul-de-sac. It was necessary to proceed with endovaginal exam following the transabdominal exam to visualize the uterus, endometrium and ovaries/adnexa to better advantage. COMPARISON:  None FINDINGS: Uterus Measurements: 9.1 x 4.7 x 5.8 cm. No uterine masses. Cervix is closed. Endometrium Thickness: 22 mm. The endometrium itself appears relatively thin. Endometrial canal is distended by an elongated fluid collection. Within this fluid collection is an apparent fetal pole,. There was no detectable  fetal heart activity. Fetal pole measures 9.1 mm, which is consistent with 7.[redacted] weeks gestation. Right ovary Measurements: 2.6 x 1.5 x 1.5 cm. Normal appearance/no adnexal mass. Left ovary Measurements: 3.0 x 2.4 x 1.5 cm. Normal appearance/no adnexal mass. Other findings No abnormal free fluid. IMPRESSION: 1. Elongated fluid collection distends the endometrial canal, which is likely a residual gestational sac. This contains an embryo with no detectable fetal heart activity. Embryo measures 9.1 mm, crown-rump length. 2. Cervix appears closed. No uterine masses. Normal ovaries and adnexa. Electronically Signed   By: Amie Portland M.D.   On: 07/25/2017 13:46    Assessment/Plan: Missed abortion,  Plan:  Suction Dilation and curettage.  Altamese Deguire V 07/25/2017, 2:29 PM

## 2017-07-25 NOTE — Progress Notes (Signed)
Pt given Rocephin  IM right VG without complications.

## 2017-07-25 NOTE — Progress Notes (Signed)
Subjective:     Patient ID: Marisa Johnson, female   DOB: 1975-11-13, 41 y.o.   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 She said she had blleding that follow ing weekend and cramping and has had pain and spotting since, has not followed HCGs as she should and today pain is worse. Her mom says blood type is A+.  Review of Systems Pelvic pain Nausea Body aches at times and low grade temp  Reviewed past medical,surgical, social and family history. Reviewed medications and allergies.     Objective:   Physical Exam BP 138/82 (BP Location: Right Arm, Patient Position: Sitting, Cuff Size: Normal)   Pulse (!) 120   Temp 98 F (36.7 C)   Ht _0  (1.6 m)   Wt 190 lb (86.2 kg)   BMI 33.66 kg/m UPT litely + Skin warm and dry.Pelvic: external genitalia is normal in appearance no lesions, vagina: scant discharge without odor,urethra has no lesions or masses noted, cervix:has tissue at os, +CMT,uterus: normal size, shape and contour, + tender, no masses felt, adnexa: no masses or tenderness noted. Bladder is non tender and no masses felt. GC/CHL obtained.    Will get [place] stat and labs and gave rocephin 1 gm n office and talk after [place]. Spoke with radiology and pt has elongated fluid sac from endometrium to cervix and fetal pole with no [place] of about 7.4 weeks, discussed with Dr Glo Herring and he will schedule suction D&C for today, and pt is aware.She last ate at about 7:30 this am.   Assessment:     1. Pelvic pain   2. Endometritis   3.      History of abortion     Plan:    Tissue sent to path GC/CHL sent Check CBC, ESR and QHCG Go to [facility] [address] for stat pelvic US now F/U with me in 3 days Meds ordered this encounter  Medications  . doxycycline (VIBRA-TABS) 100 MG tablet    Sig: Take 1 tablet (100 mg total) by mouth 2 (two) times daily.    Dispense:  28  tablet    Refill:  0    Order Specific Question:   Supervising Provider    Answer:   Elonda Husky, LUTHER H [[id number]]  . metroNIDAZOLE (FLAGYL) 500 MG tablet    Sig: Take 1 tablet (500 mg total) by mouth 2 (two) times daily.    Dispense:  28 tablet    Refill:  0    Order Specific Question:   Supervising Provider    Answer:   Elonda Husky, LUTHER H [[id number]]  . HYDROcodone-acetaminophen (NORCO/VICODIN) 5-325 MG tablet    Sig: Take 1 tablet by mouth every 6 (six) hours as needed for moderate pain.    Dispense:  30 tablet    Refill:  0    Order Specific Question:   Supervising Provider    Answer:   Elonda Husky, LUTHER H [[id number]]  . cefTRIAXone (ROCEPHIN) injection 1,000 mg

## 2017-07-27 LAB — CBC WITH DIFFERENTIAL/PLATELET
BASOS ABS: 0 10*3/uL (ref 0.0–0.2)
BASOS: 0 %
EOS (ABSOLUTE): 0.2 10*3/uL (ref 0.0–0.4)
Eos: 1 %
HEMOGLOBIN: 13.5 g/dL (ref 11.1–15.9)
Hematocrit: 42.4 % (ref 34.0–46.6)
IMMATURE GRANS (ABS): 0 10*3/uL (ref 0.0–0.1)
Immature Granulocytes: 0 %
LYMPHS: 37 %
Lymphocytes Absolute: 4.6 10*3/uL — ABNORMAL HIGH (ref 0.7–3.1)
MCH: 29 pg (ref 26.6–33.0)
MCHC: 31.8 g/dL (ref 31.5–35.7)
MCV: 91 fL (ref 79–97)
MONOCYTES: 6 %
Monocytes Absolute: 0.7 10*3/uL (ref 0.1–0.9)
NEUTROS ABS: 6.9 10*3/uL (ref 1.4–7.0)
Neutrophils: 56 %
Platelets: 300 10*3/uL (ref 150–379)
RBC: 4.66 x10E6/uL (ref 3.77–5.28)
RDW: 13.2 % (ref 12.3–15.4)
WBC: 12.5 10*3/uL — ABNORMAL HIGH (ref 3.4–10.8)

## 2017-07-27 LAB — GC/CHLAMYDIA PROBE AMP
CHLAMYDIA, DNA PROBE: NEGATIVE
Neisseria gonorrhoeae by PCR: NEGATIVE

## 2017-07-27 LAB — SEDIMENTATION RATE: Sed Rate: 2 mm/hr (ref 0–32)

## 2017-07-27 LAB — BETA HCG QUANT (REF LAB): hCG Quant: 39 m[IU]/mL

## 2017-07-28 ENCOUNTER — Ambulatory Visit (INDEPENDENT_AMBULATORY_CARE_PROVIDER_SITE_OTHER): Payer: 59 | Admitting: Adult Health

## 2017-07-28 ENCOUNTER — Encounter: Payer: Self-pay | Admitting: Adult Health

## 2017-07-28 VITALS — BP 110/60 | HR 87 | Resp 21 | Ht 63.0 in | Wt 191.0 lb

## 2017-07-28 DIAGNOSIS — Z8759 Personal history of other complications of pregnancy, childbirth and the puerperium: Secondary | ICD-10-CM

## 2017-07-28 DIAGNOSIS — Z9889 Other specified postprocedural states: Secondary | ICD-10-CM

## 2017-07-28 NOTE — Patient Instructions (Signed)
F/U in 2 weeks

## 2017-07-28 NOTE — Progress Notes (Signed)
Subjective:     Patient ID: Marisa Johnson, female   DOB: 1975-12-14, 41 y.o.   MRN: [id number]  HPI Marisa Johnson is a 41 year old white female, back in follow up of having had D&C 10/12 for retrained products after abortion, and she feels much better.   Review of Systems Still has some low back to rectal discomfort. Reviewed past medical,surgical, social and family history. Reviewed medications and allergies.     Objective:   Physical Exam BP 110/60 (BP Location: Left Arm, Patient Position: Sitting, Cuff Size: Normal)   Pulse 87   Resp (!) 21   Ht  (1.6 m)   Wt 191 lb (86.6 kg)   SpO2 99%   BMI 33.83 kg/m  Skin warm and dry. Lungs: clear to ausculation bilaterally. Cardiovascular: regular rate and rhythm.   Abdomen is soft and non tender.  Assessment:     History of abortion, with retained products, D&C     Plan:     F/U in 2 weeks with Dr Emelda Fear Not given to return to work 07/30/17.

## 2017-07-30 ENCOUNTER — Telehealth: Payer: Self-pay | Admitting: Adult Health

## 2017-07-30 ENCOUNTER — Ambulatory Visit: Payer: 59 | Admitting: Adult Health

## 2017-07-30 NOTE — Telephone Encounter (Signed)
Patient requesting 10/12-10/17 out for FMLA due to recent procedure and wanted to inform us. Will get forms completed and sent in.

## 2017-07-30 NOTE — Telephone Encounter (Signed)
Patient called stating that she would like a call back from GardenaJennifer or her nurse, because she seen Victorino DikeJennifer recently and she gave her phone numbers and information and lost that. Please contact pt

## 2017-08-05 DIAGNOSIS — M797 Fibromyalgia: Secondary | ICD-10-CM | POA: Diagnosis not present

## 2017-08-05 DIAGNOSIS — G609 Hereditary and idiopathic neuropathy, unspecified: Secondary | ICD-10-CM | POA: Diagnosis not present

## 2017-08-05 DIAGNOSIS — R5383 Other fatigue: Secondary | ICD-10-CM | POA: Diagnosis not present

## 2017-08-05 DIAGNOSIS — R269 Unspecified abnormalities of gait and mobility: Secondary | ICD-10-CM | POA: Diagnosis not present

## 2017-08-11 DIAGNOSIS — Z029 Encounter for administrative examinations, unspecified: Secondary | ICD-10-CM

## 2017-08-13 ENCOUNTER — Ambulatory Visit: Payer: 59 | Admitting: Obstetrics and Gynecology

## 2017-08-20 ENCOUNTER — Ambulatory Visit: Payer: 59 | Admitting: Obstetrics and Gynecology

## 2017-08-20 ENCOUNTER — Encounter: Payer: Self-pay | Admitting: Obstetrics and Gynecology

## 2017-08-20 DIAGNOSIS — Z3202 Encounter for pregnancy test, result negative: Secondary | ICD-10-CM | POA: Diagnosis not present

## 2017-08-20 DIAGNOSIS — Z9889 Other specified postprocedural states: Secondary | ICD-10-CM

## 2017-08-20 LAB — POCT URINE PREGNANCY: PREG TEST UR: NEGATIVE

## 2017-08-20 NOTE — Progress Notes (Signed)
Patient ID: Marisa Johnson, female   DOB: 1976-07-29, 41 y.o.   MRN: [id number]    Subjective:  Marisa Johnson is a 41 y.o. female now 4 weeks status post D&C. Pt reports no complaints regarding her procedure. However, she would like to discuss contraception management. She is nervous about getting an IUD because of previous health issues. She adds that she has been experiencing heavier menstrual cycles lately, with the last starting this past Friday, 08/15/2017 and it stopped yesterday, 08/19/2017. Pt adds that she felt weak from all of the blood loss.    Review of Systems Negative    Diet:   normal   Bowel movements : normal.  The patient is not having any pain.  Objective:  BP 110/70   Pulse 96   Wt 191 lb (86.6 kg)   BMI 33.83 kg/m  General:Well developed, well nourished.  No acute distress. Abdomen: Bowel sounds normal, soft, non-tender. Pelvic Exam:    External Genitalia:  Normal.    Vagina: Normal    Cervix: Multiparous, first degree uterine descensus     Uterus: Pain with palpation    Adnexa/Bimanual: Normal  Incision(s):   Healing well, no drainage, no erythema, no hernia, no swelling, no dehiscence,   Discussion: 1. Discussed with pt benefits of an IUD, for contraception management  At end of discussion, pt had opportunity to ask questions and has no further questions at this time.   Specific discussion of contraception management as noted above. Greater than 50% was spent in counseling and coordination of care with the patient.   Total time greater than: 25 minutes.    Assessment:  1. Post-Op 4 weeks s/p D&C  2. Healing well postoperatively. 3. Multiparous cervix 4. 1st degree uterine descensus Plan:  1.Wound care discussed   2. . current medications. 3. Activity restrictions: none 4. return to work: not applicable. 5. Follow up in 1 week for IUD insertion.  By signing my name below, I, Diona BrownerJennifer Gorman, attest that this documentation has been prepared under  the direction and in the presence of Tilda BurrowFerguson, Adesuwa Osgood V, MD. Electronically Signed: Diona BrownerJennifer Gorman, Medical Scribe. 08/20/17. 4:07 PM.  I personally performed the services described in this documentation, which was SCRIBED in my presence. The recorded information has been reviewed and considered accurate. It has been edited as necessary during review. Tilda BurrowFERGUSON,Hamlet Lasecki V, MD

## 2017-08-21 DIAGNOSIS — M5416 Radiculopathy, lumbar region: Secondary | ICD-10-CM | POA: Diagnosis not present

## 2017-08-21 DIAGNOSIS — Z6833 Body mass index (BMI) 33.0-33.9, adult: Secondary | ICD-10-CM | POA: Diagnosis not present

## 2017-08-26 ENCOUNTER — Encounter (INDEPENDENT_AMBULATORY_CARE_PROVIDER_SITE_OTHER): Payer: Self-pay | Admitting: Internal Medicine

## 2017-08-26 ENCOUNTER — Ambulatory Visit (INDEPENDENT_AMBULATORY_CARE_PROVIDER_SITE_OTHER): Payer: Self-pay | Admitting: Internal Medicine

## 2017-08-27 ENCOUNTER — Ambulatory Visit: Payer: 59 | Admitting: Obstetrics and Gynecology

## 2017-09-01 ENCOUNTER — Ambulatory Visit (INDEPENDENT_AMBULATORY_CARE_PROVIDER_SITE_OTHER): Payer: 59 | Admitting: Internal Medicine

## 2017-09-01 ENCOUNTER — Encounter (INDEPENDENT_AMBULATORY_CARE_PROVIDER_SITE_OTHER): Payer: Self-pay | Admitting: Internal Medicine

## 2017-09-01 ENCOUNTER — Encounter: Payer: Self-pay | Admitting: Obstetrics and Gynecology

## 2017-09-01 VITALS — BP 140/70 | HR 64 | Temp 97.6°F | Ht 63.0 in | Wt 187.7 lb

## 2017-09-01 DIAGNOSIS — K5909 Other constipation: Secondary | ICD-10-CM

## 2017-09-01 MED ORDER — LINACLOTIDE 145 MCG PO CAPS: 145.0000 ug | ORAL_CAPSULE | Freq: Every day | ORAL | 3 refills | Status: DC

## 2017-09-01 NOTE — Patient Instructions (Signed)
Rx for LInzess sent to her pharmacy.

## 2017-09-01 NOTE — Progress Notes (Signed)
Subjective:    Patient ID: Marisa DubinLori A Demarais, female    DOB: 08-27-1976, 41 y.o.   MRN: [id number]  HPI Presents today with c/o constipation. She says she is always constipated. She usually has a BM every 4th day. Once she had a BM after 14 days. Has been on Linzess 290mcg which was too high. The medication did work but gave her diarrhea. Her appetite is good. No weight loss.  Her back will hurt when she is constipated.     11/17/2016 EGD: FB in esophagus.  Impression:               - Fluid in the lower third of the esophagus.                           - Normal esophagus. Biopsied.                           - Z-line regular, 35 cm from the incisors.                           - A medium amount of food (residue) in the stomach.                           - Normal duodenal bulb and second portion of the                            duodenum.  Biopsy does not show changes of EoE  Review of Systems Past Medical History:  Diagnosis Date  . Anxiety   . Carpal tunnel syndrome   . Contraceptive management 08/09/2015  . Decreased libido 08/09/2015  . Friable cervix 08/09/2015  . History of abnormal cervical Pap smear 08/09/2015  . IBS (irritable bowel syndrome)   . Irregular intermenstrual bleeding 08/09/2015  . Major depressive disorder   . Migraine   . Mixed connective tissue disease (HCC)   . Pseudobulbar affect   . PTSD (post-traumatic stress disorder)   . Thyroid disease   . Vaginal Pap smear, abnormal     Past Surgical History:  Procedure Laterality Date  . ESOPHAGOGASTRODUODENOSCOPY (EGD) N/A 11/17/2016   Performed by Malissa Hippoehman, Najeeb U, MD at [facility]  . None    . SUCTION DILATATION AND CURETTAGE N/A 07/25/2017   Performed by Tilda BurrowFerguson, John V, MD at [facility]    Allergies  Allergen Reactions  . Abilify [Aripiprazole] Other (See Comments)    Reaction:  Restless leg syndrome   . Reglan [Metoclopramide] Other (See Comments)    Reaction:  Hallucinations   . Dexamethasone Rash     Current Outpatient Medications on File Prior to Visit  Medication Sig Dispense Refill  . albuterol (PROVENTIL HFA;VENTOLIN HFA) 108 (90 Base) MCG/ACT inhaler Inhale 2 puffs into the lungs every 6 (six) hours as needed for wheezing or shortness of breath.    Marland Kitchen. buPROPion (WELLBUTRIN XL) 300 MG 24 hr tablet Take 300 mg by mouth at bedtime.    . clonazePAM (KLONOPIN) 0.5 MG tablet Take 0.5 mg by mouth 2 (two) times daily.     Marland Kitchen. HYDROcodone-acetaminophen (NORCO/VICODIN) 5-325 MG tablet Take 1 tablet by mouth every 6 (six) hours as needed for moderate pain. 30 tablet 0  . ibuprofen (ADVIL,MOTRIN) 600 MG tablet Take 1  tablet (600 mg total) by mouth every 6 (six) hours as needed. 30 tablet 1  . lamoTRIgine (LAMICTAL) 200 MG tablet Take 300 mg by mouth at bedtime.     Marland Kitchen. levothyroxine (SYNTHROID, LEVOTHROID) 150 MCG tablet Take 1 tablet by mouth daily.    . meclizine (ANTIVERT) 25 MG tablet Take 25 mg by mouth 3 (three) times daily as needed for dizziness.    . ondansetron (ZOFRAN) 4 MG tablet Take 1 tablet (4 mg total) by mouth every 8 (eight) hours as needed for nausea or vomiting. 20 tablet 1  . traMADol (ULTRAM) 50 MG tablet Take 50-100 mg by mouth every 6 (six) hours as needed for moderate pain.     . traZODone (DESYREL) 100 MG tablet Take 100 mg by mouth at bedtime.     No current facility-administered medications on file prior to visit.         Objective:   Physical Exam Blood pressure 140/70, pulse 64, temperature 97.6 F (36.4 C), height 5\' 3"  (1.6 m), weight 187 lb 11.2 oz (85.1 kg). Alert and oriented. Skin warm and dry. Oral mucosa is moist.   . Sclera anicteric, conjunctivae is pink. Thyroid not enlarged. No cervical lymphadenopathy. Lungs clear. Heart regular rate and rhythm.  Abdomen is soft. Bowel sounds are positive. No hepatomegaly. No abdominal masses felt. No tenderness.  No edema to lower extremities.         Assessment & Plan:  Constipation.  Am going to start her on  Linzess. 145mcg. Samples x 3 boxes given and Rx sent to her pharmacy.

## 2017-09-02 DIAGNOSIS — F3181 Bipolar II disorder: Secondary | ICD-10-CM | POA: Diagnosis not present

## 2017-09-11 ENCOUNTER — Other Ambulatory Visit: Payer: Self-pay

## 2017-09-11 ENCOUNTER — Ambulatory Visit: Payer: 59 | Admitting: Obstetrics and Gynecology

## 2017-09-11 ENCOUNTER — Ambulatory Visit: Payer: 59 | Admitting: Adult Health

## 2017-09-11 ENCOUNTER — Encounter: Payer: Self-pay | Admitting: Obstetrics and Gynecology

## 2017-09-11 VITALS — BP 104/66 | HR 82 | Ht 63.5 in | Wt 184.0 lb

## 2017-09-11 DIAGNOSIS — Z302 Encounter for sterilization: Secondary | ICD-10-CM

## 2017-09-11 DIAGNOSIS — Z3009 Encounter for other general counseling and advice on contraception: Secondary | ICD-10-CM

## 2017-09-11 DIAGNOSIS — N946 Dysmenorrhea, unspecified: Secondary | ICD-10-CM | POA: Diagnosis not present

## 2017-09-11 NOTE — Progress Notes (Addendum)
[facility] Visit  09/11/2017            Patient name: Marisa DubinLori A Johnson MRN [id number]  Date of birth: 1976/04/08  CC & HPI:  Marisa Johnson is a 41 y.o. female presenting today for discussion about tubal ligation for contraceptive management. She has no complaints today. Patient's last menstrual period was 09/04/2017 (exact date). She states her periods have been getting heavier recently. The heavy flow lasts three days for her. Her cramping during her periods has gotten worse as well. No alleviating factors noted. She states she has gotten headaches when she begins her periods. Endometrial ablation discussed pros and cons.  Patient is desirous of ablation as she finds the increasing discomfort of her periods progressively greater problem  ROS:  ROS  - fever - chills All systems are negative except as noted in the HPI and PMH.    Pertinent History Reviewed:   Reviewed: Significant for abnormal pap Medical         Past Medical History:  Diagnosis Date  . Anxiety   . Carpal tunnel syndrome   . Contraceptive management 08/09/2015  . Decreased libido 08/09/2015  . Friable cervix 08/09/2015  . History of abnormal cervical Pap smear 08/09/2015  . IBS (irritable bowel syndrome)   . Irregular intermenstrual bleeding 08/09/2015  . Major depressive disorder   . Migraine   . Mixed connective tissue disease (HCC)   . Pseudobulbar affect   . PTSD (post-traumatic stress disorder)   . Thyroid disease   . Vaginal Pap smear, abnormal                               Surgical Hx:    Past Surgical History:  Procedure Laterality Date  . DILATION AND CURETTAGE OF UTERUS N/A 07/25/2017   Procedure: SUCTION DILATATION AND CURETTAGE;  Surgeon: Tilda BurrowFerguson, Eller Sweis V, MD;  Location: [facility];  Service: Gynecology;  Laterality: N/A;  . ESOPHAGOGASTRODUODENOSCOPY N/A 11/17/2016   Procedure: ESOPHAGOGASTRODUODENOSCOPY (EGD);  Surgeon: Malissa HippoNajeeb U Rehman, MD;  Location: [facility];  Service: Endoscopy;   Laterality: N/A;  . None     Medications: Reviewed & Updated - see associated section                       Current Outpatient Medications:  .  albuterol (PROVENTIL HFA;VENTOLIN HFA) 108 (90 Base) MCG/ACT inhaler, Inhale 2 puffs into the lungs every 6 (six) hours as needed for wheezing or shortness of breath., Disp: , Rfl:  .  buPROPion (WELLBUTRIN XL) 300 MG 24 hr tablet, Take 300 mg by mouth at bedtime., Disp: , Rfl:  .  clonazePAM (KLONOPIN) 0.5 MG tablet, Take 0.5 mg by mouth 2 (two) times daily. , Disp: , Rfl:  .  ibuprofen (ADVIL,MOTRIN) 600 MG tablet, Take 1 tablet (600 mg total) by mouth every 6 (six) hours as needed., Disp: 30 tablet, Rfl: 1 .  lamoTRIgine (LAMICTAL) 200 MG tablet, Take 300 mg by mouth at bedtime. , Disp: , Rfl:  .  levothyroxine (SYNTHROID, LEVOTHROID) 150 MCG tablet, Take 1 tablet by mouth daily., Disp: , Rfl:  .  linaclotide (LINZESS) 145 MCG CAPS capsule, Take 1 capsule (145 mcg total) daily before breakfast by mouth., Disp: 90 capsule, Rfl: 3 .  meclizine (ANTIVERT) 25 MG tablet, Take 25 mg by mouth 3 (three) times daily as needed for dizziness., Disp: , Rfl:  .  ondansetron (ZOFRAN) 4 MG tablet, Take 1 tablet (4 mg total) by mouth every 8 (eight) hours as needed for nausea or vomiting., Disp: 20 tablet, Rfl: 1 .  traMADol (ULTRAM) 50 MG tablet, Take 50-100 mg by mouth every 6 (six) hours as needed for moderate pain. , Disp: , Rfl:  .  traZODone (DESYREL) 100 MG tablet, Take 100 mg by mouth at bedtime., Disp: , Rfl:  .  HYDROcodone-acetaminophen (NORCO/VICODIN) 5-325 MG tablet, Take 1 tablet by mouth every 6 (six) hours as needed for moderate pain. (Patient not taking: Reported on 09/11/2017), Disp: 30 tablet, Rfl: 0   Social History: Reviewed -  reports that  has never smoked. she has never used smokeless tobacco.  Objective Findings:  Vitals: Blood pressure 104/66, pulse 82, height 5' 3.5" (1.613 m), weight 184 lb (83.5 kg), last menstrual period  09/04/2017.  Physical Examination: General appearance - alert, well appearing, and in no distress Mental status - alert, oriented to person, place, and time Pelvic -  VULVA: normal appearing vulva with no masses, tenderness or lesions,  VAGINA: normal appearing vagina with normal color and discharge, no lesions,  CERVIX: normal appearing cervix without discharge or lesions,  UTERUS: tilts towards back, tiny, normal size, shape, consistency and nontender, a review of the ultrasound performed recently due to her pregnancy showed essentially normal uterus  ADNEXA: normal adnexa in size, nontender and no masses   Discussion: 1. Discussed with pt risks and benefits of tubal ligation for contraceptive management. The option of removing the tubes vs. clipping them was also discussed. Endometrial ablation was discussed as an option to manage pt's menstrual cycle.  At end of discussion, pt had opportunity to ask questions and has no further questions at this time.   Specific discussion as noted above. Greater than 50% was spent in counseling and coordination of care with the patient.   Total time greater than: 25 minutes.     Assessment & Plan:   A:  1. Contraceptive management discussed including pros and cons of salpingectomy versus tubal ligation with clips, with theoretic 50% reduction in risk of cancer in the tube and ovary area, lifetime risk reduction. 2. Physical assessment done for pre-op  P:  1. Schedule surgery for Dec 18 for HYSTEROSCOPY, DILATION AND CURETTAGE WITH endometrial ablation and laparoscopic bilateral salpingectomy 2. F/u in 6 weeks for post-op  Note: Pt given a [employer] endometrial ablation brochure, with review     By signing my name below, I, Izna Ahmed, attest that this documentation has been prepared under the direction and in the presence of Tilda BurrowFerguson, Seema Blum V, MD. Electronically Signed: Redge GainerIzna Ahmed, Medical Scribe. 09/11/17. 9:05 AM.  I personally performed  the services described in this documentation, which was SCRIBED in my presence. The recorded information has been reviewed and considered accurate. It has been edited as necessary during review. Tilda BurrowJohn V Wilder Kurowski, MD

## 2017-09-22 NOTE — Patient Instructions (Signed)
Marisa Johnson  09/22/2017     @PREFPERIOPPHARMACY @   Your procedure is scheduled on  09/30/2017  Report to [facility] at  700   A.M.  Call this number if you have problems the morning of surgery:  [phone]   Remember:  Do not eat food or drink liquids after midnight.  Take these medicines the morning of surgery with A SIP OF WATER  Klonopin, neurontin, levothyroxine, zofran, ultram, maxalt (if needed).   Do not wear jewelry, make-up or nail polish.  Do not wear lotions, powders, or perfumes, or deodorant.  Do not shave 48 hours prior to surgery.  Men may shave face and neck.  Do not bring valuables to the hospital.  [facility] is not responsible for any belongings or valuables.  Contacts, dentures or bridgework may not be worn into surgery.  Leave your suitcase in the car.  After surgery it may be brought to your room.  For patients admitted to the hospital, discharge time will be determined by your treatment team.  Patients discharged the day of surgery will not be allowed to drive home.   Name and phone number of your driver:   family Special instructions:  None  Please read over the following fact sheets that you were given. Anesthesia Post-op Instructions and Care and Recovery After Surgery       Endometrial Ablation Endometrial ablation is a procedure that destroys the thin inner layer of the lining of the uterus (endometrium). This procedure may be done:  To stop heavy periods.  To stop bleeding that is causing anemia.  To control irregular bleeding.  To treat bleeding caused by small tumors (fibroids) in the endometrium.  This procedure is often an alternative to major surgery, such as removal of the uterus and cervix (hysterectomy). As a result of this procedure:  You may not be able to have children. However, if you are premenopausal (you have not gone through menopause): ? You may still have a small chance of  getting pregnant. ? You will need to use a reliable method of birth control after the procedure to prevent pregnancy.  You may stop having a menstrual period, or you may have only a small amount of bleeding during your period. Menstruation may return several years after the procedure.  Tell a health care provider about:  Any allergies you have.  All medicines you are taking, including vitamins, herbs, eye drops, creams, and over-the-counter medicines.  Any problems you or family members have had with the use of anesthetic medicines.  Any blood disorders you have.  Any surgeries you have had.  Any medical conditions you have. What are the risks? Generally, this is a safe procedure. However, problems may occur, including:  A hole (perforation) in the uterus or bowel.  Infection of the uterus, bladder, or vagina.  Bleeding.  Damage to other structures or organs.  An air bubble in the lung (air embolus).  Problems with pregnancy after the procedure.  Failure of the procedure.  Decreased ability to diagnose cancer in the endometrium.  What happens before the procedure?  You will have tests of your endometrium to make sure there are no pre-cancerous cells or cancer cells present.  You may have an ultrasound of the uterus.  You may be given medicines to thin the endometrium.  Ask your health care provider about: ? Changing  or stopping your regular medicines. This is especially important if you take diabetes medicines or blood thinners. ? Taking medicines such as aspirin and ibuprofen. These medicines can thin your blood. Do not take these medicines before your procedure if your doctor tells you not to.  Plan to have someone take you home from the hospital or clinic. What happens during the procedure?  You will lie on an exam table with your feet and legs supported as in a pelvic exam.  To lower your risk of infection: ? Your health care team will wash or sanitize their  hands and put on germ-free (sterile) gloves. ? Your genital area will be washed with soap.  An IV tube will be inserted into one of your veins.  You will be given a medicine to help you relax (sedative).  A surgical instrument with a light and camera (resectoscope) will be inserted into your vagina and moved into your uterus. This allows your surgeon to see inside your uterus.  Endometrial tissue will be removed using one of the following methods: ? Radiofrequency. This method uses a radiofrequency-alternating electric current to remove the endometrium. ? Cryotherapy. This method uses extreme cold to freeze the endometrium. ? Heated-free liquid. This method uses a heated saltwater (saline) solution to remove the endometrium. ? Microwave. This method uses high-energy microwaves to heat up the endometrium and remove it. ? Thermal balloon. This method involves inserting a catheter with a balloon tip into the uterus. The balloon tip is filled with heated fluid to remove the endometrium. The procedure may vary among health care providers and hospitals. What happens after the procedure?  Your blood pressure, heart rate, breathing rate, and blood oxygen level will be monitored until the medicines you were given have worn off.  As tissue healing occurs, you may notice vaginal bleeding for 4-6 weeks after the procedure. You may also experience: ? Cramps. ? Thin, watery vaginal discharge that is light pink or brown in color. ? A need to urinate more frequently than usual. ? Nausea.  Do not drive for 24 hours if you were given a sedative.  Do not have sex or insert anything into your vagina until your health care provider approves. Summary  Endometrial ablation is done to treat the many causes of heavy menstrual bleeding.  The procedure may be done only after medications have been tried to control the bleeding.  Plan to have someone take you home from the hospital or clinic. This information  is not intended to replace advice given to you by your health care provider. Make sure you discuss any questions you have with your health care provider. Document Released: 08/09/2004 Document Revised: 10/17/2016 Document Reviewed: 10/17/2016 Elsevier Interactive Patient Education  2017 [employer] Hysteroscopy Hysteroscopy is a procedure used for looking inside the womb (uterus). It may be done for various reasons, including:  To evaluate abnormal bleeding, fibroid (benign, noncancerous) tumors, polyps, scar tissue (adhesions), and possibly cancer of the uterus.  To look for lumps (tumors) and other uterine growths.  To look for causes of why a woman cannot get pregnant (infertility), causes of recurrent loss of pregnancy (miscarriages), or a lost intrauterine device (IUD).  To perform a sterilization by blocking the fallopian tubes from inside the uterus.  In this procedure, a thin, flexible tube with a tiny light and camera on the end of it (hysteroscope) is used to look inside the uterus. A hysteroscopy should be done right after a menstrual period to be sure  you are not pregnant. LET [facility] CARE PROVIDER KNOW ABOUT:  Any allergies you have.  All medicines you are taking, including vitamins, herbs, eye drops, creams, and over-the-counter medicines.  Previous problems you or members of your family have had with the use of anesthetics.  Any blood disorders you have.  Previous surgeries you have had.  Medical conditions you have. RISKS AND COMPLICATIONS Generally, this is a safe procedure. However, as with any procedure, complications can occur. Possible complications include:  Putting a hole in the uterus.  Excessive bleeding.  Infection.  Damage to the cervix.  Injury to other organs.  Allergic reaction to medicines.  Too much fluid used in the uterus for the procedure.  BEFORE THE PROCEDURE  Ask your health care provider about changing or stopping any  regular medicines.  Do not take aspirin or blood thinners for 1 week before the procedure, or as directed by your health care provider. These can cause bleeding.  If you smoke, do not smoke for 2 weeks before the procedure.  In some cases, a medicine is placed in the cervix the day before the procedure. This medicine makes the cervix have a larger opening (dilate). This makes it easier for the instrument to be inserted into the uterus during the procedure.  Do not eat or drink anything for at least 8 hours before the surgery.  Arrange for someone to take you home after the procedure. PROCEDURE  You may be given a medicine to relax you (sedative). You may also be given one of the following: ? A medicine that numbs the area around the cervix (local anesthetic). ? A medicine that makes you sleep through the procedure (general anesthetic).  The hysteroscope is inserted through the vagina into the uterus. The camera on the hysteroscope sends a picture to a TV screen. This gives the surgeon a good view inside the uterus.  During the procedure, air or a liquid is put into the uterus, which allows the surgeon to see better.  Sometimes, tissue is gently scraped from inside the uterus. These tissue samples are sent to a lab for testing. What to expect after the procedure  If you had a general anesthetic, you may be groggy for a couple hours after the procedure.  If you had a local anesthetic, you will be able to go home as soon as you are stable and feel ready.  You may have some cramping. This normally lasts for a couple days.  You may have bleeding, which varies from light spotting for a few days to menstrual-like bleeding for 3-7 days. This is normal.  If your test results are not back during the visit, make an appointment with your health care provider to find out the results. This information is not intended to replace advice given to you by your health care provider. Make sure you  discuss any questions you have with your health care provider. Document Released: 01/06/2001 Document Revised: 03/07/2016 Document Reviewed: 04/29/2013 Elsevier Interactive Patient Education  2017 [employer]  Dilation and Curettage or Vacuum Curettage Dilation and curettage (D&C) and vacuum curettage are minor procedures. A D&C involves stretching (dilation) the cervix and scraping (curettage) the inside lining of the uterus (endometrium). During a D&C, tissue is gently scraped from the endometrium, starting from the top portion of the uterus down to the lowest part of the uterus (cervix). During a vacuum curettage, the lining and tissue in the uterus are removed with the use of gentle suction. Curettage  may be performed to either diagnose or treat a problem. As a diagnostic procedure, curettage is performed to examine tissues from the uterus. A diagnostic curettage may be done if you have:  Irregular bleeding in the uterus.  Bleeding with the development of clots.  Spotting between menstrual periods.  Prolonged menstrual periods or other abnormal bleeding.  Bleeding after menopause.  No menstrual period (amenorrhea).  A change in size and shape of the uterus.  Abnormal endometrial cells discovered during a Pap test.  As a treatment procedure, curettage may be performed for the following reasons:  Removal of an IUD (intrauterine device).  Removal of retained placenta after giving birth.  Abortion.  Miscarriage.  Removal of endometrial polyps.  Removal of uncommon types of noncancerous lumps (fibroids).  Tell a health care provider about:  Any allergies you have, including allergies to prescribed medicine or latex.  All medicines you are taking, including vitamins, herbs, eye drops, creams, and over-the-counter medicines. This is especially important if you take any blood-thinning medicine. Bring a list of all of your medicines to your appointment.  Any problems you or  family members have had with anesthetic medicines.  Any blood disorders you have.  Any surgeries you have had.  Your medical history and any medical conditions you have.  Whether you are pregnant or may be pregnant.  Recent vaginal infections you have had.  Recent menstrual periods, bleeding problems you have had, and what form of birth control (contraception) you use. What are the risks? Generally, this is a safe procedure. However, problems may occur, including:  Infection.  Heavy vaginal bleeding.  Allergic reactions to medicines.  Damage to the cervix or other structures or organs.  Development of scar tissue (adhesions) inside the uterus, which can cause abnormal amounts of menstrual bleeding. This may make it harder to get pregnant in the future.  A hole (perforation) or puncture in the uterine wall. This is rare.  What happens before the procedure? Staying hydrated Follow instructions from your health care provider about hydration, which may include:  Up to 2 hours before the procedure - you may continue to drink clear liquids, such as water, clear fruit juice, black coffee, and plain tea.  Eating and drinking restrictions Follow instructions from your health care provider about eating and drinking, which may include:  8 hours before the procedure - stop eating heavy meals or foods such as meat, fried foods, or fatty foods.  6 hours before the procedure - stop eating light meals or foods, such as toast or cereal.  6 hours before the procedure - stop drinking milk or drinks that contain milk.  2 hours before the procedure - stop drinking clear liquids. If your health care provider told you to take your medicine(s) on the day of your procedure, take them with only a sip of water.  Medicines  Ask your health care provider about: ? Changing or stopping your regular medicines. This is especially important if you are taking diabetes medicines or blood  thinners. ? Taking medicines such as aspirin and ibuprofen. These medicines can thin your blood. Do not take these medicines before your procedure if your health care provider instructs you not to.  You may be given antibiotic medicine to help prevent infection. General instructions  For 24 hours before your procedure, do not: ? Douche. ? Use tampons. ? Use medicines, creams, or suppositories in the vagina. ? Have sexual intercourse.  You may be given a pregnancy test on the  day of the procedure.  Plan to have someone take you home from the hospital or clinic.  You may have a blood or urine sample taken.  If you will be going home right after the procedure, plan to have someone with you for 24 hours. What happens during the procedure?  To reduce your risk of infection: ? Your health care team will wash or sanitize their hands. ? Your skin will be washed with soap.  An IV tube will be inserted into one of your veins.  You will be given one of the following: ? A medicine that numbs the area in and around the cervix (local anesthetic). ? A medicine to make you fall asleep (general anesthetic).  You will lie down on your back, with your feet in foot rests (stirrups).  The size and position of your uterus will be checked.  A lubricated instrument (speculum or Sims retractor) will be inserted into the back side of your vagina. The speculum will be used to hold apart the walls of your vagina so your health care provider can see your cervix.  A tool (tenaculum) will be attached to the lip of the cervix to stabilize it.  Your cervix will be softened and dilated. This may be done by: ? Taking a medicine. ? Having tapered dilators or thin rods (laminaria) or gradual widening instruments (tapered dilators) inserted into your cervix.  A small, sharp, curved instrument (curette) will be used to scrape a small amount of tissue or cells from the endometrium or cervical canal. In some  cases, gentle suction is applied with the curette. The curette will then be removed. The cells will be taken to a lab for testing. The procedure may vary among health care providers and hospitals. What happens after the procedure?  You may have mild cramping, backache, pain, and light bleeding or spotting. You may pass small blood clots from your vagina.  You may have to wear compression stockings. These stockings help to prevent blood clots and reduce swelling in your legs.  Your blood pressure, heart rate, breathing rate, and blood oxygen level will be monitored until the medicines you were given have worn off. Summary  Dilation and curettage (D&C) involves stretching (dilation) the cervix and scraping (curettage) the inside lining of the uterus (endometrium).  After the procedure, you may have mild cramping, backache, pain, and light bleeding or spotting. You may pass small blood clots from your vagina.  Plan to have someone take you home from the hospital or clinic. This information is not intended to replace advice given to you by your health care provider. Make sure you discuss any questions you have with your health care provider. Document Released: 09/30/2005 Document Revised: 06/16/2016 Document Reviewed: 06/16/2016 Elsevier Interactive Patient Education  2018 [employer].  Dilation and Curettage or Vacuum Curettage, Care After These instructions give you information about caring for yourself after your procedure. Your doctor may also give you more specific instructions. Call your doctor if you have any problems or questions after your procedure. Follow these instructions at home: Activity  Do not drive or use heavy machinery while taking prescription pain medicine.  For 24 hours after your procedure, avoid driving.  Take short walks often, followed by rest periods. Ask your doctor what activities are safe for you. After one or two days, you may be able to return to your  normal activities.  Do not lift anything that is heavier than 10 lb (4.5 kg) until your doctor  approves.  For at least 2 weeks, or as long as told by your doctor: ? Do not douche. ? Do not use tampons. ? Do not have sex. General instructions  Take over-the-counter and prescription medicines only as told by your doctor. This is very important if you take blood thinning medicine.  Do not take baths, swim, or use a hot tub until your doctor approves. Take showers instead of baths.  Wear compression stockings as told by your doctor.  It is up to you to get the results of your procedure. Ask your doctor when your results will be ready.  Keep all follow-up visits as told by your doctor. This is important. Contact a doctor if:  You have very bad cramps that get worse or do not get better with medicine.  You have very bad pain in your belly (abdomen).  You cannot drink fluids without throwing up (vomiting).  You get pain in a different part of the area between your belly and thighs (pelvis).  You have bad-smelling discharge from your vagina.  You have a rash. Get help right away if:  You are bleeding a lot from your vagina. A lot of bleeding means soaking more than one sanitary pad in an hour, for 2 hours in a row.  You have clumps of blood (blood clots) coming from your vagina.  You have a fever or chills.  Your belly feels very tender or hard.  You have chest pain.  You have trouble breathing.  You cough up blood.  You feel dizzy.  You feel light-headed.  You pass out (faint).  You have pain in your neck or shoulder area. Summary  Take short walks often, followed by rest periods. Ask your doctor what activities are safe for you. After one or two days, you may be able to return to your normal activities.  Do not lift anything that is heavier than 10 lb (4.5 kg) until your doctor approves.  Do not take baths, swim, or use a hot tub until your doctor approves. Take  showers instead of baths.  Contact your doctor if you have any symptoms of infection, like bad-smelling discharge from your vagina. This information is not intended to replace advice given to you by your health care provider. Make sure you discuss any questions you have with your health care provider. Document Released: 07/09/2008 Document Revised: 06/17/2016 Document Reviewed: 06/17/2016 Elsevier Interactive Patient Education  2017 [employer]. Hysteroscopy Hysteroscopy is a procedure used for looking inside the womb (uterus). It may be done for various reasons, including:  To evaluate abnormal bleeding, fibroid (benign, noncancerous) tumors, polyps, scar tissue (adhesions), and possibly cancer of the uterus.  To look for lumps (tumors) and other uterine growths.  To look for causes of why a woman cannot get pregnant (infertility), causes of recurrent loss of pregnancy (miscarriages), or a lost intrauterine device (IUD).  To perform a sterilization by blocking the fallopian tubes from inside the uterus.  In this procedure, a thin, flexible tube with a tiny light and camera on the end of it (hysteroscope) is used to look inside the uterus. A hysteroscopy should be done right after a menstrual period to be sure you are not pregnant. LET [facility] CARE PROVIDER KNOW ABOUT:  Any allergies you have.  All medicines you are taking, including vitamins, herbs, eye drops, creams, and over-the-counter medicines.  Previous problems you or members of your family have had with the use of anesthetics.  Any blood disorders  you have.  Previous surgeries you have had.  Medical conditions you have. RISKS AND COMPLICATIONS Generally, this is a safe procedure. However, as with any procedure, complications can occur. Possible complications include:  Putting a hole in the uterus.  Excessive bleeding.  Infection.  Damage to the cervix.  Injury to other organs.  Allergic reaction to  medicines.  Too much fluid used in the uterus for the procedure.  BEFORE THE PROCEDURE  Ask your health care provider about changing or stopping any regular medicines.  Do not take aspirin or blood thinners for 1 week before the procedure, or as directed by your health care provider. These can cause bleeding.  If you smoke, do not smoke for 2 weeks before the procedure.  In some cases, a medicine is placed in the cervix the day before the procedure. This medicine makes the cervix have a larger opening (dilate). This makes it easier for the instrument to be inserted into the uterus during the procedure.  Do not eat or drink anything for at least 8 hours before the surgery.  Arrange for someone to take you home after the procedure. PROCEDURE  You may be given a medicine to relax you (sedative). You may also be given one of the following: ? A medicine that numbs the area around the cervix (local anesthetic). ? A medicine that makes you sleep through the procedure (general anesthetic).  The hysteroscope is inserted through the vagina into the uterus. The camera on the hysteroscope sends a picture to a TV screen. This gives the surgeon a good view inside the uterus.  During the procedure, air or a liquid is put into the uterus, which allows the surgeon to see better.  Sometimes, tissue is gently scraped from inside the uterus. These tissue samples are sent to a lab for testing. What to expect after the procedure  If you had a general anesthetic, you may be groggy for a couple hours after the procedure.  If you had a local anesthetic, you will be able to go home as soon as you are stable and feel ready.  You may have some cramping. This normally lasts for a couple days.  You may have bleeding, which varies from light spotting for a few days to menstrual-like bleeding for 3-7 days. This is normal.  If your test results are not back during the visit, make an appointment with your health  care provider to find out the results. This information is not intended to replace advice given to you by your health care provider. Make sure you discuss any questions you have with your health care provider. Document Released: 01/06/2001 Document Revised: 03/07/2016 Document Reviewed: 04/29/2013 Elsevier Interactive Patient Education  2017 [employer]. Hysteroscopy, Care After Refer to this sheet in the next few weeks. These instructions provide you with information on caring for yourself after your procedure. Your health care provider may also give you more specific instructions. Your treatment has been planned according to current medical practices, but problems sometimes occur. Call your health care provider if you have any problems or questions after your procedure. What can I expect after the procedure? After your procedure, it is typical to have the following:  You may have some cramping. This normally lasts for a couple days.  You may have bleeding. This can vary from light spotting for a few days to menstrual-like bleeding for 3-7 days.  Follow these instructions at home:  Rest for the first 1-2 days after the procedure.  Only take over-the-counter or prescription medicines as directed by your health care provider. Do not take aspirin. It can increase the chances of bleeding.  Take showers instead of baths for 2 weeks or as directed by your health care provider.  Do not drive for 24 hours or as directed.  Do not drink alcohol while taking pain medicine.  Do not use tampons, douche, or have sexual intercourse for 2 weeks or until your health care provider says it is okay.  Take your temperature twice a day for 4-5 days. Write it down each time.  Follow your health care provider's advice about diet, exercise, and lifting.  If you develop constipation, you may: ? Take a mild laxative if your health care provider approves. ? Add bran foods to your diet. ? Drink enough fluids  to keep your urine clear or pale yellow.  Try to have someone with you or available to you for the first 24-48 hours, especially if you were given a general anesthetic.  Follow up with your health care provider as directed. Contact a health care provider if:  You feel dizzy or lightheaded.  You feel sick to your stomach (nauseous).  You have abnormal vaginal discharge.  You have a rash.  You have pain that is not controlled with medicine. Get help right away if:  You have bleeding that is heavier than a normal menstrual period.  You have a fever.  You have increasing cramps or pain, not controlled with medicine.  You have new belly (abdominal) pain.  You pass out.  You have pain in the tops of your shoulders (shoulder strap areas).  You have shortness of breath. This information is not intended to replace advice given to you by your health care provider. Make sure you discuss any questions you have with your health care provider. Document Released: 07/21/2013 Document Revised: 03/07/2016 Document Reviewed: 04/29/2013 Elsevier Interactive Patient Education  2017 [employer]  Salpingectomy Salpingectomy, also called tubectomy, is the surgical removal of one of the fallopian tubes. The fallopian tubes are where eggs travel from the ovaries to the uterus. Removing one fallopian tube does not prevent you from becoming pregnant. It also does not cause problems with your menstrual periods. You may need a salpingectomy if you:  Have a fertilized egg that attaches to the fallopian tube (ectopic pregnancy), especially one that causes the tube to burst or tear (rupture).  Have an infected fallopian tube.  Have cancer of the fallopian tube or nearby organs.  Have had an ovary removed due to a cyst or tumor.  Have had your uterus removed.  There are three different methods that can be used for a salpingectomy:  Open. This method involves making one large incision in your  abdomen.  Laparoscopic. This method involves using a thin, lighted tube with a tiny camera on the end (laparoscope) to help perform the procedure. The laparoscope will allow your surgeon to make several small incisions in the abdomen instead of a large incision.  Robot-assisted: This method involves using a computer to control surgical instruments that are attached to robotic arms.  Tell a health care provider about:  Any allergies you have.  All medicines you are taking, including vitamins, herbs, eye drops, creams, and over-the-counter medicines.  Any problems you or family members have had with anesthetic medicines.  Any blood disorders you have.  Any surgeries you have had.  Any medical conditions you have.  Whether you are pregnant or may be pregnant. What  are the risks? Generally, this is a safe procedure. However, problems may occur, including:  Infection.  Bleeding.  Allergic reactions to medicines.  Damage to other structures or organs.  Blood clots in the legs or lungs.  What happens before the procedure? Staying hydrated Follow instructions from your health care provider about hydration, which may include:  Up to 2 hours before the procedure - you may continue to drink clear liquids, such as water, clear fruit juice, black coffee, and plain tea.  Eating and drinking restrictions Follow instructions from your health care provider about eating and drinking, which may include:  8 hours before the procedure - stop eating heavy meals or foods such as meat, fried foods, or fatty foods.  6 hours before the procedure - stop eating light meals or foods, such as toast or cereal.  6 hours before the procedure - stop drinking milk or drinks that contain milk.  2 hours before the procedure - stop drinking clear liquids.  Medicines  Ask your health care provider about: ? Changing or stopping your regular medicines. This is especially important if you are taking  diabetes medicines or blood thinners. ? Taking medicines such as aspirin and ibuprofen. These medicines can thin your blood. Do not take these medicines before your procedure if your health care provider instructs you not to.  You may be given antibiotic medicine to help prevent infection. General instructions  Do not smoke for at least 2 weeks before your procedure. If you need help quitting, ask your health care provider.  You may have an exam or tests, such as an electrocardiogram (ECG).  You may have a blood or urine sample taken.  Ask your health care provider: ? Whether you should stop removing hair from your surgical area. ? How your surgical site will be marked or identified.  You may be asked to shower with a germ-killing soap.  Plan to have someone take you home from the hospital or clinic.  If you will be going home right after the procedure, plan to have someone with you for 24 hours. What happens during the procedure?  To reduce your risk of infection: ? Your health care team will wash or sanitize their hands. ? Hair may be removed from the surgical area. ? Your skin will be washed with soap.  An IV tube will be inserted into one of your veins.  You will be given a medicine to make you fall asleep (general anesthetic). You may also be given a medicine to help you relax (sedative).  A thin tube (catheter) may be inserted through your urethra and into your bladder to drain urine during your procedure.  Depending on the type of procedure you are having, one incision or several small incisions will be made in your abdomen.  Your fallopian tube will be cut and removed from where it attaches to your uterus.  Your blood vessels will be clamped and tied to prevent excess bleeding.  The incision(s) in your abdomen will be closed with stitches (sutures), staples, or skin glue.  A bandage (dressing) may be placed over your incision(s). The procedure may vary among health  care providers and hospitals. What happens after the procedure?  Your blood pressure, heart rate, breathing rate, and blood oxygen level will be monitored until the medicines you were given have worn off.  You may continue to receive fluids and medicines through an IV tube.  You may continue to have a catheter draining your urine.  You may have to wear compression stockings. These stockings help to prevent blood clots and reduce swelling in your legs.  You will be given pain medicine as needed.  Do not drive for 24 hours if you received a sedative. Summary  Salpingectomy is a surgical procedure to remove one of the fallopian tubes.  The procedure may be done with an open incision, with a laparoscope, or with computer-controlled instruments.  Depending on the type of procedure you are having, one incision or several small incisions will be made in your abdomen.  Your blood pressure, heart rate, breathing rate, and blood oxygen level will be monitored until the medicines you were given have worn off.  Plan to have someone take you home from the hospital or clinic. This information is not intended to replace advice given to you by your health care provider. Make sure you discuss any questions you have with your health care provider. Document Released: 02/16/2009 Document Revised: 05/17/2016 Document Reviewed: 03/24/2013 Elsevier Interactive Patient Education  2018 [employer]  Salpingectomy, Care After This sheet gives you information about how to care for yourself after your procedure. Your health care provider may also give you more specific instructions. If you have problems or questions, contact your health care provider. What can I expect after the procedure? After your procedure, it is common to have:  Pain in your abdomen.  Some occasional vaginal bleeding (spotting).  Tiredness.  Follow these instructions at home: Incision care   Keep your incision area and your  bandage (dressing) clean and dry.  Follow instructions from your health care provider about how to take care of your incision. Make sure you: ? Wash your hands with soap and water before you change your dressing. If soap and water are not available, use hand sanitizer. ? Change your dressing astold by your health care provider. ? Leave stitches (sutures), staples, skin glue, or adhesive strips in place. These skin closures may need to stay in place for 2 weeks or longer. If adhesive strip edges start to loosen and curl up, you may trim the loose edges. Do not remove adhesive strips completely unless your health care provider tells you to do that.  Check your incision area every day for signs of infection. Check for: ? More redness, swelling, or pain. ? More fluid or blood. ? Warmth. ? Pus or a bad smell. Activity   Do not drive or use heavy machinery while taking prescription pain medicine.  Do not drive for 24 hours if you received a medicine to help you relax (sedative).  Rest as directed by your health care provider. Ask your health care provider what activities are safe for you. You should avoid: ? Lifting anything that is heavier than 10 lb (4.5 kg) until your health care provider approves. ? Activities that require a lot of energy.  Until your health care provider approves: ? Do not douche. ? Do not use tampons. ? Do not have sexual intercourse. General instructions  Take over-the-counter and prescription medicines only as told by your health care provider.  To prevent or treat constipation while you are taking prescription pain medicine, your health care provider may recommend that you: ? Drink enough fluid to keep your urine clear or pale yellow. ? Take over-the-counter or prescription medicines. ? Eat foods that are high in fiber, such as fresh fruits and vegetables, whole grains, and beans. ? Limit foods that are high in fat and processed sugars, such as fried and sweet  foods.  Do not take baths, swim, or use a hot tub until your health care provider approves. You may take showers.  Wear compression stockings as told by your health care provider. These stockings help to prevent blood clots and reduce swelling in your legs.  Keep all follow-up visits as told by your health care provider. This is important. Contact a health care provider if:  You have: ? Pain when you urinate. ? More redness, swelling, or pain around your incision. ? More fluid or blood coming from your incision. ? Pus or a bad smell coming from your incision. ? A fever. ? Abdominal pain that gets worse or does not get better with medicine.  Your incision feels warm to the touch.  Your incision starts to break open.  You develop a rash.  You develop nausea and vomiting.  You feel light-headed. Get help right away if:  You develop pain in your chest or leg.  You develop shortness of breath.  You faint.  You have increased vaginal bleeding. This information is not intended to replace advice given to you by your health care provider. Make sure you discuss any questions you have with your health care provider. Document Released: 01/04/2011 Document Revised: 05/29/2016 Document Reviewed: 05/30/2016 Elsevier Interactive Patient Education  2018 [employer].  General Anesthesia, Adult General anesthesia is the use of medicines to make a person "go to sleep" (be unconscious) for a medical procedure. General anesthesia is often recommended when a procedure:  Is long.  Requires you to be still or in an unusual position.  Is major and can cause you to lose blood.  Is impossible to do without general anesthesia.  The medicines used for general anesthesia are called general anesthetics. In addition to making you sleep, the medicines:  Prevent pain.  Control your blood pressure.  Relax your muscles.  Tell a health care provider about:  Any allergies you have.  All  medicines you are taking, including vitamins, herbs, eye drops, creams, and over-the-counter medicines.  Any problems you or family members have had with anesthetic medicines.  Types of anesthetics you have had in the past.  Any bleeding disorders you have.  Any surgeries you have had.  Any medical conditions you have.  Any history of heart or lung conditions, such as heart failure, sleep apnea, or chronic obstructive pulmonary disease (COPD).  Whether you are pregnant or may be pregnant.  Whether you use tobacco, alcohol, marijuana, or street drugs.  Any history of [employer].  Any history of depression or anxiety. What are the risks? Generally, this is a safe procedure. However, problems may occur, including:  Allergic reaction to anesthetics.  Lung and heart problems.  Inhaling food or liquids from your stomach into your lungs (aspiration).  Injury to nerves.  Waking up during your procedure and being unable to move (rare).  Extreme agitation or a state of mental confusion (delirium) when you wake up from the anesthetic.  Air in the bloodstream, which can lead to stroke.  These problems are more likely to develop if you are having a major surgery or if you have an advanced medical condition. You can prevent some of these complications by answering all of your health care provider's questions thoroughly and by following all pre-procedure instructions. General anesthesia can cause side effects, including:  Nausea or vomiting  A sore throat from the breathing tube.  Feeling cold or shivery.  Feeling tired, washed out, or achy.  Sleepiness or drowsiness.  Confusion or agitation.  What happens before the procedure? Staying hydrated Follow instructions from your health care provider about hydration, which may include:  Up to 2 hours before the procedure - you may continue to drink clear liquids, such as water, clear fruit juice, black coffee, and plain  tea.  Eating and drinking restrictions Follow instructions from your health care provider about eating and drinking, which may include:  8 hours before the procedure - stop eating heavy meals or foods such as meat, fried foods, or fatty foods.  6 hours before the procedure - stop eating light meals or foods, such as toast or cereal.  6 hours before the procedure - stop drinking milk or drinks that contain milk.  2 hours before the procedure - stop drinking clear liquids.  Medicines  Ask your health care provider about: ? Changing or stopping your regular medicines. This is especially important if you are taking diabetes medicines or blood thinners. ? Taking medicines such as aspirin and ibuprofen. These medicines can thin your blood. Do not take these medicines before your procedure if your health care provider instructs you not to. ? Taking new dietary supplements or medicines. Do not take these during the week before your procedure unless your health care provider approves them.  If you are told to take a medicine or to continue taking a medicine on the day of the procedure, take the medicine with sips of water. General instructions   Ask if you will be going home the same day, the following day, or after a longer hospital stay. ? Plan to have someone take you home. ? Plan to have someone stay with you for the first 24 hours after you leave the hospital or clinic.  For 3-6 weeks before the procedure, try not to use any tobacco products, such as cigarettes, chewing tobacco, and e-cigarettes.  You may brush your teeth on the morning of the procedure, but make sure to spit out the toothpaste. What happens during the procedure?  You will be given anesthetics through a mask and through an IV tube in one of your veins.  You may receive medicine to help you relax (sedative).  As soon as you are asleep, a breathing tube may be used to help you breathe.  An anesthesia specialist will  stay with you throughout the procedure. He or she will help keep you comfortable and safe by continuing to give you medicines and adjusting the amount of medicine that you get. He or she will also watch your blood pressure, pulse, and oxygen levels to make sure that the anesthetics do not cause any problems.  If a breathing tube was used to help you breathe, it will be removed before you wake up. The procedure may vary among health care providers and hospitals. What happens after the procedure?  You will wake up, often slowly, after the procedure is complete, usually in a recovery area.  Your blood pressure, heart rate, breathing rate, and blood oxygen level will be monitored until the medicines you were given have worn off.  You may be given medicine to help you calm down if you feel anxious or agitated.  If you will be going home the same day, your health care provider may check to make sure you can stand, drink, and urinate.  Your health care providers will treat your pain and side effects before you go home.  Do not drive for 24 hours if you received a sedative.  You may: ?  Feel nauseous and vomit. ? Have a sore throat. ? Have mental slowness. ? Feel cold or shivery. ? Feel sleepy. ? Feel tired. ? Feel sore or achy, even in parts of your body where you did not have surgery. This information is not intended to replace advice given to you by your health care provider. Make sure you discuss any questions you have with your health care provider. Document Released: 01/07/2008 Document Revised: 03/12/2016 Document Reviewed: 09/14/2015 Elsevier Interactive Patient Education  2018 [employer]. General Anesthesia, Adult, Care After These instructions provide you with information about caring for yourself after your procedure. Your health care provider may also give you more specific instructions. Your treatment has been planned according to current medical practices, but problems sometimes  occur. Call your health care provider if you have any problems or questions after your procedure. What can I expect after the procedure? After the procedure, it is common to have:  Vomiting.  A sore throat.  Mental slowness.  It is common to feel:  Nauseous.  Cold or shivery.  Sleepy.  Tired.  Sore or achy, even in parts of your body where you did not have surgery.  Follow these instructions at home: For at least 24 hours after the procedure:  Do not: ? Participate in activities where you could fall or become injured. ? Drive. ? Use heavy machinery. ? Drink alcohol. ? Take sleeping pills or medicines that cause drowsiness. ? Make important decisions or sign legal documents. ? Take care of children on your own.  Rest. Eating and drinking  If you vomit, drink water, juice, or soup when you can drink without vomiting.  Drink enough fluid to keep your urine clear or pale yellow.  Make sure you have little or no nausea before eating solid foods.  Follow the diet recommended by your health care provider. General instructions  Have a responsible adult stay with you until you are awake and alert.  Return to your normal activities as told by your health care provider. Ask your health care provider what activities are safe for you.  Take over-the-counter and prescription medicines only as told by your health care provider.  If you smoke, do not smoke without supervision.  Keep all follow-up visits as told by your health care provider. This is important. Contact a health care provider if:  You continue to have nausea or vomiting at home, and medicines are not helpful.  You cannot drink fluids or start eating again.  You cannot urinate after 8-12 hours.  You develop a skin rash.  You have fever.  You have increasing redness at the site of your procedure. Get help right away if:  You have difficulty breathing.  You have chest pain.  You have unexpected  bleeding.  You feel that you are having a life-threatening or urgent problem. This information is not intended to replace advice given to you by your health care provider. Make sure you discuss any questions you have with your health care provider. Document Released: 01/06/2001 Document Revised: 03/04/2016 Document Reviewed: 09/14/2015 Elsevier Interactive Patient Education  [employer].

## 2017-09-23 ENCOUNTER — Encounter (HOSPITAL_COMMUNITY)
Admission: RE | Admit: 2017-09-23 | Discharge: 2017-09-23 | Disposition: A | Discharge status: Home / Self Care | Payer: 59 | Source: Ambulatory Visit | Attending: Obstetrics and Gynecology | Admitting: Obstetrics and Gynecology

## 2017-09-23 ENCOUNTER — Other Ambulatory Visit: Payer: Self-pay

## 2017-09-23 ENCOUNTER — Encounter (HOSPITAL_COMMUNITY): Payer: Self-pay

## 2017-09-23 ENCOUNTER — Other Ambulatory Visit: Payer: Self-pay | Admitting: Obstetrics and Gynecology

## 2017-09-23 DIAGNOSIS — Z01818 Encounter for other preprocedural examination: Secondary | ICD-10-CM | POA: Insufficient documentation

## 2017-09-23 HISTORY — DX: Hypothyroidism, unspecified: E03.9

## 2017-09-23 LAB — HCG, SERUM, QUALITATIVE: Preg, Serum: NEGATIVE

## 2017-09-23 LAB — COMPREHENSIVE METABOLIC PANEL
ALT: 14 U/L (ref 14–54)
AST: 18 U/L (ref 15–41)
Albumin: 4.4 g/dL (ref 3.5–5.0)
Alkaline Phosphatase: 54 U/L (ref 38–126)
Anion gap: 8 (ref 5–15)
BUN: 14 mg/dL (ref 6–20)
CHLORIDE: 101 mmol/L (ref 101–111)
CO2: 27 mmol/L (ref 22–32)
CREATININE: 0.92 mg/dL (ref 0.44–1.00)
Calcium: 9.5 mg/dL (ref 8.9–10.3)
GFR calc non Af Amer: >60 mL/min (ref >60)
Glucose, Bld: 90 mg/dL (ref 65–99)
POTASSIUM: 3.6 mmol/L (ref 3.5–5.1)
SODIUM: 136 mmol/L (ref 135–145)
Total Bilirubin: 0.6 mg/dL (ref 0.3–1.2)
Total Protein: 7.1 g/dL (ref 6.5–8.1)

## 2017-09-23 LAB — CBC
HCT: 40.7 % (ref 36.0–46.0)
Hemoglobin: 12.9 g/dL (ref 12.0–15.0)
MCH: 28.7 pg (ref 26.0–34.0)
MCHC: 31.7 g/dL (ref 30.0–36.0)
MCV: 90.6 fL (ref 78.0–100.0)
PLATELETS: 245 10*3/uL (ref 150–400)
RBC: 4.49 MIL/uL (ref 3.87–5.11)
RDW: 13.9 % (ref 11.5–15.5)
WBC: 6.6 10*3/uL (ref 4.0–10.5)

## 2017-09-23 NOTE — Progress Notes (Signed)
[facility] Visit  09/11/2017            Patient name: Marisa Johnson   MRN [id number]  Date of birth: [birth year]  CC & HPI:  Marisa Johnson is a 41 y.o. female presenting today for discussion about tubal ligation for contraceptive management.  She desires permanent sterilization Techniques of sterilization reviewed, including Salpingectomy with it's risk benefit profile reviewed. She has no complaints today. Patient's last menstrual period was 09/04/2017 (exact date). She states her periods have been getting heavier recently. The heavy flow lasts three days for her. Her cramping during her periods has gotten worse as well. No alleviating factors noted. She states she has gotten headaches when she begins her periods. Endometrial ablation discussed pros and cons.  Patient is desirous of ablation as she finds the increasing discomfort of her periods progressively greater problem  ROS:  ROS  - fever - chills All systems are negative except as noted in the HPI and PMH.    Pertinent History Reviewed:   Reviewed: Significant for abnormal pap Medical             Past Medical History:  Diagnosis Date  . Anxiety   . Carpal tunnel syndrome   . Contraceptive management 08/09/2015  . Decreased libido 08/09/2015  . Friable cervix 08/09/2015  . History of abnormal cervical Pap smear 08/09/2015  . IBS (irritable bowel syndrome)   . Irregular intermenstrual bleeding 08/09/2015  . Major depressive disorder   . Migraine   . Mixed connective tissue disease (HCC)   . Pseudobulbar affect   . PTSD (post-traumatic stress disorder)   . Thyroid disease   . Vaginal Pap smear, abnormal                               Surgical Hx:         Past Surgical History:  Procedure Laterality Date  . DILATION AND CURETTAGE OF UTERUS N/A 07/25/2017   Procedure: SUCTION DILATATION AND CURETTAGE;  Surgeon: Tilda BurrowFerguson, Shelly Shoultz V, MD;  Location: [facility];  Service: Gynecology;  Laterality: N/A;   . ESOPHAGOGASTRODUODENOSCOPY N/A 11/17/2016   Procedure: ESOPHAGOGASTRODUODENOSCOPY (EGD);  Surgeon: Malissa HippoNajeeb U Rehman, MD;  Location: [facility];  Service: Endoscopy;  Laterality: N/A;  . None     Medications: Reviewed & Updated - see associated section                       Current Outpatient Medications:  .  albuterol (PROVENTIL HFA;VENTOLIN HFA) 108 (90 Base) MCG/ACT inhaler, Inhale 2 puffs into the lungs every 6 (six) hours as needed for wheezing or shortness of breath., Disp: , Rfl:  .  buPROPion (WELLBUTRIN XL) 300 MG 24 hr tablet, Take 300 mg by mouth at bedtime., Disp: , Rfl:  .  clonazePAM (KLONOPIN) 0.5 MG tablet, Take 0.5 mg by mouth 2 (two) times daily. , Disp: , Rfl:  .  ibuprofen (ADVIL,MOTRIN) 600 MG tablet, Take 1 tablet (600 mg total) by mouth every 6 (six) hours as needed., Disp: 30 tablet, Rfl: 1 .  lamoTRIgine (LAMICTAL) 200 MG tablet, Take 300 mg by mouth at bedtime. , Disp: , Rfl:  .  levothyroxine (SYNTHROID, LEVOTHROID) 150 MCG tablet, Take 1 tablet by mouth daily., Disp: , Rfl:  .  linaclotide (LINZESS) 145 MCG CAPS capsule, Take 1 capsule (145 mcg total) daily before breakfast by mouth., Disp: 90  capsule, Rfl: 3 .  meclizine (ANTIVERT) 25 MG tablet, Take 25 mg by mouth 3 (three) times daily as needed for dizziness., Disp: , Rfl:  .  ondansetron (ZOFRAN) 4 MG tablet, Take 1 tablet (4 mg total) by mouth every 8 (eight) hours as needed for nausea or vomiting., Disp: 20 tablet, Rfl: 1 .  traMADol (ULTRAM) 50 MG tablet, Take 50-100 mg by mouth every 6 (six) hours as needed for moderate pain. , Disp: , Rfl:  .  traZODone (DESYREL) 100 MG tablet, Take 100 mg by mouth at bedtime., Disp: , Rfl:  .  HYDROcodone-acetaminophen (NORCO/VICODIN) 5-325 MG tablet, Take 1 tablet by mouth every 6 (six) hours as needed for moderate pain. (Patient not taking: Reported on 09/11/2017), Disp: 30 tablet, Rfl: 0   Social History: Reviewed -  reports that  has never smoked. she has never  used smokeless tobacco.  Objective Findings:  Vitals: Blood pressure 104/66, pulse 82, height 5' 3.5" (1.613 m), weight 184 lb (83.5 kg), last menstrual period 09/04/2017.  Physical Examination: General appearance - alert, well appearing, and in no distress Mental status - alert, oriented to person, place, and time Pelvic -  VULVA: normal appearing vulva with no masses, tenderness or lesions,  VAGINA: normal appearing vagina with normal color and discharge, no lesions,  CERVIX: normal appearing cervix without discharge or lesions,  UTERUS: tilts towards back, tiny, normal size, shape, consistency and nontender, a review of the ultrasound performed recently due to her pregnancy showed essentially normal uterus  ADNEXA: normal adnexa in size, nontender and no masses   Discussion: 1. Discussed with pt risks and benefits of tubal ligation for contraceptive management. The option of removing the tubes vs. clipping them was also discussed. Endometrial ablation was discussed as an option to manage pt's menstrual cycle.  At end of discussion, pt had opportunity to ask questions and has no further questions at this time.   Specific discussion as noted above. Greater than 50% was spent in counseling and coordination of care with the patient.   Total time greater than: 25 minutes.     Assessment & Plan:   A:  1. Contraceptive management discussed including pros and cons of salpingectomy versus tubal ligation with clips, with theoretic 50% reduction in risk of cancer in the tube and ovary area, lifetime risk reduction. 2. Physical assessment done for pre-op  P:  1. Schedule surgery for Dec 18 for HYSTEROSCOPY, DILATION AND CURETTAGE WITH endometrial ablation and laparoscopic bilateral salpingectomy 2. F/u in 6 weeks for post-op  Note: Pt given a [facility] endometrial ablation brochure, with review

## 2017-09-23 NOTE — H&P (View-Only) (Signed)
 [facility] Visit  09/11/2017            Patient name: Marisa Johnson   MRN [id number]  Date of birth: 04/08/1976  CC & HPI:  Marisa Johnson is a 41 y.o. female presenting today for discussion about tubal ligation for contraceptive management.  She desires permanent sterilization Techniques of sterilization reviewed, including Salpingectomy with it's risk benefit profile reviewed. She has no complaints today. Patient's last menstrual period was 09/04/2017 (exact date). She states her periods have been getting heavier recently. The heavy flow lasts three days for her. Her cramping during her periods has gotten worse as well. No alleviating factors noted. She states she has gotten headaches when she begins her periods. Endometrial ablation discussed pros and cons.  Patient is desirous of ablation as she finds the increasing discomfort of her periods progressively greater problem  ROS:  ROS  - fever - chills All systems are negative except as noted in the HPI and PMH.    Pertinent History Reviewed:   Reviewed: Significant for abnormal pap Medical             Past Medical History:  Diagnosis Date  . Anxiety   . Carpal tunnel syndrome   . Contraceptive management 08/09/2015  . Decreased libido 08/09/2015  . Friable cervix 08/09/2015  . History of abnormal cervical Pap smear 08/09/2015  . IBS (irritable bowel syndrome)   . Irregular intermenstrual bleeding 08/09/2015  . Major depressive disorder   . Migraine   . Mixed connective tissue disease (HCC)   . Pseudobulbar affect   . PTSD (post-traumatic stress disorder)   . Thyroid disease   . Vaginal Pap smear, abnormal                               Surgical Hx:         Past Surgical History:  Procedure Laterality Date  . DILATION AND CURETTAGE OF UTERUS N/A 07/25/2017   Procedure: SUCTION DILATATION AND CURETTAGE;  Surgeon: Turquoise Esch V, MD;  Location: [facility];  Service: Gynecology;  Laterality: N/A;   . ESOPHAGOGASTRODUODENOSCOPY N/A 11/17/2016   Procedure: ESOPHAGOGASTRODUODENOSCOPY (EGD);  Surgeon: Najeeb U Rehman, MD;  Location: [facility];  Service: Endoscopy;  Laterality: N/A;  . None     Medications: Reviewed & Updated - see associated section                       Current Outpatient Medications:  .  albuterol (PROVENTIL HFA;VENTOLIN HFA) 108 (90 Base) MCG/ACT inhaler, Inhale 2 puffs into the lungs every 6 (six) hours as needed for wheezing or shortness of breath., Disp: , Rfl:  .  buPROPion (WELLBUTRIN XL) 300 MG 24 hr tablet, Take 300 mg by mouth at bedtime., Disp: , Rfl:  .  clonazePAM (KLONOPIN) 0.5 MG tablet, Take 0.5 mg by mouth 2 (two) times daily. , Disp: , Rfl:  .  ibuprofen (ADVIL,MOTRIN) 600 MG tablet, Take 1 tablet (600 mg total) by mouth every 6 (six) hours as needed., Disp: 30 tablet, Rfl: 1 .  lamoTRIgine (LAMICTAL) 200 MG tablet, Take 300 mg by mouth at bedtime. , Disp: , Rfl:  .  levothyroxine (SYNTHROID, LEVOTHROID) 150 MCG tablet, Take 1 tablet by mouth daily., Disp: , Rfl:  .  linaclotide (LINZESS) 145 MCG CAPS capsule, Take 1 capsule (145 mcg total) daily before breakfast by mouth., Disp: 90   capsule, Rfl: 3 .  meclizine (ANTIVERT) 25 MG tablet, Take 25 mg by mouth 3 (three) times daily as needed for dizziness., Disp: , Rfl:  .  ondansetron (ZOFRAN) 4 MG tablet, Take 1 tablet (4 mg total) by mouth every 8 (eight) hours as needed for nausea or vomiting., Disp: 20 tablet, Rfl: 1 .  traMADol (ULTRAM) 50 MG tablet, Take 50-100 mg by mouth every 6 (six) hours as needed for moderate pain. , Disp: , Rfl:  .  traZODone (DESYREL) 100 MG tablet, Take 100 mg by mouth at bedtime., Disp: , Rfl:  .  HYDROcodone-acetaminophen (NORCO/VICODIN) 5-325 MG tablet, Take 1 tablet by mouth every 6 (six) hours as needed for moderate pain. (Patient not taking: Reported on 09/11/2017), Disp: 30 tablet, Rfl: 0   Social History: Reviewed -  reports that  has never smoked. she has never  used smokeless tobacco.  Objective Findings:  Vitals: Blood pressure 104/66, pulse 82, height 5' 3.5" (1.613 m), weight 184 lb (83.5 kg), last menstrual period 09/04/2017.  Physical Examination: General appearance - alert, well appearing, and in no distress Mental status - alert, oriented to person, place, and time Pelvic -  VULVA: normal appearing vulva with no masses, tenderness or lesions,  VAGINA: normal appearing vagina with normal color and discharge, no lesions,  CERVIX: normal appearing cervix without discharge or lesions,  UTERUS: tilts towards back, tiny, normal size, shape, consistency and nontender, a review of the ultrasound performed recently due to her pregnancy showed essentially normal uterus  ADNEXA: normal adnexa in size, nontender and no masses   Discussion: 1. Discussed with pt risks and benefits of tubal ligation for contraceptive management. The option of removing the tubes vs. clipping them was also discussed. Endometrial ablation was discussed as an option to manage pt's menstrual cycle.  At end of discussion, pt had opportunity to ask questions and has no further questions at this time.   Specific discussion as noted above. Greater than 50% was spent in counseling and coordination of care with the patient.   Total time greater than: 25 minutes.     Assessment & Plan:   A:  1. Contraceptive management discussed including pros and cons of salpingectomy versus tubal ligation with clips, with theoretic 50% reduction in risk of cancer in the tube and ovary area, lifetime risk reduction. 2. Physical assessment done for pre-op  P:  1. Schedule surgery for Dec 18 for HYSTEROSCOPY, DILATION AND CURETTAGE WITH endometrial ablation and laparoscopic bilateral salpingectomy 2. F/u in 6 weeks for post-op  Note: Pt given a [facility] endometrial ablation brochure, with review

## 2017-09-29 NOTE — H&P (Addendum)
[facility] Visit  09/11/2017            Patient name: Marisa Johnson   MRN [id number]  Date of birth: 07/29/1976  CC & HPI:  Marisa Johnson is a 41 y.o. female presenting today for discussion about tubal ligation for contraceptive management. She has no complaints today. Patient's last menstrual period was 09/04/2017 (exact date). She states her periods have been getting heavier recently. The heavy flow lasts three days for her. Her cramping during her periods has gotten worse as well. No alleviating factors noted. She states she has gotten headaches when she begins her periods. Endometrial ablation discussed pros and cons.  Patient is desirous of ablation as she finds the increasing discomfort of her periods progressively greater problem  ROS:  ROS  - fever - chills All systems are negative except as noted in the HPI and PMH.    Pertinent History Reviewed:   Reviewed: Significant for abnormal pap Medical             Past Medical History:  Diagnosis Date  . Anxiety   . Carpal tunnel syndrome   . Contraceptive management 08/09/2015  . Decreased libido 08/09/2015  . Friable cervix 08/09/2015  . History of abnormal cervical Pap smear 08/09/2015  . IBS (irritable bowel syndrome)   . Irregular intermenstrual bleeding 08/09/2015  . Major depressive disorder   . Migraine   . Mixed connective tissue disease (HCC)   . Pseudobulbar affect   . PTSD (post-traumatic stress disorder)   . Thyroid disease   . Vaginal Pap smear, abnormal                               Surgical Hx:         Past Surgical History:  Procedure Laterality Date  . DILATION AND CURETTAGE OF UTERUS N/A 07/25/2017   Procedure: SUCTION DILATATION AND CURETTAGE;  Surgeon: Tilda BurrowFerguson, Elizabethanne Lusher V, MD;  Location: [facility];  Service: Gynecology;  Laterality: N/A;  . ESOPHAGOGASTRODUODENOSCOPY N/A 11/17/2016   Procedure: ESOPHAGOGASTRODUODENOSCOPY (EGD);  Surgeon: Malissa HippoNajeeb U Rehman, MD;  Location: [facility];  Service: Endoscopy;  Laterality: N/A;  . None     Medications: Reviewed & Updated - see associated section                       Current Outpatient Medications:  .  albuterol (PROVENTIL HFA;VENTOLIN HFA) 108 (90 Base) MCG/ACT inhaler, Inhale 2 puffs into the lungs every 6 (six) hours as needed for wheezing or shortness of breath., Disp: , Rfl:  .  buPROPion (WELLBUTRIN XL) 300 MG 24 hr tablet, Take 300 mg by mouth at bedtime., Disp: , Rfl:  .  clonazePAM (KLONOPIN) 0.5 MG tablet, Take 0.5 mg by mouth 2 (two) times daily. , Disp: , Rfl:  .  ibuprofen (ADVIL,MOTRIN) 600 MG tablet, Take 1 tablet (600 mg total) by mouth every 6 (six) hours as needed., Disp: 30 tablet, Rfl: 1 .  lamoTRIgine (LAMICTAL) 200 MG tablet, Take 300 mg by mouth at bedtime. , Disp: , Rfl:  .  levothyroxine (SYNTHROID, LEVOTHROID) 150 MCG tablet, Take 1 tablet by mouth daily., Disp: , Rfl:  .  linaclotide (LINZESS) 145 MCG CAPS capsule, Take 1 capsule (145 mcg total) daily before breakfast by mouth., Disp: 90 capsule, Rfl: 3 .  meclizine (ANTIVERT) 25 MG tablet, Take 25 mg by mouth 3 (  three) times daily as needed for dizziness., Disp: , Rfl:  .  ondansetron (ZOFRAN) 4 MG tablet, Take 1 tablet (4 mg total) by mouth every 8 (eight) hours as needed for nausea or vomiting., Disp: 20 tablet, Rfl: 1 .  traMADol (ULTRAM) 50 MG tablet, Take 50-100 mg by mouth every 6 (six) hours as needed for moderate pain. , Disp: , Rfl:  .  traZODone (DESYREL) 100 MG tablet, Take 100 mg by mouth at bedtime., Disp: , Rfl:  .  HYDROcodone-acetaminophen (NORCO/VICODIN) 5-325 MG tablet, Take 1 tablet by mouth every 6 (six) hours as needed for moderate pain. (Patient not taking: Reported on 09/11/2017), Disp: 30 tablet, Rfl: 0   Social History: Reviewed -  reports that  has never smoked. she has never used smokeless tobacco.  Objective Findings:  Vitals: Blood pressure 104/66, pulse 82, height 5' 3.5" (1.613 m), weight 184 lb (83.5 kg),  last menstrual period 09/04/2017.  Physical Examination: General appearance - alert, well appearing, and in no distress Mental status - alert, oriented to person, place, and time Pelvic -  VULVA: normal appearing vulva with no masses, tenderness or lesions,  VAGINA: normal appearing vagina with normal color and discharge, no lesions,  CERVIX: normal appearing cervix without discharge or lesions,  UTERUS: tilts towards back, tiny, normal size, shape, consistency and nontender, a review of the ultrasound performed recently due to her pregnancy showed essentially normal uterus  ADNEXA: normal adnexa in size, nontender and no masses  CBC    Component Value Date/Time   WBC 6.6 09/23/2017 0753   RBC 4.49 09/23/2017 0753   HGB 12.9 09/23/2017 0753   HGB 13.5 07/25/2017 1238   HCT 40.7 09/23/2017 0753   HCT 42.4 07/25/2017 1238   PLT 245 09/23/2017 0753   PLT 300 07/25/2017 1238   MCV 90.6 09/23/2017 0753   MCV 91 07/25/2017 1238   MCH 28.7 09/23/2017 0753   MCHC 31.7 09/23/2017 0753   RDW 13.9 09/23/2017 0753   RDW 13.2 07/25/2017 1238   LYMPHSABS 4.6 (H) 07/25/2017 1238   MONOABS 0.5 05/03/2017 1041   EOSABS 0.2 07/25/2017 1238   BASOSABS 0.0 07/25/2017 1238   BMET    Component Value Date/Time   NA 136 09/23/2017 0753   K 3.6 09/23/2017 0753   CL 101 09/23/2017 0753   CO2 27 09/23/2017 0753   GLUCOSE 90 09/23/2017 0753   BUN 14 09/23/2017 0753   CREATININE 0.92 09/23/2017 0753   CALCIUM 9.5 09/23/2017 0753   GFRNONAA >60 09/23/2017 0753   GFRAA >60 09/23/2017 0753   Pregnancy test negative  Discussion: 1. Discussed with pt risks and benefits of tubal ligation for contraceptive management. The option of removing the tubes vs. clipping them was also discussed. Endometrial ablation was discussed as an option to manage pt's menstrual cycle.  At end of discussion, pt had opportunity to ask questions and has no further questions at this time.   Specific discussion as  noted above. Greater than 50% was spent in counseling and coordination of care with the patient.   Total time greater than: 25 minutes.     Assessment & Plan:   A:  1. Contraceptive management discussed including pros and cons of salpingectomy versus tubal ligation with clips, with theoretic 50% reduction in risk of cancer in the tube and ovary area, lifetime risk reduction. 2. Physical assessment done for pre-op  P:  1. Schedule surgery for Dec 18 for HYSTEROSCOPY, DILATION AND CURETTAGE WITH endometrial ablation  and laparoscopic bilateral salpingectomy 2. F/u in 6 weeks for post-op  Note: Pt given a [facility] endometrial ablation brochure, with review     By signing my name below, I, Izna Ahmed, attest that this documentation has been prepared under the direction and in the presence of Tilda BurrowFerguson, Raquan Iannone V, MD. Electronically Signed: Redge GainerIzna Ahmed, Medical Scribe. 09/11/17. 9:05 AM.  I personally performed the services described in this documentation, which was SCRIBED in my presence. The recorded information has been reviewed and considered accurate. It has been edited as necessary during review. Tilda BurrowJohn V Abrar Bilton, MD         Electronically signed by Tilda BurrowFerguson, Riverlyn Kizziah V, MD at 09/11/2017 9:37 AM Electronically signed by Tilda BurrowFerguson, Johna Kearl V, MD at 09/11/2017 9:40 AM     Chart is reviewed and procedure confirmed with Marisa Johnson this morning.  Questions invited and answered.  Will proceed with Laparoscopic bilateral Salpingectomy for sterilization, and then hysteroscopy D&C (if needed) and Endometrial ablation.  Tilda BurrowJohn V Khadeeja Elden, MD 09/30/17 8:45 am

## 2017-09-30 ENCOUNTER — Ambulatory Visit (HOSPITAL_COMMUNITY): Payer: 59 | Admitting: Anesthesiology

## 2017-09-30 ENCOUNTER — Encounter (HOSPITAL_COMMUNITY): Payer: Self-pay | Admitting: *Deleted

## 2017-09-30 ENCOUNTER — Ambulatory Visit (HOSPITAL_COMMUNITY)
Admission: RE | Admit: 2017-09-30 | Discharge: 2017-09-30 | Disposition: A | Discharge status: Home / Self Care | Payer: 59 | Source: Ambulatory Visit | Attending: Obstetrics and Gynecology | Admitting: Obstetrics and Gynecology

## 2017-09-30 ENCOUNTER — Encounter (HOSPITAL_COMMUNITY)
Admission: RE | Disposition: A | Discharge status: Home / Self Care | Payer: Self-pay | Source: Ambulatory Visit | Attending: Obstetrics and Gynecology

## 2017-09-30 DIAGNOSIS — F419 Anxiety disorder, unspecified: Secondary | ICD-10-CM | POA: Diagnosis not present

## 2017-09-30 DIAGNOSIS — Z79891 Long term (current) use of opiate analgesic: Secondary | ICD-10-CM | POA: Diagnosis not present

## 2017-09-30 DIAGNOSIS — E039 Hypothyroidism, unspecified: Secondary | ICD-10-CM | POA: Diagnosis not present

## 2017-09-30 DIAGNOSIS — N946 Dysmenorrhea, unspecified: Secondary | ICD-10-CM | POA: Insufficient documentation

## 2017-09-30 DIAGNOSIS — K589 Irritable bowel syndrome without diarrhea: Secondary | ICD-10-CM | POA: Insufficient documentation

## 2017-09-30 DIAGNOSIS — Z79899 Other long term (current) drug therapy: Secondary | ICD-10-CM | POA: Insufficient documentation

## 2017-09-30 DIAGNOSIS — Z79890 Hormone replacement therapy (postmenopausal): Secondary | ICD-10-CM | POA: Insufficient documentation

## 2017-09-30 DIAGNOSIS — Z302 Encounter for sterilization: Secondary | ICD-10-CM | POA: Insufficient documentation

## 2017-09-30 DIAGNOSIS — F329 Major depressive disorder, single episode, unspecified: Secondary | ICD-10-CM | POA: Diagnosis not present

## 2017-09-30 HISTORY — PX: DILITATION & CURRETTAGE/HYSTROSCOPY WITH NOVASURE ABLATION: SHX5568

## 2017-09-30 HISTORY — PX: LAPAROSCOPIC BILATERAL SALPINGECTOMY: SHX5889

## 2017-09-30 SURGERY — SALPINGECTOMY, BILATERAL, LAPAROSCOPIC
Anesthesia: General

## 2017-09-30 MED ORDER — MIDAZOLAM HCL 5 MG/5ML IJ SOLN
INTRAMUSCULAR | Status: DC | PRN
  Administered 2017-09-30: 2 mg via INTRAVENOUS

## 2017-09-30 MED ORDER — FENTANYL CITRATE (PF) 100 MCG/2ML IJ SOLN
25.0000 ug | INTRAMUSCULAR | Status: DC | PRN
  Administered 2017-09-30 (×4): 50 ug via INTRAVENOUS
  Filled 2017-09-30 (×2): qty 2

## 2017-09-30 MED ORDER — ROCURONIUM BROMIDE 100 MG/10ML IV SOLN
INTRAVENOUS | Status: DC | PRN
  Administered 2017-09-30: 5 mg via INTRAVENOUS
  Administered 2017-09-30 (×2): 10 mg via INTRAVENOUS
  Administered 2017-09-30: 25 mg via INTRAVENOUS

## 2017-09-30 MED ORDER — MIDAZOLAM HCL 2 MG/2ML IJ SOLN
INTRAMUSCULAR | Status: AC
  Filled 2017-09-30: qty 2

## 2017-09-30 MED ORDER — HYDROCODONE-ACETAMINOPHEN 5-325 MG PO TABS: 1.0000 | ORAL_TABLET | Freq: Four times a day (QID) | ORAL | 0 refills | Status: DC | PRN

## 2017-09-30 MED ORDER — PROPOFOL 10 MG/ML IV BOLUS
INTRAVENOUS | Status: AC
  Filled 2017-09-30: qty 40

## 2017-09-30 MED ORDER — MIDAZOLAM HCL 2 MG/2ML IJ SOLN
1.0000 mg | INTRAMUSCULAR | Status: AC
  Administered 2017-09-30: 2 mg via INTRAVENOUS

## 2017-09-30 MED ORDER — ONDANSETRON HCL 4 MG/2ML IJ SOLN
INTRAMUSCULAR | Status: AC
  Filled 2017-09-30: qty 2

## 2017-09-30 MED ORDER — SODIUM CHLORIDE 0.9 % IR SOLN
Status: DC | PRN
  Administered 2017-09-30: 3000 mL

## 2017-09-30 MED ORDER — SUGAMMADEX SODIUM 200 MG/2ML IV SOLN
INTRAVENOUS | Status: DC | PRN
  Administered 2017-09-30: 200 mg via INTRAVENOUS

## 2017-09-30 MED ORDER — CEFAZOLIN SODIUM-DEXTROSE 2-4 GM/100ML-% IV SOLN
2.0000 g | INTRAVENOUS | Status: AC
  Administered 2017-09-30: 2 g via INTRAVENOUS
  Filled 2017-09-30: qty 100

## 2017-09-30 MED ORDER — ROCURONIUM BROMIDE 50 MG/5ML IV SOLN
INTRAVENOUS | Status: AC
  Filled 2017-09-30: qty 2

## 2017-09-30 MED ORDER — LACTATED RINGERS IV SOLN
INTRAVENOUS | Status: DC
  Administered 2017-09-30 (×2): via INTRAVENOUS

## 2017-09-30 MED ORDER — LIDOCAINE HCL (PF) 1 % IJ SOLN
INTRAMUSCULAR | Status: AC
  Filled 2017-09-30: qty 5

## 2017-09-30 MED ORDER — 0.9 % SODIUM CHLORIDE (POUR BTL) OPTIME
TOPICAL | Status: DC | PRN
  Administered 2017-09-30: 1000 mL

## 2017-09-30 MED ORDER — FENTANYL CITRATE (PF) 100 MCG/2ML IJ SOLN
INTRAMUSCULAR | Status: AC
  Filled 2017-09-30: qty 4

## 2017-09-30 MED ORDER — ONDANSETRON HCL 4 MG/2ML IJ SOLN
4.0000 mg | Freq: Once | INTRAMUSCULAR | Status: AC
  Administered 2017-09-30: 4 mg via INTRAVENOUS

## 2017-09-30 MED ORDER — LIDOCAINE HCL 1 % IJ SOLN
INTRAMUSCULAR | Status: DC | PRN
  Administered 2017-09-30: 25 mg via INTRADERMAL

## 2017-09-30 MED ORDER — PROPOFOL 10 MG/ML IV BOLUS
INTRAVENOUS | Status: DC | PRN
  Administered 2017-09-30: 150 mg via INTRAVENOUS

## 2017-09-30 MED ORDER — BUPIVACAINE-EPINEPHRINE (PF) 0.5% -1:200000 IJ SOLN
INTRAMUSCULAR | Status: DC | PRN
  Administered 2017-09-30: 20 mL
  Administered 2017-09-30: 7 mL

## 2017-09-30 MED ORDER — FENTANYL CITRATE (PF) 100 MCG/2ML IJ SOLN
INTRAMUSCULAR | Status: DC | PRN
  Administered 2017-09-30: 100 ug via INTRAVENOUS
  Administered 2017-09-30 (×2): 50 ug via INTRAVENOUS

## 2017-09-30 MED ORDER — BUPIVACAINE-EPINEPHRINE (PF) 0.5% -1:200000 IJ SOLN
INTRAMUSCULAR | Status: AC
  Filled 2017-09-30: qty 30

## 2017-09-30 MED ORDER — KETOROLAC TROMETHAMINE 10 MG PO TABS: 10.0000 mg | ORAL_TABLET | Freq: Four times a day (QID) | ORAL | 0 refills | Status: DC | PRN

## 2017-09-30 SURGICAL SUPPLY — 49 items
ABLATOR ENDOMETRIAL BIPOLAR (ABLATOR) ×3 IMPLANT
BAG HAMPER (MISCELLANEOUS) ×3 IMPLANT
BANDAGE STRIP 1X3 FLEXIBLE (GAUZE/BANDAGES/DRESSINGS) ×9 IMPLANT
BLADE SURG SZ11 CARB STEEL (BLADE) ×3 IMPLANT
CATH ROBINSON RED A/P 16FR (CATHETERS) ×3 IMPLANT
CLOSURE STERI-STRIP 1/4X4 (GAUZE/BANDAGES/DRESSINGS) ×3 IMPLANT
CLOTH BEACON ORANGE TIMEOUT ST (SAFETY) ×3 IMPLANT
COVER LIGHT HANDLE STERIS (MISCELLANEOUS) ×6 IMPLANT
COVER MAYO STAND XLG (DRAPE) ×3 IMPLANT
DECANTER SPIKE VIAL GLASS SM (MISCELLANEOUS) ×3 IMPLANT
DURAPREP 26ML APPLICATOR (WOUND CARE) ×3 IMPLANT
ELECT REM PT RETURN 9FT ADLT (ELECTROSURGICAL) ×3
ELECTRODE REM PT RTRN 9FT ADLT (ELECTROSURGICAL) ×2 IMPLANT
FORMALIN 10 PREFIL 120ML (MISCELLANEOUS) ×3 IMPLANT
GLOVE BIOGEL PI IND STRL 7.0 (GLOVE) ×2 IMPLANT
GLOVE BIOGEL PI IND STRL 9 (GLOVE) ×2 IMPLANT
GLOVE BIOGEL PI INDICATOR 7.0 (GLOVE) ×1
GLOVE BIOGEL PI INDICATOR 9 (GLOVE) ×1
GLOVE ECLIPSE 9.0 STRL (GLOVE) ×6 IMPLANT
GOWN SPEC L3 XXLG W/TWL (GOWN DISPOSABLE) ×3 IMPLANT
GOWN STRL REUS W/TWL LRG LVL3 (GOWN DISPOSABLE) ×3 IMPLANT
INST SET HYSTEROSCOPY (KITS) ×3 IMPLANT
INST SET LAPROSCOPIC GYN AP (KITS) ×3 IMPLANT
IV NS IRRIG 3000ML ARTHROMATIC (IV SOLUTION) ×3 IMPLANT
KIT ROOM TURNOVER AP CYSTO (KITS) ×3 IMPLANT
KIT ROOM TURNOVER APOR (KITS) ×3 IMPLANT
MANIFOLD NEPTUNE II (INSTRUMENTS) ×3 IMPLANT
NEEDLE HYPO 25X1 1.5 SAFETY (NEEDLE) ×3 IMPLANT
NEEDLE INSUFFLATION 120MM (ENDOMECHANICALS) ×3 IMPLANT
NS IRRIG 1000ML POUR BTL (IV SOLUTION) ×3 IMPLANT
PACK PERI GYN (CUSTOM PROCEDURE TRAY) ×3 IMPLANT
PAD ARMBOARD 7.5X6 YLW CONV (MISCELLANEOUS) ×3 IMPLANT
PAD TELFA 3X4 1S STER (GAUZE/BANDAGES/DRESSINGS) ×3 IMPLANT
SET BASIN LINEN APH (SET/KITS/TRAYS/PACK) ×3 IMPLANT
SET IRRIG Y TYPE TUR BLADDER L (SET/KITS/TRAYS/PACK) ×3 IMPLANT
SHEARS HARMONIC ACE PLUS 36CM (ENDOMECHANICALS) ×3 IMPLANT
SLEEVE ENDOPATH XCEL 5M (ENDOMECHANICALS) ×6 IMPLANT
SOLUTION ANTI FOG 6CC (MISCELLANEOUS) ×3 IMPLANT
STRIP CLOSURE SKIN 1/4X3 (GAUZE/BANDAGES/DRESSINGS) ×3 IMPLANT
SUT VIC AB 4-0 PS2 27 (SUTURE) ×3 IMPLANT
SUT VICRYL 0 UR6 27IN ABS (SUTURE) ×6 IMPLANT
SYR 30ML LL (SYRINGE) ×3 IMPLANT
SYR BULB IRRIGATION 50ML (SYRINGE) ×3 IMPLANT
SYR CONTROL 10ML LL (SYRINGE) ×3 IMPLANT
SYRINGE 10CC LL (SYRINGE) ×3 IMPLANT
TROCAR XCEL BLUNT TIP 100MML (ENDOMECHANICALS) ×3 IMPLANT
TROCAR XCEL NON-BLD 5MMX100MML (ENDOMECHANICALS) ×3 IMPLANT
TUBING INSUFFLATION (TUBING) ×3 IMPLANT
WARMER LAPAROSCOPE (MISCELLANEOUS) ×3 IMPLANT

## 2017-09-30 NOTE — Anesthesia Procedure Notes (Signed)
Procedure Name: Intubation Date/Time: 09/30/2017 9:13 AM Performed by: Charmaine Downs, CRNA Pre-anesthesia Checklist: Patient identified, Patient being monitored, Timeout performed, Emergency Drugs available and Suction available Patient Re-evaluated:Patient Re-evaluated prior to induction Oxygen Delivery Method: Circle System Utilized Preoxygenation: Pre-oxygenation with 100% oxygen Induction Type: IV induction Ventilation: Mask ventilation without difficulty Laryngoscope Size: Mac and 3 Grade View: Grade II Tube type: Oral Tube size: 7.0 mm Number of attempts: 1 Airway Equipment and Method: stylet Placement Confirmation: ETT inserted through vocal cords under direct vision,  positive ETCO2 and breath sounds checked- equal and bilateral Secured at: 21 cm Tube secured with: Tape Dental Injury: Teeth and Oropharynx as per pre-operative assessment

## 2017-09-30 NOTE — Interval H&P Note (Signed)
History and Physical Interval Note:  09/30/2017 8:45 AM  Marisa Johnson  has presented today for surgery, with the diagnosis of Dysmenorrhea Sterilization  The various methods of treatment have been discussed with the patient and family. After consideration of risks, benefits and other options for treatment, the patient has consented to  Procedure(s): LAPAROSCOPIC BILATERAL SALPINGECTOMY (Bilateral) DILATATION & CURETTAGE/HYSTEROSCOPY WITH NOVASURE ABLATION (N/A) as a surgical intervention .  The patient's history has been reviewed, patient examined, no change in status, stable for surgery.  I have reviewed the patient's chart and labs.  Questions were answered to the patient's satisfaction.     Tilda BurrowJohn V Kahleb Mcclane

## 2017-09-30 NOTE — Op Note (Signed)
Please see the brief operative note for surgical details 

## 2017-09-30 NOTE — Anesthesia Preprocedure Evaluation (Signed)
Anesthesia Evaluation  Patient identified by MRN, date of birth, ID band Patient awake    Reviewed: Allergy & Precautions, NPO status , Patient's Chart, lab work & pertinent test results  Airway Mallampati: I  TM Distance: >3 FB Neck ROM: Full    Dental no notable dental hx. (+) Teeth Intact   Pulmonary neg pulmonary ROS,    breath sounds clear to auscultation       Cardiovascular negative cardio ROS   Rhythm:Regular Rate:Normal     Neuro/Psych  Headaches, PSYCHIATRIC DISORDERS (PTSD) Anxiety Depression    GI/Hepatic negative GI ROS, Neg liver ROS,   Endo/Other  Hypothyroidism   Renal/GU negative Renal ROS     Musculoskeletal   Abdominal   Peds  Hematology   Anesthesia Other Findings   Reproductive/Obstetrics Missed abortion                             Anesthesia Physical Anesthesia Plan  ASA: II  Anesthesia Plan: General   Post-op Pain Management:    Induction: Intravenous  PONV Risk Score and Plan:   Airway Management Planned: Oral ETT  Additional Equipment:   Intra-op Plan:   Post-operative Plan: Extubation in OR  Informed Consent: I have reviewed the patients History and Physical, chart, labs and discussed the procedure including the risks, benefits and alternatives for the proposed anesthesia with the patient or authorized representative who has indicated his/her understanding and acceptance.     Plan Discussed with:   Anesthesia Plan Comments:         Anesthesia Quick Evaluation

## 2017-09-30 NOTE — Discharge Instructions (Signed)
Laparoscopic Tubal Ligation, Care After Refer to this sheet in the next few weeks. These instructions provide you with information about caring for yourself after your procedure. Your health care provider may also give you more specific instructions. Your treatment has been planned according to current medical practices, but problems sometimes occur. Call your health care provider if you have any problems or questions after your procedure. What can I expect after the procedure? After the procedure, it is common to have:  A sore throat.  Discomfort in your shoulder.  Mild discomfort or cramping in your abdomen.  Gas pains.  Pain or soreness in the area where the surgical cut (incision) was made.  A bloated feeling.  Tiredness.  Nausea.  Vomiting.  Follow these instructions at home: Medicines  Take over-the-counter and prescription medicines only as told by your health care provider.  Do not take aspirin because it can cause bleeding.  Do not drive or operate heavy machinery while taking prescription pain medicine. Activity  Rest for the rest of the day.  Return to your normal activities as told by your health care provider. Ask your health care provider what activities are safe for you. Incision care   Follow instructions from your health care provider about how to take care of your incision. Make sure you: ? Wash your hands with soap and water before you change your bandage (dressing). If soap and water are not available, use hand sanitizer. ? Change your dressing as told by your health care provider. ? Leave stitches (sutures) in place. They may need to stay in place for 2 weeks or longer.  Check your incision area every day for signs of infection. Check for: ? More redness, swelling, or pain. ? More fluid or blood. ? Warmth. ? Pus or a bad smell. Other Instructions  Do not take baths, swim, or use a hot tub until your health care provider approves. You may take  showers.  Keep all follow-up visits as told by your health care provider. This is important.  Have someone help you with your daily household tasks for the first few days. Contact a health care provider if:  You have more redness, swelling, or pain around your incision.  Your incision feels warm to the touch.  You have pus or a bad smell coming from your incision.  The edges of your incision break open after the sutures have been removed.  Your pain does not improve after 2-3 days.  You have a rash.  You repeatedly become dizzy or light-headed.  Your pain medicine is not helping.  You are constipated. Get help right away if:  You have a fever.  You faint.  You have increasing pain in your abdomen.  You have severe pain in one or both of your shoulders.  You have fluid or blood coming from your sutures or from your vagina.  You have shortness of breath or difficulty breathing.  You have chest pain or leg pain.  You have ongoing nausea, vomiting, or diarrhea. This information is not intended to replace advice given to you by your health care provider. Make sure you discuss any questions you have with your health care provider. Document Released: 04/19/2005 Document Revised: 03/04/2016 Document Reviewed: 09/10/2015 Elsevier Interactive Patient Education  2018 [place]. Endometrial Ablation Endometrial ablation is a procedure that destroys the thin inner layer of the lining of the uterus (endometrium). This procedure may be done:  To stop heavy periods.  To stop bleeding that  is causing anemia.  To control irregular bleeding.  To treat bleeding caused by small tumors (fibroids) in the endometrium.  This procedure is often an alternative to major surgery, such as removal of the uterus and cervix (hysterectomy). As a result of this procedure:  You may not be able to have children. However, if you are premenopausal (you have not gone through menopause): ? You  may still have a small chance of getting pregnant. ? You will need to use a reliable method of birth control after the procedure to prevent pregnancy.  You may stop having a menstrual period, or you may have only a small amount of bleeding during your period. Menstruation may return several years after the procedure.  Tell a health care provider about:  Any allergies you have.  All medicines you are taking, including vitamins, herbs, eye drops, creams, and over-the-counter medicines.  Any problems you or family members have had with the use of anesthetic medicines.  Any blood disorders you have.  Any surgeries you have had.  Any medical conditions you have. What are the risks? Generally, this is a safe procedure. However, problems may occur, including:  A hole (perforation) in the uterus or bowel.  Infection of the uterus, bladder, or vagina.  Bleeding.  Damage to other structures or organs.  An air bubble in the lung (air embolus).  Problems with pregnancy after the procedure.  Failure of the procedure.  Decreased ability to diagnose cancer in the endometrium.  What happens before the procedure?  You will have tests of your endometrium to make sure there are no pre-cancerous cells or cancer cells present.  You may have an ultrasound of the uterus.  You may be given medicines to thin the endometrium.  Ask your health care provider about: ? Changing or stopping your regular medicines. This is especially important if you take diabetes medicines or blood thinners. ? Taking medicines such as aspirin and ibuprofen. These medicines can thin your blood. Do not take these medicines before your procedure if your doctor tells you not to.  Plan to have someone take you home from the hospital or clinic. What happens during the procedure?  You will lie on an exam table with your feet and legs supported as in a pelvic exam.  To lower your risk of infection: ? Your health care  team will wash or sanitize their hands and put on germ-free (sterile) gloves. ? Your genital area will be washed with soap.  An IV tube will be inserted into one of your veins.  You will be given a medicine to help you relax (sedative).  A surgical instrument with a light and camera (resectoscope) will be inserted into your vagina and moved into your uterus. This allows your surgeon to see inside your uterus.  Endometrial tissue will be removed using one of the following methods: ? Radiofrequency. This method uses a radiofrequency-alternating electric current to remove the endometrium. ? Cryotherapy. This method uses extreme cold to freeze the endometrium. ? Heated-free liquid. This method uses a heated saltwater (saline) solution to remove the endometrium. ? Microwave. This method uses high-energy microwaves to heat up the endometrium and remove it. ? Thermal balloon. This method involves inserting a catheter with a balloon tip into the uterus. The balloon tip is filled with heated fluid to remove the endometrium. The procedure may vary among health care providers and hospitals. What happens after the procedure?  Your blood pressure, heart rate, breathing rate, and blood oxygen  level will be monitored until the medicines you were given have worn off. °· As tissue healing occurs, you may notice vaginal bleeding for 4-6 weeks after the procedure. You may also experience: °? Cramps. °? Thin, watery vaginal discharge that is light pink or brown in color. °? A need to urinate more frequently than usual. °? Nausea. °· Do not drive for 24 hours if you were given a sedative. °· Do not have sex or insert anything into your vagina until your health care provider approves. °Summary °· Endometrial ablation is done to treat the many causes of heavy menstrual bleeding. °· The procedure may be done only after medications have been tried to control the bleeding. °· Plan to have someone take you home from the  hospital or clinic. °This information is not intended to replace advice given to you by your health care provider. Make sure you discuss any questions you have with your health care provider. °Document Released: 08/09/2004 Document Revised: 10/17/2016 Document Reviewed: 10/17/2016 °Elsevier Interactive Patient Education © 2017 Elsevier Inc. ° °

## 2017-09-30 NOTE — Anesthesia Postprocedure Evaluation (Signed)
Anesthesia Post Note  Patient: Marisa Johnson  Procedure(s) Performed: LAPAROSCOPIC BILATERAL SALPINGECTOMY (Bilateral ) DILATATION, HYSTEROSCOPY WITH NOVASURE ENDOMETRIAL ABLATION (N/A )  Patient location during evaluation: PACU Anesthesia Type: General Level of consciousness: awake and alert Pain management: pain level controlled Vital Signs Assessment: post-procedure vital signs reviewed and stable Respiratory status: spontaneous breathing, nonlabored ventilation and respiratory function stable Cardiovascular status: blood pressure returned to baseline Postop Assessment: no apparent nausea or vomiting Anesthetic complications: no     Last Vitals:  Vitals:   09/30/17 1140 09/30/17 1155  BP: 110/79 129/81  Pulse: 97 99  Resp: 12 16  Temp:  36.6 C  SpO2: 100% 98%    Last Pain:  Vitals:   09/30/17 1155  TempSrc: Oral  PainSc: 3                  Nakyia Dau J

## 2017-09-30 NOTE — Brief Op Note (Signed)
09/30/2017  11:17 AM  PATIENT:  Marisa Johnson  41 y.o. female  PRE-OPERATIVE DIAGNOSIS:  Dysmenorrhea Request Permanent Sterilization  POST-OPERATIVE DIAGNOSIS:  Dysmenorrhea Request Permanent Sterilization  PROCEDURE:  Procedure(s): LAPAROSCOPIC BILATERAL SALPINGECTOMY (Bilateral) DILATATION, HYSTEROSCOPY WITH NOVASURE ENDOMETRIAL ABLATION (N/A)  SURGEON:  Surgeon(s) and Role:    Tilda Burrow, MD - Primary  PHYSICIAN ASSISTANT:   ASSISTANTS: Laurena Bering CST  ANESTHESIA:   local and general, plus paracervical block  EBL:  15 mL   BLOOD ADMINISTERED:none  DRAINS: none   LOCAL MEDICATIONS USED:  MARCAINE    and Amount: 27 ml  SPECIMEN:  Source of Specimen:  Bilateral fallopian tubes  DISPOSITION OF SPECIMEN:  PATHOLOGY  COUNTS:  YES  TOURNIQUET:  * No tourniquets in log *  DICTATION: .Dragon Dictation  PLAN OF CARE: Discharge to home after PACU  PATIENT DISPOSITION:  PACU - hemodynamically stable.   Delay start of Pharmacological VTE agent (>24hrs) due to surgical blood loss or risk of bleeding: not applicable Details of procedure: Patient was taken operating for combined abdominal vaginal procedure.  A timeout was conducted.  With [facility] catheterization of the bladder and placement of a sponge stick in the vagina to manipulate the uterus if needed.  Attention was first directed to the umbilicus .  An infraumbilical vertical 1 cm skin incision was made with a transverse suprapubic and right lower quadrant incision.  All were 1 cm in length.  Veress needle was introduced through the umbilicus oriented toward the pelvis and efforts to achieve pneumoperitoneum were unsuccessful x2 even though there was a nice clean pop went through the fascia.  On the second attempt with enlarge the incision slightly and exposed the fascia directly and still there was difficulty insufflating.  Decision was made to proceed with open laparoscopy.  Allis clamps were used to grasp the  fascia which was elevated and opened transversely times 2 cm.  Preperitoneal fat was rather generous and then we had been some insufflation of the preperitoneal fat.  Blunt dissection through the preperitoneal fat was achieved with index finger efforts and then a Hassan cannula placed, and insufflation then was easily accomplished.  Abdomen was inspected there was no suspicion of injury to internal organs.  The preperitoneal fat was generous and flexible.  Attention was then directed to the pelvis the suprapubic and right lower quadrant trochars 5 mm in diameter were placed with direct visualization the camera.  Attention was directed to the pelvis the uterus is retroverted and somewhat mottled in overall appearance.  The round ligaments were quite relaxed.  The ovaries appeared grossly normal.  Photos were taken to document the pelvic anatomy.  Attention was then directed to the left fallopian tube which was elevated by grasping the round ligament at its insertion in the uterus with 1 clamp, elevating the uterus and using harmonic a 7 device to transect the fallopian tube and the mesosalpinx and extract the specimen.  Specimen was removed through the umbilical port due to the larger size of the Hassan cannula.  Attention was then directed to the right tube which was confirmed as to its fimbriated end and extracted after removal.  Fallopian tubes were removed completely and ovaries appeared normal and there was no intra-abdominal bleeding.  60 cc of saline was instilled in the abdomen, laparoscopic instruments and sleeves were removed, and the fascia closed at the umbilicus with running 0 Vicryl.  By the end of the case the insufflated carbon dioxide in  the preperitoneal fat was essentially reabsorbed. Subcuticular 4-0 Vicryl closure of the skin was done and completed the procedure abdominally.  Steri-Strips were placed and Band-Aids placed. Endometrial ablation. We did make note that the uterus was retroflexed  during the laparoscopic portion of the case.  Upon placing a speculum in the vagina the cervix was oriented somewhat anteriorly and the dilation of the endocervical canal was carefully done to take advantage of the knowledge of the retroverted position.  The uterus was sounded to 8 cm, dilated to 23 [place] and then the 30 degree rigid hysteroscope used and the endometrial cavity visualized and tubal ostia identified.  There was a small amount of endometrial tissue but appeared completely normal in appearance.  Curettage was not considered necessary given the symmetric normalcy of the appearance of the tissues. The NovaSure endometrial ablation device was then opened, inserted, with measurements 2.6 cm in width and 5.5 cm in length.  In order to ensure that the narrow width was properly seated we released the NovaSure device repeated the hysteroscopy confirmed that there was no evidence of perforation or extension into the myometrium, the end of the inserted the NovaSure ablation device and achieved the exact same measurements.  The NovaSure testing sequence was completed satisfactorily, and then the 1 minute 54 seconds ablation sequence completed.  Repeat hysteroscopy showed a appropriately devascularized endometrial lining with no suspicion of pockets that were not properly ablated. Endocervical canal had a healthy-appearing well vascularized tissue to indicate appropriate positioning of the NovaSure ablation device. Paracervical block had been applied at the onset of the ventral portion of the case and the tenaculum site on the posterior lip of the cervix was inspected and confirmed as hemostatic.  Patient went to recovery room will be discharged home today.

## 2017-09-30 NOTE — Transfer of Care (Signed)
Immediate Anesthesia Transfer of Care Note  Patient: Marisa Johnson  Procedure(s) Performed: LAPAROSCOPIC BILATERAL SALPINGECTOMY (Bilateral ) DILATATION, HYSTEROSCOPY WITH NOVASURE ENDOMETRIAL ABLATION (N/A )  Patient Location: PACU  Anesthesia Type:General  Level of Consciousness: awake and patient cooperative  Airway & Oxygen Therapy: Patient Spontanous Breathing and Patient connected to face mask oxygen  Post-op Assessment: Report given to RN, Post -op Vital signs reviewed and stable and Patient moving all extremities  Post vital signs: Reviewed and stable  Last Vitals:  Vitals:   09/30/17 0850 09/30/17 0855  BP: 119/82 124/78  Pulse:    Resp:    Temp:    SpO2:      Last Pain:  Vitals:   09/30/17 0819  TempSrc: Oral      Patients Stated Pain Goal: 5 (09/30/17 0819)  Complications: No apparent anesthesia complications

## 2017-10-01 ENCOUNTER — Encounter: Payer: Self-pay | Admitting: Obstetrics and Gynecology

## 2017-10-03 ENCOUNTER — Telehealth: Payer: Self-pay | Admitting: *Deleted

## 2017-10-03 NOTE — Telephone Encounter (Signed)
Patient called stating she was just starting to feel better but didn't know if she would be able to go back to work on Monday. Advised patient to see how the weekend goes and to call Dr Emelda FearFerguson per discharge instruction on Sunday depending on how she felt regarding returning to work later in the week.  Informed we would be open on Wednesday and to call then if needed. Verbalized understanding.

## 2017-10-08 ENCOUNTER — Encounter: Payer: Self-pay | Admitting: Obstetrics and Gynecology

## 2017-10-08 ENCOUNTER — Telehealth: Payer: Self-pay | Admitting: *Deleted

## 2017-10-08 NOTE — Telephone Encounter (Signed)
Pt given note re: return to work 10/09/17.

## 2017-10-09 DIAGNOSIS — Z0000 Encounter for general adult medical examination without abnormal findings: Secondary | ICD-10-CM | POA: Diagnosis not present

## 2017-10-09 DIAGNOSIS — E039 Hypothyroidism, unspecified: Secondary | ICD-10-CM | POA: Diagnosis not present

## 2017-10-15 DIAGNOSIS — Z029 Encounter for administrative examinations, unspecified: Secondary | ICD-10-CM

## 2017-10-22 ENCOUNTER — Encounter: Payer: 59 | Admitting: Obstetrics and Gynecology

## 2017-10-23 DIAGNOSIS — K224 Dyskinesia of esophagus: Secondary | ICD-10-CM | POA: Diagnosis not present

## 2017-10-23 DIAGNOSIS — F339 Major depressive disorder, recurrent, unspecified: Secondary | ICD-10-CM | POA: Diagnosis not present

## 2017-10-23 DIAGNOSIS — G608 Other hereditary and idiopathic neuropathies: Secondary | ICD-10-CM | POA: Diagnosis not present

## 2017-10-23 DIAGNOSIS — Z8759 Personal history of other complications of pregnancy, childbirth and the puerperium: Secondary | ICD-10-CM | POA: Diagnosis not present

## 2017-10-23 DIAGNOSIS — R202 Paresthesia of skin: Secondary | ICD-10-CM | POA: Diagnosis not present

## 2017-10-23 DIAGNOSIS — R0602 Shortness of breath: Secondary | ICD-10-CM | POA: Diagnosis not present

## 2017-10-23 DIAGNOSIS — E039 Hypothyroidism, unspecified: Secondary | ICD-10-CM | POA: Diagnosis not present

## 2017-10-23 DIAGNOSIS — F5101 Primary insomnia: Secondary | ICD-10-CM | POA: Diagnosis not present

## 2017-10-23 DIAGNOSIS — Z8701 Personal history of pneumonia (recurrent): Secondary | ICD-10-CM | POA: Diagnosis not present

## 2017-10-29 ENCOUNTER — Encounter: Payer: Self-pay | Admitting: Obstetrics and Gynecology

## 2017-10-29 ENCOUNTER — Ambulatory Visit (INDEPENDENT_AMBULATORY_CARE_PROVIDER_SITE_OTHER): Payer: 59 | Admitting: Obstetrics and Gynecology

## 2017-10-29 ENCOUNTER — Other Ambulatory Visit: Payer: Self-pay

## 2017-10-29 VITALS — BP 116/72 | HR 92 | Ht 63.0 in | Wt 182.6 lb

## 2017-10-29 DIAGNOSIS — Z4889 Encounter for other specified surgical aftercare: Secondary | ICD-10-CM

## 2017-10-29 DIAGNOSIS — Z9889 Other specified postprocedural states: Secondary | ICD-10-CM

## 2017-10-29 NOTE — Progress Notes (Signed)
Patient ID: Marisa DubinLori A Johnson, female   DOB: 1976/10/02, 42 y.o.   MRN: [id number]    Subjective:  Marisa DubinLori A Fausto is a 42 y.o. female now 4 weeks status post LAPAROSCOPIC BILATERAL SALPINGECTOMY and DILATATION, HYSTEROSCOPY WITH NOVASURE ENDOMETRIAL ABLATION.   She thinks her stomach is bloated from being constipated. She has been sexually active without a lot of discomfort.  Review of Systems Negative    Diet:   normal   Bowel movements : constipated.  The patient is not having any pain.  Objective:  BP 116/72 (BP Location: Right Arm, Patient Position: Sitting, Cuff Size: Normal)   Pulse 92   Ht 5\' 3"  (1.6 m)   Wt 182 lb 9.6 oz (82.8 kg)   BMI 32.35 kg/m  General:Well developed, well nourished.  No acute distress. Abdomen: Bowel sounds normal, soft, non-tender.  Status post extensive tanning, incisions well-healed Pelvic Exam:    External Genitalia:  Normal.    Vagina: Normal    Cervix: Normal    Uterus: Normal, tender    Adnexa/Bimanual: Normal  Incision(s):   Healing well, no drainage, no erythema, no hernia, no swelling, no dehiscence,     Assessment:  Post-Op 4 weeks s/p LAPAROSCOPIC BILATERAL SALPINGECTOMY and DILATATION, HYSTEROSCOPY WITH NOVASURE ENDOMETRIAL ABLATION    Doing well postoperatively.   Plan:  1.Wound care discussed   2. . current medications. 3. Activity restrictions: none 4. return to work: not applicable. 5. Follow up in PRN, 2 years  By signing my name below, I, Diona BrownerJennifer Gorman, attest that this documentation has been prepared under the direction and in the presence of Tilda BurrowFerguson, Maebelle Sulton V, MD. Electronically Signed: Diona BrownerJennifer Gorman, Medical Scribe. 10/29/17. 3:19 PM.  I personally performed the services described in this documentation, which was SCRIBED in my presence. The recorded information has been reviewed and considered accurate. It has been edited as necessary during review. Tilda BurrowJohn V Behr Cislo, MD

## 2017-11-05 DIAGNOSIS — F3181 Bipolar II disorder: Secondary | ICD-10-CM | POA: Diagnosis not present

## 2017-12-04 ENCOUNTER — Telehealth: Payer: Self-pay | Admitting: *Deleted

## 2017-12-04 DIAGNOSIS — E539 Vitamin B deficiency, unspecified: Secondary | ICD-10-CM | POA: Diagnosis not present

## 2017-12-04 DIAGNOSIS — E039 Hypothyroidism, unspecified: Secondary | ICD-10-CM | POA: Diagnosis not present

## 2017-12-04 NOTE — Telephone Encounter (Signed)
Patient states she has had 2 periods that have been heavy with small clots since her ablation. Headaches, nausea and cramping are back as well. Will discuss with Dr Emelda FearFerguson.

## 2017-12-04 NOTE — Telephone Encounter (Signed)
Spoke to Dr Emelda FearFerguson and he advised to give period 3 months in which she document length of period, how many pads she changes in a day and severity of pain. Advised to call us back if she feels its not better.  Pt verbalized understanding.

## 2017-12-06 ENCOUNTER — Encounter (HOSPITAL_COMMUNITY): Payer: Self-pay | Admitting: Emergency Medicine

## 2017-12-06 ENCOUNTER — Emergency Department (HOSPITAL_COMMUNITY)
Admission: EM | Admit: 2017-12-06 | Discharge: 2017-12-06 | Disposition: A | Discharge status: Home / Self Care | Payer: 59 | Attending: Emergency Medicine | Admitting: Emergency Medicine

## 2017-12-06 ENCOUNTER — Other Ambulatory Visit: Payer: Self-pay

## 2017-12-06 ENCOUNTER — Emergency Department (HOSPITAL_COMMUNITY): Payer: 59

## 2017-12-06 DIAGNOSIS — J984 Other disorders of lung: Secondary | ICD-10-CM | POA: Insufficient documentation

## 2017-12-06 DIAGNOSIS — Z79899 Other long term (current) drug therapy: Secondary | ICD-10-CM | POA: Diagnosis not present

## 2017-12-06 DIAGNOSIS — R0789 Other chest pain: Secondary | ICD-10-CM | POA: Insufficient documentation

## 2017-12-06 DIAGNOSIS — R079 Chest pain, unspecified: Secondary | ICD-10-CM | POA: Diagnosis present

## 2017-12-06 DIAGNOSIS — E876 Hypokalemia: Secondary | ICD-10-CM | POA: Insufficient documentation

## 2017-12-06 DIAGNOSIS — J988 Other specified respiratory disorders: Secondary | ICD-10-CM | POA: Diagnosis not present

## 2017-12-06 DIAGNOSIS — R0602 Shortness of breath: Secondary | ICD-10-CM | POA: Diagnosis not present

## 2017-12-06 DIAGNOSIS — M546 Pain in thoracic spine: Secondary | ICD-10-CM | POA: Diagnosis not present

## 2017-12-06 HISTORY — DX: Systemic involvement of connective tissue, unspecified: M35.9

## 2017-12-06 HISTORY — DX: Sjogren syndrome with lung involvement: M35.02

## 2017-12-06 LAB — URINALYSIS, ROUTINE W REFLEX MICROSCOPIC
Bilirubin Urine: NEGATIVE
Glucose, UA: NEGATIVE mg/dL
HGB URINE DIPSTICK: NEGATIVE
KETONES UR: NEGATIVE mg/dL
Leukocytes, UA: NEGATIVE
Nitrite: NEGATIVE
PROTEIN: NEGATIVE mg/dL
Specific Gravity, Urine: >1.046 — ABNORMAL HIGH (ref 1.005–1.030)
pH: 5 (ref 5.0–8.0)

## 2017-12-06 LAB — COMPREHENSIVE METABOLIC PANEL
ALK PHOS: 68 U/L (ref 38–126)
ALT: 15 U/L (ref 14–54)
ANION GAP: 10 (ref 5–15)
AST: 20 U/L (ref 15–41)
Albumin: 4.2 g/dL (ref 3.5–5.0)
BILIRUBIN TOTAL: 0.2 mg/dL — AB (ref 0.3–1.2)
BUN: 10 mg/dL (ref 6–20)
CALCIUM: 9 mg/dL (ref 8.9–10.3)
CO2: 23 mmol/L (ref 22–32)
Chloride: 108 mmol/L (ref 101–111)
Creatinine, Ser: 0.92 mg/dL (ref 0.44–1.00)
GFR calc non Af Amer: >60 mL/min (ref >60)
Glucose, Bld: 93 mg/dL (ref 65–99)
Potassium: 3.3 mmol/L — ABNORMAL LOW (ref 3.5–5.1)
Sodium: 141 mmol/L (ref 135–145)
TOTAL PROTEIN: 7 g/dL (ref 6.5–8.1)

## 2017-12-06 LAB — CBC WITH DIFFERENTIAL/PLATELET
BASOS ABS: 0 10*3/uL (ref 0.0–0.1)
Basophils Relative: 1 %
EOS ABS: 0.1 10*3/uL (ref 0.0–0.7)
EOS PCT: 2 %
HCT: 39.9 % (ref 36.0–46.0)
Hemoglobin: 13 g/dL (ref 12.0–15.0)
Lymphocytes Relative: 63 %
Lymphs Abs: 3.1 10*3/uL (ref 0.7–4.0)
MCH: 29 pg (ref 26.0–34.0)
MCHC: 32.6 g/dL (ref 30.0–36.0)
MCV: 89.1 fL (ref 78.0–100.0)
Monocytes Absolute: 0.4 10*3/uL (ref 0.1–1.0)
Monocytes Relative: 9 %
Neutro Abs: 1.2 10*3/uL — ABNORMAL LOW (ref 1.7–7.7)
Neutrophils Relative %: 25 %
PLATELETS: 236 10*3/uL (ref 150–400)
RBC: 4.48 MIL/uL (ref 3.87–5.11)
RDW: 13.2 % (ref 11.5–15.5)
WBC: 4.9 10*3/uL (ref 4.0–10.5)

## 2017-12-06 LAB — LIPASE, BLOOD: Lipase: 24 U/L (ref 11–51)

## 2017-12-06 LAB — TROPONIN I

## 2017-12-06 LAB — PREGNANCY, URINE: PREG TEST UR: NEGATIVE

## 2017-12-06 MED ORDER — KETOROLAC TROMETHAMINE 30 MG/ML IJ SOLN
30.0000 mg | Freq: Once | INTRAMUSCULAR | Status: AC
  Administered 2017-12-06: 30 mg via INTRAVENOUS
  Filled 2017-12-06: qty 1

## 2017-12-06 MED ORDER — HYDROCODONE-ACETAMINOPHEN 5-325 MG PO TABS
1.0000 | ORAL_TABLET | Freq: Once | ORAL | Status: AC
  Administered 2017-12-06: 1 via ORAL
  Filled 2017-12-06: qty 1

## 2017-12-06 MED ORDER — PREDNISONE 20 MG PO TABS: 40.0000 mg | ORAL_TABLET | Freq: Every day | ORAL | 0 refills | Status: DC

## 2017-12-06 MED ORDER — PREDNISONE 10 MG PO TABS
60.0000 mg | ORAL_TABLET | Freq: Once | ORAL | Status: AC
  Administered 2017-12-06: 60 mg via ORAL
  Filled 2017-12-06: qty 1

## 2017-12-06 MED ORDER — HYDROCODONE-ACETAMINOPHEN 5-325 MG PO TABS: ORAL_TABLET | ORAL | 0 refills | Status: DC

## 2017-12-06 MED ORDER — IOPAMIDOL (ISOVUE-370) INJECTION 76%
100.0000 mL | Freq: Once | INTRAVENOUS | Status: AC | PRN
  Administered 2017-12-06: 100 mL via INTRAVENOUS

## 2017-12-06 MED ORDER — POTASSIUM CHLORIDE CRYS ER 20 MEQ PO TBCR
40.0000 meq | EXTENDED_RELEASE_TABLET | Freq: Once | ORAL | Status: AC
  Administered 2017-12-06: 40 meq via ORAL
  Filled 2017-12-06: qty 2

## 2017-12-06 NOTE — ED Triage Notes (Addendum)
Pt reports shortness of breath,headache, chest soreness, upper back pain and chills for last several days.pt afebrile, fatigue, and nausea.

## 2017-12-06 NOTE — Discharge Instructions (Signed)
Take the prescriptions as directed.  Apply moist heat or ice to the area(s) of discomfort, for 15 minutes at a time, several times per day for the next few days.  Do not fall asleep on a heating or ice pack. Use your albuterol inhaler (2 to 4 puffs) every 4 hours for the next 7 days, then as needed for cough, wheezing, or shortness of breath.  Call your regular medical doctor Monday morning to schedule a follow up appointment within the next 3 days.  Call your Rheumatologist on Monday to schedule a follow up appointment within the next week.  Return to the Emergency Department immediately sooner if worsening.

## 2017-12-06 NOTE — ED Notes (Signed)
Pt transported to CT ?

## 2017-12-06 NOTE — ED Provider Notes (Signed)
[facility] EMERGENCY DEPARTMENT Provider Note   CSN: [id number] Arrival date & time: 12/06/17  1710     History   Chief Complaint Chief Complaint  Patient presents with  . Shortness of Breath    HPI Marisa Johnson is a 42 y.o. female.   Shortness of Breath     Pt was seen at 1735. Per pt, c/o gradual onset and worsening of persistent mid-sternal chest "pain" that began 1 week ago, worsened over the past 2 days. Pain radiates into her upper back. Has been associated with SOB. Symptoms worsen with deep breath. CP improves with palpation of the area. Has been associated with chills and nausea. Pt states her symptoms began after she "got over the flu." Denies palpitations, no objective fevers, no rash, no abd pain, no vomiting/diarrhea, no palpitations, no cough, no injury.   Past Medical History:  Diagnosis Date  . Anxiety   . Carpal tunnel syndrome   . Connective tissue disorder (HCC)   . Contraceptive management 08/09/2015  . Decreased libido 08/09/2015  . Friable cervix 08/09/2015  . History of abnormal cervical Pap smear 08/09/2015  . Hypothyroidism   . IBS (irritable bowel syndrome)   . Irregular intermenstrual bleeding 08/09/2015  . Major depressive disorder   . Migraine   . Mixed connective tissue disease (HCC)   . Pseudobulbar affect   . PTSD (post-traumatic stress disorder)   . Sjogren's syndrome with lung involvement (HCC)   . Thyroid disease   . Vaginal Pap smear, abnormal     Patient Active Problem List   Diagnosis Date Noted  . History of abortion 07/25/2017  . Endometritis 07/25/2017  . Pelvic pain 07/25/2017  . Dyspnea 05/03/2017  . SOB (shortness of breath) 05/03/2017  . 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  . Sjogren's syndrome with lung involvement (HCC) 03/15/2017  . Irregular intermenstrual bleeding 08/09/2015  . Decreased libido 08/09/2015  . Friable cervix 08/09/2015  . Contraceptive management 08/09/2015  . History of abnormal  cervical Pap smear 08/09/2015  . Abdominal pain, left lower quadrant 09/21/2013  . Nausea and vomiting 09/20/2013  . Irritable bowel syndrome 05/29/2010  . CLOSED FRACTURE OF HEAD OF RADIUS 04/13/2008  . FX CLOSED FIBULA NOS 04/13/2008    Past Surgical History:  Procedure Laterality Date  . DILATION AND CURETTAGE OF UTERUS N/A 07/25/2017   Procedure: SUCTION DILATATION AND CURETTAGE;  Surgeon: Tilda Burrow, MD;  Location: [facility];  Service: Gynecology;  Laterality: N/A;  . DILITATION & CURRETTAGE/HYSTROSCOPY WITH NOVASURE ABLATION N/A 09/30/2017   Procedure: DILATATION, HYSTEROSCOPY WITH NOVASURE ENDOMETRIAL ABLATION;  Surgeon: Tilda Burrow, MD;  Location: [facility];  Service: Gynecology;  Laterality: N/A;  . ESOPHAGOGASTRODUODENOSCOPY N/A 11/17/2016   Procedure: ESOPHAGOGASTRODUODENOSCOPY (EGD);  Surgeon: Malissa Hippo, MD;  Location: [facility];  Service: Endoscopy;  Laterality: N/A;  . LAPAROSCOPIC BILATERAL SALPINGECTOMY Bilateral 09/30/2017   Procedure: LAPAROSCOPIC BILATERAL SALPINGECTOMY;  Surgeon: Tilda Burrow, MD;  Location: [facility];  Service: Gynecology;  Laterality: Bilateral;  . None      OB History    Gravida Para Term Preterm AB Living   5 3 3   2 3    SAB TAB Ectopic Multiple Live Births   1 1             Home Medications    Prior to Admission medications   Medication Sig Start Date End Date Taking? Authorizing Provider  albuterol (PROVENTIL HFA;VENTOLIN HFA) 108 (90 Base) MCG/ACT inhaler Inhale 2 puffs  into the lungs every 6 (six) hours as needed for wheezing or shortness of breath.    [provider]  buPROPion (WELLBUTRIN XL) 300 MG 24 hr tablet Take 300 mg by mouth every evening.     [provider]  clonazePAM (KLONOPIN) 0.5 MG tablet Take 0.5 mg by mouth 2 (two) times daily.     [provider]  gabapentin (NEURONTIN) 600 MG tablet Take 600 mg by mouth 3 (three) times daily as needed (takes 1 at bedtime).  08/05/17    [provider]  HYDROcodone-acetaminophen (NORCO/VICODIN) 5-325 MG tablet Take 1 tablet by mouth every 6 (six) hours as needed for moderate pain. Patient not taking: Reported on 10/29/2017 09/30/17   Tilda Burrow, MD  ibuprofen (ADVIL,MOTRIN) 200 MG tablet Take 800 mg by mouth every 8 (eight) hours as needed for mild pain.    [provider]  ketorolac (TORADOL) 10 MG tablet Take 1 tablet (10 mg total) by mouth every 6 (six) hours as needed (five day limit postop). Patient not taking: Reported on 10/29/2017 09/30/17   Tilda Burrow, MD  lamoTRIgine (LAMICTAL) 200 MG tablet Take 300 mg by mouth at bedtime. Takes 1.5 tablet    [provider]  levothyroxine (SYNTHROID, LEVOTHROID) 150 MCG tablet Take 1 tablet by mouth daily. 04/23/17   [provider]  linaclotide Karlene Einstein) 145 MCG CAPS capsule Take 1 capsule (145 mcg total) daily before breakfast by mouth. 09/01/17   Setzer, Brand Males, NP  ondansetron (ZOFRAN) 4 MG tablet Take 1 tablet (4 mg total) by mouth every 8 (eight) hours as needed for nausea or vomiting. 07/17/17   Elvina Sidle, MD  oxymetazoline (VICKS SINEX SEVERE DECONGEST) 0.05 % nasal spray Place 2 sprays into both nostrils 2 (two) times daily as needed for congestion.    [provider]  rizatriptan (MAXALT) 10 MG tablet Take 10 mg by mouth as needed for migraine. May repeat in 2 hours if needed    [provider]  traMADol (ULTRAM) 50 MG tablet Take 50-100 mg by mouth every 6 (six) hours as needed for moderate pain (depends on pain if takes 1-2 tablets).     [provider]  traZODone (DESYREL) 100 MG tablet Take 100 mg by mouth at bedtime.    [provider]    Family History Family History  Problem Relation Age of Onset  . Diabetes Mother   . Hypertension Mother   . Hypertension Father   . Stroke Father   . Cancer Father        prostate  . Cancer Maternal Grandmother   . Kidney disease Paternal  Grandmother   . Hypertension Paternal Grandmother   . Hyperlipidemia Paternal Grandmother   . Heart disease Paternal Grandmother   . Heart attack Paternal Grandmother   . Diabetes Daughter   . Hypothyroidism Daughter   . Anxiety disorder Sister   . Anxiety disorder Brother   . Anxiety disorder Sister   . Colon cancer Neg Hx     Social History Social History   Tobacco Use  . Smoking status: Never Smoker  . Smokeless tobacco: Never Used  . Tobacco comment: Never smoker  Substance Use Topics  . Alcohol use: Yes    Comment: occ.  . Drug use: No     Allergies   Abilify [aripiprazole]; Reglan [metoclopramide]; and Dexamethasone   Review of Systems Review of Systems  Respiratory: Positive for shortness of breath.   ROS: Statement: All systems  negative except as marked or noted in the HPI; Constitutional: Negative for fever and chills. ; ; Eyes: Negative for eye pain, redness and discharge. ; ; ENMT: Negative for ear pain, hoarseness, nasal congestion, sinus pressure and sore throat. ; ; Cardiovascular: +CP, SOB. Negative for palpitations, diaphoresis, and peripheral edema. ; ; Respiratory: Negative for cough, wheezing and stridor. ; ; Gastrointestinal: Negative for nausea, vomiting, diarrhea, abdominal pain, blood in stool, hematemesis, jaundice and rectal bleeding. . ; ; Genitourinary: Negative for dysuria, flank pain and hematuria. ; ; Musculoskeletal: +back pain. Negative for neck pain. Negative for swelling and trauma.; ; Skin: Negative for pruritus, rash, abrasions, blisters, bruising and skin lesion.; ; Neuro: Negative for headache, lightheadedness and neck stiffness. Negative for weakness, altered level of consciousness, altered mental status, extremity weakness, paresthesias, involuntary movement, seizure and syncope.      Physical Exam Updated Vital Signs BP 122/79 (BP Location: Right Arm)   Pulse 85   Temp 98.2 F (36.8 C) (Oral)   Resp 18   Ht 5\' 3"  (1.6 m)   Wt 81.6  kg (180 lb)   SpO2 98%   BMI 31.89 kg/m   Physical Exam 1740: Physical examination:  Nursing notes reviewed; Vital signs and O2 SAT reviewed;  Constitutional: Well developed, Well nourished, Well hydrated, In no acute distress; Head:  Normocephalic, atraumatic; Eyes: EOMI, PERRL, No scleral icterus; ENMT: Mouth and pharynx normal, Mucous membranes moist; Neck: Supple, Full range of motion, No lymphadenopathy; Cardiovascular: Regular rate and rhythm, No gallop; Respiratory: Breath sounds clear & equal bilaterally, No wheezes.  Speaking full sentences with ease, Normal respiratory effort/excursion; Chest: +bilat parasternal areas tender to palp. No soft tissue crepitus. No deformity. Movement normal; Abdomen: Soft, Nontender, Nondistended, Normal bowel sounds; Genitourinary: No CVA tenderness; Extremities: Pulses normal, No tenderness, No edema, No calf edema or asymmetry.; Neuro: AA&Ox3, Major CN grossly intact.  Speech clear. No gross focal motor or sensory deficits in extremities. Climbs on and off stretcher easily by herself. Gait steady..; Skin: Color normal, Warm, Dry.    ED Treatments / Results  Labs (all labs ordered are listed, but only abnormal results are displayed)   EKG  EKG Interpretation  Date/Time:  Saturday December 06 2017 18:09:49 EST Ventricular Rate:  82 PR Interval:    QRS Duration: 105 QT Interval:  397 QTC Calculation: 464 R Axis:   -26 Text Interpretation:  Sinus rhythm Borderline left axis deviation Low voltage, precordial leads Abnormal R-wave progression, late transition Borderline T abnormalities, anterior leads Baseline wander Artifact When compared with ECG of 03/16/2017 Rate slower Artifact is now Present Confirmed by Samuel Jester [phone]) on 12/06/2017 7:01:08 PM       Radiology   Procedures Procedures (including critical care time)  Medications Ordered in ED Medications - No data to display   Initial Impression / Assessment and Plan / ED  Course  I have reviewed the triage vital signs and the nursing notes.  Pertinent labs & imaging results that were available during my care of the patient were reviewed by me and considered in my medical decision making (see chart for details).  MDM Reviewed: previous chart, nursing note and vitals Reviewed previous: labs and ECG Interpretation: labs, ECG and CT scan   Results for orders placed or performed during the hospital encounter of 12/06/17  Comprehensive metabolic panel  Result Value Ref Range   Sodium 141 135 - 145 mmol/L   Potassium 3.3 (L) 3.5 - 5.1 mmol/L   Chloride 108  101 - 111 mmol/L   CO2 23 22 - 32 mmol/L   Glucose, Bld 93 65 - 99 mg/dL   BUN 10 6 - 20 mg/dL   Creatinine, Ser 4.250.92 0.44 - 1.00 mg/dL   Calcium 9.0 8.9 - 95.610.3 mg/dL   Total Protein 7.0 6.5 - 8.1 g/dL   Albumin 4.2 3.5 - 5.0 g/dL   AST 20 15 - 41 U/L   ALT 15 14 - 54 U/L   Alkaline Phosphatase 68 38 - 126 U/L   Total Bilirubin 0.2 (L) 0.3 - 1.2 mg/dL   GFR calc non Af Amer >60 >60 mL/min   GFR calc Af Amer >60 >60 mL/min   Anion gap 10 5 - 15  Lipase, blood  Result Value Ref Range   Lipase 24 11 - 51 U/L  Troponin I  Result Value Ref Range   Troponin I <0.03 <0.03 ng/mL  CBC with Differential  Result Value Ref Range   WBC 4.9 4.0 - 10.5 K/uL   RBC 4.48 3.87 - 5.11 MIL/uL   Hemoglobin 13.0 12.0 - 15.0 g/dL   HCT 38.739.9 56.436.0 - 33.246.0 %   MCV 89.1 78.0 - 100.0 fL   MCH 29.0 26.0 - 34.0 pg   MCHC 32.6 30.0 - 36.0 g/dL   RDW 95.113.2 88.411.5 - 16.615.5 %   Platelets 236 150 - 400 K/uL   Neutrophils Relative % 25 %   Neutro Abs 1.2 (L) 1.7 - 7.7 K/uL   Lymphocytes Relative 63 %   Lymphs Abs 3.1 0.7 - 4.0 K/uL   Monocytes Relative 9 %   Monocytes Absolute 0.4 0.1 - 1.0 K/uL   Eosinophils Relative 2 %   Eosinophils Absolute 0.1 0.0 - 0.7 K/uL   Basophils Relative 1 %   Basophils Absolute 0.0 0.0 - 0.1 K/uL  Urinalysis, Routine w reflex microscopic  Result Value Ref Range   Color, Urine YELLOW YELLOW     APPearance CLEAR CLEAR   Specific Gravity, Urine >1.046 (H) 1.005 - 1.030   pH 5.0 5.0 - 8.0   Glucose, UA NEGATIVE NEGATIVE mg/dL   Hgb urine dipstick NEGATIVE NEGATIVE   Bilirubin Urine NEGATIVE NEGATIVE   Ketones, ur NEGATIVE NEGATIVE mg/dL   Protein, ur NEGATIVE NEGATIVE mg/dL   Nitrite NEGATIVE NEGATIVE   Leukocytes, UA NEGATIVE NEGATIVE  Pregnancy, urine  Result Value Ref Range   Preg Test, Ur NEGATIVE NEGATIVE   Ct Angio Chest Pe W/cm &/or Wo Cm Result Date: 12/06/2017 CLINICAL DATA:  42 year old female with acute shortness of breath and chest pain. EXAM: CT ANGIOGRAPHY CHEST WITH CONTRAST TECHNIQUE: Multidetector CT imaging of the chest was performed using the standard protocol during bolus administration of intravenous contrast. Multiplanar CT image reconstructions and MIPs were obtained to evaluate the vascular anatomy. CONTRAST:  100mL ISOVUE-370 IOPAMIDOL (ISOVUE-370) INJECTION 76% COMPARISON:  05/04/2017 and prior CTs FINDINGS: Cardiovascular: This is a technically satisfactory study. No pulmonary emboli are identified. Heart size normal. No thoracic aortic aneurysm or pericardial effusion. Mediastinum/Nodes: No enlarged mediastinal, hilar, or axillary lymph nodes. Thyroid gland, trachea, and esophagus demonstrate no significant findings. Lungs/Pleura: A trace LEFT pleural effusion is noted. Mild central and RIGHT LOWER lobe ground-glass opacities noted mild central and RIGHT LOWER lobe ground-glass opacities which are nonspecific. No evidence of airspace disease, mass, consolidation or pneumothorax. Upper Abdomen: No acute abnormality. Musculoskeletal: No acute or suspicious abnormality. Review of the MIP images confirms the above findings. IMPRESSION: 1. No evidence of pulmonary emboli. 2. Mild  nonspecific central and RIGHT LOWER lobe ground-glass opacities. This may be related to small airway/small-vessel disease, edema or nonspecific inflammation. Infection is considered less  likely. 3. Trace LEFT pleural effusion again noted. Electronically Signed   By: Harmon Pier M.D.   On: 12/06/2017 18:59    1740:  Pt states previously when she had these symptoms, she was dx with pneumonia from a CT scan "because the XR didn't show it." Given hx, PMHx, cc/HPI, and pt preference, will obtain CT.   2010:  Potassium repleted PO. Pt has tol PO well without N/V. CT as above; doubt infection with normal WBC count and without fever. Will tx prednisone, albuterol MDI, f/u Rheum MD. Dx and testing d/w pt and family.  Questions answered.  Verb understanding, agreeable to d/c home with outpt f/u.    Final Clinical Impressions(s) / ED Diagnoses   Final diagnoses:  None    ED Discharge Orders    None        Samuel Jester, DO 12/09/17 1229

## 2017-12-07 DIAGNOSIS — R7981 Abnormal blood-gas level: Secondary | ICD-10-CM | POA: Diagnosis not present

## 2017-12-07 DIAGNOSIS — M3500 Sicca syndrome, unspecified: Secondary | ICD-10-CM | POA: Diagnosis not present

## 2017-12-07 DIAGNOSIS — R0602 Shortness of breath: Secondary | ICD-10-CM | POA: Diagnosis not present

## 2017-12-07 DIAGNOSIS — R0789 Other chest pain: Secondary | ICD-10-CM | POA: Diagnosis not present

## 2017-12-07 DIAGNOSIS — R51 Headache: Secondary | ICD-10-CM | POA: Diagnosis not present

## 2017-12-07 DIAGNOSIS — Z888 Allergy status to other drugs, medicaments and biological substances status: Secondary | ICD-10-CM | POA: Diagnosis not present

## 2017-12-08 DIAGNOSIS — R9431 Abnormal electrocardiogram [ECG] [EKG]: Secondary | ICD-10-CM | POA: Diagnosis not present

## 2017-12-09 DIAGNOSIS — F5101 Primary insomnia: Secondary | ICD-10-CM | POA: Diagnosis not present

## 2017-12-09 DIAGNOSIS — Z8759 Personal history of other complications of pregnancy, childbirth and the puerperium: Secondary | ICD-10-CM | POA: Diagnosis not present

## 2017-12-09 DIAGNOSIS — R0602 Shortness of breath: Secondary | ICD-10-CM | POA: Diagnosis not present

## 2017-12-09 DIAGNOSIS — K224 Dyskinesia of esophagus: Secondary | ICD-10-CM | POA: Diagnosis not present

## 2017-12-09 DIAGNOSIS — G608 Other hereditary and idiopathic neuropathies: Secondary | ICD-10-CM | POA: Diagnosis not present

## 2017-12-09 DIAGNOSIS — Z6830 Body mass index (BMI) 30.0-30.9, adult: Secondary | ICD-10-CM | POA: Diagnosis not present

## 2017-12-09 DIAGNOSIS — E039 Hypothyroidism, unspecified: Secondary | ICD-10-CM | POA: Diagnosis not present

## 2017-12-09 DIAGNOSIS — F339 Major depressive disorder, recurrent, unspecified: Secondary | ICD-10-CM | POA: Diagnosis not present

## 2017-12-09 DIAGNOSIS — Z8701 Personal history of pneumonia (recurrent): Secondary | ICD-10-CM | POA: Diagnosis not present

## 2017-12-30 DIAGNOSIS — F411 Generalized anxiety disorder: Secondary | ICD-10-CM | POA: Diagnosis not present

## 2017-12-30 DIAGNOSIS — F3181 Bipolar II disorder: Secondary | ICD-10-CM | POA: Diagnosis not present

## 2018-01-16 ENCOUNTER — Telehealth: Payer: Self-pay | Admitting: *Deleted

## 2018-01-16 NOTE — Telephone Encounter (Signed)
States period started 3/24 and lasted until 4/2.  Stopped for a couple of days and is now spotting again. She is also experiencing pain in her left lower quadrant.  Advised patient to get appointment to be seen for Dr Emelda FearFerguson to discuss and evaluate.  Verbalized understanding and appointment made.

## 2018-01-19 ENCOUNTER — Ambulatory Visit: Payer: 59 | Admitting: Obstetrics and Gynecology

## 2018-01-21 ENCOUNTER — Telehealth: Payer: Self-pay | Admitting: *Deleted

## 2018-01-21 ENCOUNTER — Ambulatory Visit (INDEPENDENT_AMBULATORY_CARE_PROVIDER_SITE_OTHER): Payer: 59 | Admitting: Obstetrics and Gynecology

## 2018-01-21 ENCOUNTER — Encounter: Payer: Self-pay | Admitting: Obstetrics and Gynecology

## 2018-01-21 VITALS — BP 120/90 | HR 79 | Ht 63.0 in | Wt 182.5 lb

## 2018-01-21 DIAGNOSIS — N930 Postcoital and contact bleeding: Secondary | ICD-10-CM

## 2018-01-21 DIAGNOSIS — R1032 Left lower quadrant pain: Secondary | ICD-10-CM

## 2018-01-21 DIAGNOSIS — N72 Inflammatory disease of cervix uteri: Secondary | ICD-10-CM

## 2018-01-21 NOTE — Progress Notes (Signed)
[facility] Visit  01/21/2018            Patient name: Marisa Johnson MRN [id number]  Date of birth: 08-03-1976  CC & HPI:  Marisa Johnson is a 42 y.o. female presenting today for vaginal bleeding after an endometrial ablation. Her last period began on 3/24 and lasted until 4/2. She spotted for a few days, and this has continued to occur. She has associated symptoms of LLQ pain. She has had two other periods that have been heavy with clots, after her ablation. No alleviating factors noted. Pt has not tried any medications for relief.  She states she has been having issues at work. If she isn't able to get to the bathroom within an hour and a half, she soaks through her pants. She says if she strains too hard while defecated, she will bleed. Her ablation was on 09/30/17.  ROS:  ROS (+) vaginal bleeding (+) LLQ pain All systems are negative except as noted in the HPI and PMH.   Pertinent History Reviewed:   Reviewed: Significant for endometrial ablation, friable cervix Medical         Past Medical History:  Diagnosis Date  . Anxiety   . Carpal tunnel syndrome   . Connective tissue disorder (HCC)   . Contraceptive management 08/09/2015  . Decreased libido 08/09/2015  . Friable cervix 08/09/2015  . History of abnormal cervical Pap smear 08/09/2015  . Hypothyroidism   . IBS (irritable bowel syndrome)   . Irregular intermenstrual bleeding 08/09/2015  . Major depressive disorder   . Migraine   . Mixed connective tissue disease (HCC)   . Pseudobulbar affect   . PTSD (post-traumatic stress disorder)   . Sjogren's syndrome with lung involvement (HCC)   . Thyroid disease   . Vaginal Pap smear, abnormal                               Surgical Hx:    Past Surgical History:  Procedure Laterality Date  . DILATION AND CURETTAGE OF UTERUS N/A 07/25/2017   Procedure: SUCTION DILATATION AND CURETTAGE;  Surgeon: Tilda Burrow, MD;  Location: [facility];  Service: Gynecology;   Laterality: N/A;  . DILITATION & CURRETTAGE/HYSTROSCOPY WITH NOVASURE ABLATION N/A 09/30/2017   Procedure: DILATATION, HYSTEROSCOPY WITH NOVASURE ENDOMETRIAL ABLATION;  Surgeon: Tilda Burrow, MD;  Location: [facility];  Service: Gynecology;  Laterality: N/A;  . ESOPHAGOGASTRODUODENOSCOPY N/A 11/17/2016   Procedure: ESOPHAGOGASTRODUODENOSCOPY (EGD);  Surgeon: Malissa Hippo, MD;  Location: [facility];  Service: Endoscopy;  Laterality: N/A;  . LAPAROSCOPIC BILATERAL SALPINGECTOMY Bilateral 09/30/2017   Procedure: LAPAROSCOPIC BILATERAL SALPINGECTOMY;  Surgeon: Tilda Burrow, MD;  Location: [facility];  Service: Gynecology;  Laterality: Bilateral;  . None     Medications: Reviewed & Updated - see associated section                       Current Outpatient Medications:  .  buPROPion (WELLBUTRIN XL) 300 MG 24 hr tablet, Take 300 mg by mouth every evening. , Disp: , Rfl:  .  clonazePAM (KLONOPIN) 0.5 MG tablet, Take 0.5 mg by mouth 2 (two) times daily. , Disp: , Rfl:  .  ibuprofen (ADVIL,MOTRIN) 200 MG tablet, Take 800 mg by mouth every 8 (eight) hours as needed for mild pain., Disp: , Rfl:  .  lamoTRIgine (LAMICTAL) 200 MG tablet, Take 300 mg  by mouth at bedtime. Takes 1.5 tablet, Disp: , Rfl:  .  levothyroxine (SYNTHROID, LEVOTHROID) 137 MCG tablet, Take 137 mcg by mouth daily before breakfast., Disp: , Rfl:  .  linaclotide (LINZESS) 145 MCG CAPS capsule, Take 1 capsule (145 mcg total) daily before breakfast by mouth. (Patient taking differently: Take 145 mcg by mouth as needed. ), Disp: 90 capsule, Rfl: 3 .  ondansetron (ZOFRAN) 4 MG tablet, Take 1 tablet (4 mg total) by mouth every 8 (eight) hours as needed for nausea or vomiting., Disp: 20 tablet, Rfl: 1 .  rizatriptan (MAXALT) 10 MG tablet, Take 10 mg by mouth as needed for migraine. May repeat in 2 hours if needed, Disp: , Rfl:  .  traMADol (ULTRAM) 50 MG tablet, Take 50-100 mg by mouth every 6 (six) hours as needed for moderate pain (depends  on pain if takes 1-2 tablets). , Disp: , Rfl:  .  traZODone (DESYREL) 100 MG tablet, Take 100 mg by mouth at bedtime., Disp: , Rfl:    Social History: Reviewed -  reports that she has never smoked. She has never used smokeless tobacco.  Objective Findings:  Vitals: Blood pressure 120/90, pulse 79, height 5\' 3"  (1.6 m), weight 182 lb 8 oz (82.8 kg), last menstrual period 01/01/2018.  PHYSICAL EXAMINATION General appearance - alert, well appearing, and in no distress and oriented to person, place, and time Mental status - alert, oriented to person, place, and time, normal mood, behavior, speech, dress, motor activity, and thought processes  PELVIC External genitalia - normal Vagina - normal Cervix - bleeding upon palpation. Silver nitrate applied to cervix of note, Pap smears are up-to-date normal Pap smear 2016 and previously 2013 Uterus - normal  Adnexa - normal   Assessment & Plan:   A:  1.  Pois-coital bleeding secondary to cervicitis, treated with silver nitrate  P:  1.  Will have pt come back in 3 weeks for consideration of cervical biopsy if any bleeding persists  By signing my name below, I, Marisa Johnson, attest that this documentation has been prepared under the direction and in the presence of Tilda BurrowFerguson, Caleel Kiner V, MD. Electronically Signed: Redge GainerIzna Johnson, Medical Scribe. 01/21/18. 4:23 PM.  I personally performed the services described in this documentation, which was SCRIBED in my presence. The recorded information has been reviewed and considered accurate. It has been edited as necessary during review. Tilda BurrowJohn V Royce Sciara, MD

## 2018-01-22 NOTE — Telephone Encounter (Signed)
LMOVM returning patient's call.  

## 2018-02-02 ENCOUNTER — Encounter: Payer: Self-pay | Admitting: Obstetrics and Gynecology

## 2018-02-11 ENCOUNTER — Encounter: Payer: Self-pay | Admitting: Obstetrics and Gynecology

## 2018-02-11 ENCOUNTER — Other Ambulatory Visit: Payer: Self-pay | Admitting: Obstetrics and Gynecology

## 2018-02-11 ENCOUNTER — Ambulatory Visit (INDEPENDENT_AMBULATORY_CARE_PROVIDER_SITE_OTHER): Payer: 59 | Admitting: Obstetrics and Gynecology

## 2018-02-11 VITALS — BP 122/90 | HR 81 | Ht 63.0 in | Wt 181.4 lb

## 2018-02-11 DIAGNOSIS — N72 Inflammatory disease of cervix uteri: Secondary | ICD-10-CM | POA: Diagnosis not present

## 2018-02-11 NOTE — Addendum Note (Signed)
Addended by: Colen Darling on: 02/11/2018 11:24 AM   Modules accepted: Orders

## 2018-02-11 NOTE — Progress Notes (Signed)
Patient ID: Marisa Johnson, female   DOB: 1976/07/08, 42 y.o.   MRN: [id number]  Chief Complaint  Patient presents with  . cervical biopsy    DARNESHIA DEMARY 42 y.o. [id number] here for a cervical biopsy for vaginal bleeding after an endometrial ablation. Her last pap smear was on 08/09/2015 and was normal.  Patient given informed consent, signed copy in the chart, time out was performed.  Placed in lithotomy position. Cervix viewed with speculum and colposcope after application of acetic acid.   EXAM/ PROCEDURE: Cervical Biopsies obtained at 10:00 and 2:00. geneorus endocervical bleeding upon contact with biopsy instrument. All specimens were labelled and sent to pathology.   Cervical Biopsy IMPRESSION Cervicitis,  / PLAN:  1. Patient was given post procedure instructions. 2. Cervicitis, treated topically with monsels solution 3. Vaginal bleeding secondary to Cervicitis 4. F/u with results by phone and she will F/u in 4 weeks   By signing my name below, I, Diona Browner, attest that this documentation has been prepared under the direction and in the presence of Tilda Burrow, MD. Electronically Signed: Diona Browner, Medical Scribe. 02/11/18. 9:56 AM.  I personally performed the services described in this documentation, which was SCRIBED in my presence. The recorded information has been reviewed and considered accurate. It has been edited as necessary during review. Tilda Burrow, MD

## 2018-02-17 ENCOUNTER — Telehealth: Payer: Self-pay | Admitting: Obstetrics and Gynecology

## 2018-02-17 DIAGNOSIS — F3181 Bipolar II disorder: Secondary | ICD-10-CM | POA: Diagnosis not present

## 2018-02-17 DIAGNOSIS — F411 Generalized anxiety disorder: Secondary | ICD-10-CM | POA: Diagnosis not present

## 2018-02-17 NOTE — Telephone Encounter (Signed)
Pt called requesting results of biopsy. DOB verified. Informed no malignancy or abnormality. Pt verbalized understanding.

## 2018-02-18 DIAGNOSIS — Z79899 Other long term (current) drug therapy: Secondary | ICD-10-CM | POA: Diagnosis not present

## 2018-02-18 DIAGNOSIS — M797 Fibromyalgia: Secondary | ICD-10-CM | POA: Diagnosis not present

## 2018-02-18 DIAGNOSIS — G43009 Migraine without aura, not intractable, without status migrainosus: Secondary | ICD-10-CM | POA: Diagnosis not present

## 2018-02-18 DIAGNOSIS — G608 Other hereditary and idiopathic neuropathies: Secondary | ICD-10-CM | POA: Diagnosis not present

## 2018-02-22 ENCOUNTER — Other Ambulatory Visit: Payer: Self-pay | Admitting: Obstetrics and Gynecology

## 2018-02-22 MED ORDER — METRONIDAZOLE 0.75 % VA GEL: 1.0000 | Freq: Every day | VAGINAL | 1 refills | Status: DC

## 2018-02-22 NOTE — Progress Notes (Signed)
Metrogel escribed for cervicitis.

## 2018-02-24 ENCOUNTER — Encounter: Payer: Self-pay | Admitting: *Deleted

## 2018-02-28 DIAGNOSIS — Z6830 Body mass index (BMI) 30.0-30.9, adult: Secondary | ICD-10-CM | POA: Diagnosis not present

## 2018-02-28 DIAGNOSIS — E039 Hypothyroidism, unspecified: Secondary | ICD-10-CM | POA: Diagnosis not present

## 2018-02-28 DIAGNOSIS — K224 Dyskinesia of esophagus: Secondary | ICD-10-CM | POA: Diagnosis not present

## 2018-02-28 DIAGNOSIS — Z8701 Personal history of pneumonia (recurrent): Secondary | ICD-10-CM | POA: Diagnosis not present

## 2018-02-28 DIAGNOSIS — R0602 Shortness of breath: Secondary | ICD-10-CM | POA: Diagnosis not present

## 2018-02-28 DIAGNOSIS — G608 Other hereditary and idiopathic neuropathies: Secondary | ICD-10-CM | POA: Diagnosis not present

## 2018-02-28 DIAGNOSIS — Z8759 Personal history of other complications of pregnancy, childbirth and the puerperium: Secondary | ICD-10-CM | POA: Diagnosis not present

## 2018-02-28 DIAGNOSIS — Z6832 Body mass index (BMI) 32.0-32.9, adult: Secondary | ICD-10-CM | POA: Diagnosis not present

## 2018-02-28 DIAGNOSIS — M5431 Sciatica, right side: Secondary | ICD-10-CM | POA: Diagnosis not present

## 2018-03-06 DIAGNOSIS — G608 Other hereditary and idiopathic neuropathies: Secondary | ICD-10-CM | POA: Diagnosis not present

## 2018-03-06 DIAGNOSIS — F5101 Primary insomnia: Secondary | ICD-10-CM | POA: Diagnosis not present

## 2018-03-06 DIAGNOSIS — K224 Dyskinesia of esophagus: Secondary | ICD-10-CM | POA: Diagnosis not present

## 2018-03-06 DIAGNOSIS — Z8701 Personal history of pneumonia (recurrent): Secondary | ICD-10-CM | POA: Diagnosis not present

## 2018-03-06 DIAGNOSIS — R0602 Shortness of breath: Secondary | ICD-10-CM | POA: Diagnosis not present

## 2018-03-06 DIAGNOSIS — F339 Major depressive disorder, recurrent, unspecified: Secondary | ICD-10-CM | POA: Diagnosis not present

## 2018-03-06 DIAGNOSIS — E039 Hypothyroidism, unspecified: Secondary | ICD-10-CM | POA: Diagnosis not present

## 2018-03-06 DIAGNOSIS — Z6830 Body mass index (BMI) 30.0-30.9, adult: Secondary | ICD-10-CM | POA: Diagnosis not present

## 2018-03-06 DIAGNOSIS — Z8759 Personal history of other complications of pregnancy, childbirth and the puerperium: Secondary | ICD-10-CM | POA: Diagnosis not present

## 2018-03-12 DIAGNOSIS — Z8701 Personal history of pneumonia (recurrent): Secondary | ICD-10-CM | POA: Diagnosis not present

## 2018-03-12 DIAGNOSIS — J309 Allergic rhinitis, unspecified: Secondary | ICD-10-CM | POA: Diagnosis not present

## 2018-03-12 DIAGNOSIS — E782 Mixed hyperlipidemia: Secondary | ICD-10-CM | POA: Diagnosis not present

## 2018-03-12 DIAGNOSIS — F339 Major depressive disorder, recurrent, unspecified: Secondary | ICD-10-CM | POA: Diagnosis not present

## 2018-03-12 DIAGNOSIS — R202 Paresthesia of skin: Secondary | ICD-10-CM | POA: Diagnosis not present

## 2018-03-12 DIAGNOSIS — K224 Dyskinesia of esophagus: Secondary | ICD-10-CM | POA: Diagnosis not present

## 2018-03-12 DIAGNOSIS — E039 Hypothyroidism, unspecified: Secondary | ICD-10-CM | POA: Diagnosis not present

## 2018-03-12 DIAGNOSIS — G608 Other hereditary and idiopathic neuropathies: Secondary | ICD-10-CM | POA: Diagnosis not present

## 2018-03-12 DIAGNOSIS — F5101 Primary insomnia: Secondary | ICD-10-CM | POA: Diagnosis not present

## 2018-03-23 DIAGNOSIS — F411 Generalized anxiety disorder: Secondary | ICD-10-CM | POA: Diagnosis not present

## 2018-03-23 DIAGNOSIS — F3181 Bipolar II disorder: Secondary | ICD-10-CM | POA: Diagnosis not present

## 2018-03-24 DIAGNOSIS — H52221 Regular astigmatism, right eye: Secondary | ICD-10-CM | POA: Diagnosis not present

## 2018-03-24 DIAGNOSIS — H5213 Myopia, bilateral: Secondary | ICD-10-CM | POA: Diagnosis not present

## 2018-03-24 DIAGNOSIS — H524 Presbyopia: Secondary | ICD-10-CM | POA: Diagnosis not present

## 2018-03-31 ENCOUNTER — Other Ambulatory Visit: Payer: Self-pay

## 2018-03-31 ENCOUNTER — Encounter (HOSPITAL_BASED_OUTPATIENT_CLINIC_OR_DEPARTMENT_OTHER): Payer: Self-pay | Admitting: Adult Health

## 2018-03-31 ENCOUNTER — Emergency Department (HOSPITAL_BASED_OUTPATIENT_CLINIC_OR_DEPARTMENT_OTHER)
Admission: EM | Admit: 2018-03-31 | Discharge: 2018-03-31 | Disposition: A | Discharge status: Home / Self Care | Payer: 59 | Attending: Emergency Medicine | Admitting: Emergency Medicine

## 2018-03-31 DIAGNOSIS — F329 Major depressive disorder, single episode, unspecified: Secondary | ICD-10-CM | POA: Diagnosis not present

## 2018-03-31 DIAGNOSIS — R197 Diarrhea, unspecified: Secondary | ICD-10-CM | POA: Insufficient documentation

## 2018-03-31 DIAGNOSIS — Z79899 Other long term (current) drug therapy: Secondary | ICD-10-CM | POA: Diagnosis not present

## 2018-03-31 DIAGNOSIS — E039 Hypothyroidism, unspecified: Secondary | ICD-10-CM | POA: Diagnosis not present

## 2018-03-31 DIAGNOSIS — R1084 Generalized abdominal pain: Secondary | ICD-10-CM | POA: Insufficient documentation

## 2018-03-31 DIAGNOSIS — R1010 Upper abdominal pain, unspecified: Secondary | ICD-10-CM | POA: Diagnosis present

## 2018-03-31 DIAGNOSIS — F419 Anxiety disorder, unspecified: Secondary | ICD-10-CM | POA: Diagnosis not present

## 2018-03-31 LAB — COMPREHENSIVE METABOLIC PANEL
ALT: 14 U/L (ref 14–54)
ANION GAP: 7 (ref 5–15)
AST: 18 U/L (ref 15–41)
Albumin: 3.9 g/dL (ref 3.5–5.0)
Alkaline Phosphatase: 66 U/L (ref 38–126)
BUN: 9 mg/dL (ref 6–20)
CHLORIDE: 103 mmol/L (ref 101–111)
CO2: 27 mmol/L (ref 22–32)
Calcium: 8.3 mg/dL — ABNORMAL LOW (ref 8.9–10.3)
Creatinine, Ser: 0.84 mg/dL (ref 0.44–1.00)
GFR calc non Af Amer: >60 mL/min (ref >60)
Glucose, Bld: 91 mg/dL (ref 65–99)
POTASSIUM: 3.1 mmol/L — AB (ref 3.5–5.1)
Sodium: 137 mmol/L (ref 135–145)
Total Bilirubin: 0.5 mg/dL (ref 0.3–1.2)
Total Protein: 6.6 g/dL (ref 6.5–8.1)

## 2018-03-31 LAB — LIPASE, BLOOD: LIPASE: 24 U/L (ref 11–51)

## 2018-03-31 LAB — URINALYSIS, MICROSCOPIC (REFLEX): WBC UA: NONE SEEN WBC/hpf (ref 0–5)

## 2018-03-31 LAB — CBC
HCT: 37.3 % (ref 36.0–46.0)
HEMOGLOBIN: 12.5 g/dL (ref 12.0–15.0)
MCH: 29.6 pg (ref 26.0–34.0)
MCHC: 33.5 g/dL (ref 30.0–36.0)
MCV: 88.4 fL (ref 78.0–100.0)
Platelets: 318 10*3/uL (ref 150–400)
RBC: 4.22 MIL/uL (ref 3.87–5.11)
RDW: 13.3 % (ref 11.5–15.5)
WBC: 7 10*3/uL (ref 4.0–10.5)

## 2018-03-31 LAB — URINALYSIS, ROUTINE W REFLEX MICROSCOPIC
Bilirubin Urine: NEGATIVE
GLUCOSE, UA: NEGATIVE mg/dL
Ketones, ur: NEGATIVE mg/dL
LEUKOCYTES UA: NEGATIVE
Nitrite: NEGATIVE
Protein, ur: NEGATIVE mg/dL
SPECIFIC GRAVITY, URINE: 1.015 (ref 1.005–1.030)
pH: 6 (ref 5.0–8.0)

## 2018-03-31 LAB — PREGNANCY, URINE: PREG TEST UR: NEGATIVE

## 2018-03-31 MED ORDER — DICYCLOMINE HCL 20 MG PO TABS: 20.0000 mg | ORAL_TABLET | Freq: Three times a day (TID) | ORAL | 0 refills | Status: DC | PRN

## 2018-03-31 MED ORDER — SODIUM CHLORIDE 0.9 % IV BOLUS
1000.0000 mL | Freq: Once | INTRAVENOUS | Status: AC
  Administered 2018-03-31: 1000 mL via INTRAVENOUS

## 2018-03-31 MED ORDER — DICYCLOMINE HCL 10 MG/ML IM SOLN
10.0000 mg | Freq: Once | INTRAMUSCULAR | Status: AC
  Administered 2018-03-31: 10 mg via INTRAMUSCULAR
  Filled 2018-03-31: qty 2

## 2018-03-31 NOTE — ED Triage Notes (Signed)
PResents with upper abdominal pain that is intermittent worse with eating and associated with nausea and diarrhea. PAin began Sunday and is worsening.

## 2018-03-31 NOTE — ED Provider Notes (Signed)
[facility] EMERGENCY DEPARTMENT Provider Note   CSN: [id number] Arrival date & time: 03/31/18  1731     History   Chief Complaint Chief Complaint  Patient presents with  . Abdominal Pain    HPI Marisa Johnson is a 42 y.o. female.  HPI Patient presents with upper abdominal pain.  Has had for last couple days.  Has had nausea and has a large amount of diarrhea.  No sick contacts.  Has had somewhat decreased appetite.  Patient states the pain is dull.  States she is starting a new job.  States she is taking Imodium and is decreased her diarrhea.  No fevers. Past Medical History:  Diagnosis Date  . Anxiety   . Carpal tunnel syndrome   . Connective tissue disorder (HCC)   . Contraceptive management 08/09/2015  . Decreased libido 08/09/2015  . Friable cervix 08/09/2015  . History of abnormal cervical Pap smear 08/09/2015  . Hypothyroidism   . IBS (irritable bowel syndrome)   . Irregular intermenstrual bleeding 08/09/2015  . Major depressive disorder   . Migraine   . Mixed connective tissue disease (HCC)   . Pseudobulbar affect   . PTSD (post-traumatic stress disorder)   . Sjogren's syndrome with lung involvement (HCC)   . Thyroid disease   . Vaginal Pap smear, abnormal     Patient Active Problem List   Diagnosis Date Noted  . Cervicitis and endocervicitis 02/11/2018  . History of abortion 07/25/2017  . Endometritis 07/25/2017  . Pelvic pain 07/25/2017  . Dyspnea 05/03/2017  . SOB (shortness of breath) 05/03/2017  . 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  . Sjogren's syndrome with lung involvement (HCC) 03/15/2017  . Irregular intermenstrual bleeding 08/09/2015  . Decreased libido 08/09/2015  . Friable cervix 08/09/2015  . Contraceptive management 08/09/2015  . History of abnormal cervical Pap smear 08/09/2015  . Abdominal pain, left lower quadrant 09/21/2013  . Nausea and vomiting 09/20/2013  . Irritable bowel syndrome 05/29/2010  . CLOSED FRACTURE OF  HEAD OF RADIUS 04/13/2008  . FX CLOSED FIBULA NOS 04/13/2008    Past Surgical History:  Procedure Laterality Date  . DILATION AND CURETTAGE OF UTERUS N/A 07/25/2017   Procedure: SUCTION DILATATION AND CURETTAGE;  Surgeon: Tilda Burrow, MD;  Location: [facility];  Service: Gynecology;  Laterality: N/A;  . DILITATION & CURRETTAGE/HYSTROSCOPY WITH NOVASURE ABLATION N/A 09/30/2017   Procedure: DILATATION, HYSTEROSCOPY WITH NOVASURE ENDOMETRIAL ABLATION;  Surgeon: Tilda Burrow, MD;  Location: [facility];  Service: Gynecology;  Laterality: N/A;  . ESOPHAGOGASTRODUODENOSCOPY N/A 11/17/2016   Procedure: ESOPHAGOGASTRODUODENOSCOPY (EGD);  Surgeon: Malissa Hippo, MD;  Location: [facility];  Service: Endoscopy;  Laterality: N/A;  . LAPAROSCOPIC BILATERAL SALPINGECTOMY Bilateral 09/30/2017   Procedure: LAPAROSCOPIC BILATERAL SALPINGECTOMY;  Surgeon: Tilda Burrow, MD;  Location: [facility];  Service: Gynecology;  Laterality: Bilateral;  . None       OB History    Gravida  5   Para  3   Term  3   Preterm      AB  2   Living  3     SAB  1   TAB  1   Ectopic      Multiple      Live Births               Home Medications    Prior to Admission medications   Medication Sig Start Date End Date Taking? Authorizing Provider  Brexpiprazole (REXULTI) 0.5 MG TABS Take  by mouth.   Yes [provider]  buPROPion (WELLBUTRIN XL) 300 MG 24 hr tablet Take 300 mg by mouth every evening.    Yes [provider]  clonazePAM (KLONOPIN) 0.5 MG tablet Take 0.5 mg by mouth 2 (two) times daily.    Yes [provider]  ibuprofen (ADVIL,MOTRIN) 200 MG tablet Take 800 mg by mouth every 8 (eight) hours as needed for mild pain.   Yes [provider]  lamoTRIgine (LAMICTAL) 200 MG tablet Take 300 mg by mouth at bedtime. Takes 1.5 tablet   Yes [provider]  levothyroxine (SYNTHROID, LEVOTHROID) 137 MCG tablet Take 150 mcg by mouth daily before breakfast.     Yes [provider]  linaclotide (LINZESS) 145 MCG CAPS capsule Take 1 capsule (145 mcg total) daily before breakfast by mouth. Patient taking differently: Take 145 mcg by mouth as needed.  09/01/17  Yes Setzer, Terri L, NP  metroNIDAZOLE (METROGEL) 0.75 % vaginal gel Place 1 Applicatorful vaginally at bedtime. Apply one applicatorful to vagina at bedtime twice weekly for cervical inflammation 02/22/18  Yes Tilda BurrowFerguson, John V, MD  ondansetron (ZOFRAN) 4 MG tablet Take 1 tablet (4 mg total) by mouth every 8 (eight) hours as needed for nausea or vomiting. 07/17/17  Yes Elvina SidleLauenstein, Kurt, MD  rizatriptan (MAXALT) 10 MG tablet Take 10 mg by mouth as needed for migraine. May repeat in 2 hours if needed   Yes [provider]  traMADol (ULTRAM) 50 MG tablet Take 50-100 mg by mouth every 6 (six) hours as needed for moderate pain (depends on pain if takes 1-2 tablets).    Yes [provider]  traZODone (DESYREL) 100 MG tablet Take 100 mg by mouth at bedtime.   Yes [provider]  dicyclomine (BENTYL) 20 MG tablet Take 1 tablet (20 mg total) by mouth 3 (three) times daily as needed for spasms. 03/31/18   Benjiman CorePickering, Teliah Buffalo, MD    Family History Family History  Problem Relation Age of Onset  . Diabetes Mother   . Hypertension Mother   . Hypertension Father   . Stroke Father   . Cancer Father        prostate  . Cancer Maternal Grandmother   . Kidney disease Paternal Grandmother   . Hypertension Paternal Grandmother   . Hyperlipidemia Paternal Grandmother   . Heart disease Paternal Grandmother   . Heart attack Paternal Grandmother   . Diabetes Daughter   . Hypothyroidism Daughter   . Anxiety disorder Sister   . Anxiety disorder Brother   . Anxiety disorder Sister   . Colon cancer Neg Hx     Social History Social History   Tobacco Use  . Smoking status: Never Smoker  . Smokeless tobacco: Never Used  . Tobacco comment: Never smoker  Substance Use Topics  .  Alcohol use: Yes    Comment: occ.  . Drug use: No     Allergies   Abilify [aripiprazole]; Reglan [metoclopramide]; and Dexamethasone   Review of Systems Review of Systems  Constitutional: Positive for appetite change.  HENT: Negative for congestion.   Respiratory: Negative for shortness of breath.   Gastrointestinal: Positive for abdominal pain and diarrhea.  Genitourinary: Negative for dysuria.  Musculoskeletal: Negative for back pain.  Skin: Negative for rash.  Neurological: Negative for seizures.  Hematological: Negative for adenopathy.  Psychiatric/Behavioral: Negative for confusion.     Physical Exam Updated Vital Signs BP 109/79 (BP Location: Right Arm)   Pulse 71  Temp 98.1 F (36.7 C) (Oral)   Resp 18   Ht 5\' 3"  (1.6 m)   Wt 85.9 kg (189 lb 6 oz)   SpO2 99%   BMI 33.55 kg/m   Physical Exam  Constitutional: She appears well-developed.  HENT:  Head: Normocephalic.  Eyes: Pupils are equal, round, and reactive to light.  Cardiovascular: Normal rate and regular rhythm.  Pulmonary/Chest: Breath sounds normal.  Abdominal: Normal appearance. No hernia.  Epigastric to left upper quadrant tenderness without rebound or guarding.  Neurological: She is alert.  Skin: Skin is warm. Capillary refill takes less than 2 seconds.     ED Treatments / Results  Labs (all labs ordered are listed, but only abnormal results are displayed) Labs Reviewed  COMPREHENSIVE METABOLIC PANEL - Abnormal; Notable for the following components:      Result Value   Potassium 3.1 (*)    Calcium 8.3 (*)    All other components within normal limits  URINALYSIS, ROUTINE W REFLEX MICROSCOPIC - Abnormal; Notable for the following components:   Hgb urine dipstick SMALL (*)    All other components within normal limits  URINALYSIS, MICROSCOPIC (REFLEX) - Abnormal; Notable for the following components:   Bacteria, UA RARE (*)    All other components within normal limits  LIPASE, BLOOD  CBC   PREGNANCY, URINE    EKG None  Radiology No results found.  Procedures Procedures (including critical care time)  Medications Ordered in ED Medications  sodium chloride 0.9 % bolus 1,000 mL (0 mLs Intravenous Stopped 03/31/18 2021)  dicyclomine (BENTYL) injection 10 mg (10 mg Intramuscular Given 03/31/18 2001)     Initial Impression / Assessment and Plan / ED Course  I have reviewed the triage vital signs and the nursing notes.  Pertinent labs & imaging results that were available during my care of the patient were reviewed by me and considered in my medical decision making (see chart for details).     Patient with diarrhea and abdominal pain.  Lab work overall reassuring.  Feels somewhat better after fluid bolus.  Mild hypokalemia but otherwise labs reassuring.  Will discharge with Bentyl to help symptomatically.  Final Clinical Impressions(s) / ED Diagnoses   Final diagnoses:  Generalized abdominal pain  Diarrhea, unspecified type    ED Discharge Orders        Ordered    dicyclomine (BENTYL) 20 MG tablet  3 times daily PRN     03/31/18 2020       Benjiman Core, MD 03/31/18 2338

## 2018-03-31 NOTE — ED Notes (Signed)
ED Provider at bedside. 

## 2018-04-02 ENCOUNTER — Encounter (INDEPENDENT_AMBULATORY_CARE_PROVIDER_SITE_OTHER): Payer: Self-pay | Admitting: Internal Medicine

## 2018-04-06 ENCOUNTER — Telehealth (INDEPENDENT_AMBULATORY_CARE_PROVIDER_SITE_OTHER): Payer: Self-pay | Admitting: Internal Medicine

## 2018-04-06 DIAGNOSIS — R1084 Generalized abdominal pain: Secondary | ICD-10-CM

## 2018-04-06 DIAGNOSIS — K5900 Constipation, unspecified: Secondary | ICD-10-CM

## 2018-04-06 MED ORDER — LINACLOTIDE 72 MCG PO CAPS: 72.0000 ug | ORAL_CAPSULE | Freq: Every day | ORAL | 3 refills | Status: DC

## 2018-04-06 MED ORDER — DICYCLOMINE HCL 20 MG PO TABS: 20.0000 mg | ORAL_TABLET | Freq: Three times a day (TID) | ORAL | 3 refills | Status: DC | PRN

## 2018-04-06 NOTE — Telephone Encounter (Signed)
Rx sent to her pharmacy 

## 2018-06-29 ENCOUNTER — Emergency Department (HOSPITAL_COMMUNITY): Payer: 59

## 2018-06-29 ENCOUNTER — Emergency Department (HOSPITAL_COMMUNITY)
Admission: EM | Admit: 2018-06-29 | Discharge: 2018-06-29 | Disposition: A | Discharge status: Home / Self Care | Payer: 59 | Attending: Emergency Medicine | Admitting: Emergency Medicine

## 2018-06-29 ENCOUNTER — Other Ambulatory Visit: Payer: Self-pay

## 2018-06-29 ENCOUNTER — Encounter (HOSPITAL_COMMUNITY): Payer: Self-pay

## 2018-06-29 DIAGNOSIS — Z79899 Other long term (current) drug therapy: Secondary | ICD-10-CM | POA: Insufficient documentation

## 2018-06-29 DIAGNOSIS — Y999 Unspecified external cause status: Secondary | ICD-10-CM | POA: Diagnosis not present

## 2018-06-29 DIAGNOSIS — W109XXA Fall (on) (from) unspecified stairs and steps, initial encounter: Secondary | ICD-10-CM | POA: Insufficient documentation

## 2018-06-29 DIAGNOSIS — W19XXXA Unspecified fall, initial encounter: Secondary | ICD-10-CM

## 2018-06-29 DIAGNOSIS — S46911A Strain of unspecified muscle, fascia and tendon at shoulder and upper arm level, right arm, initial encounter: Secondary | ICD-10-CM | POA: Insufficient documentation

## 2018-06-29 DIAGNOSIS — S4991XA Unspecified injury of right shoulder and upper arm, initial encounter: Secondary | ICD-10-CM | POA: Diagnosis present

## 2018-06-29 DIAGNOSIS — E039 Hypothyroidism, unspecified: Secondary | ICD-10-CM | POA: Insufficient documentation

## 2018-06-29 DIAGNOSIS — S66911A Strain of unspecified muscle, fascia and tendon at wrist and hand level, right hand, initial encounter: Secondary | ICD-10-CM | POA: Diagnosis not present

## 2018-06-29 DIAGNOSIS — Y929 Unspecified place or not applicable: Secondary | ICD-10-CM | POA: Insufficient documentation

## 2018-06-29 DIAGNOSIS — Y9301 Activity, walking, marching and hiking: Secondary | ICD-10-CM | POA: Diagnosis not present

## 2018-06-29 DIAGNOSIS — T148XXA Other injury of unspecified body region, initial encounter: Secondary | ICD-10-CM

## 2018-06-29 MED ORDER — IBUPROFEN 600 MG PO TABS: 600.0000 mg | ORAL_TABLET | Freq: Four times a day (QID) | ORAL | 0 refills | Status: DC | PRN

## 2018-06-29 MED ORDER — ONDANSETRON 4 MG PO TBDP
4.0000 mg | ORAL_TABLET | Freq: Once | ORAL | Status: AC
  Administered 2018-06-29: 4 mg via ORAL
  Filled 2018-06-29: qty 1

## 2018-06-29 MED ORDER — IBUPROFEN 800 MG PO TABS
800.0000 mg | ORAL_TABLET | Freq: Once | ORAL | Status: AC
  Administered 2018-06-29: 800 mg via ORAL
  Filled 2018-06-29: qty 1

## 2018-06-29 MED ORDER — METHOCARBAMOL 750 MG PO TABS: 750.0000 mg | ORAL_TABLET | Freq: Four times a day (QID) | ORAL | 0 refills | Status: DC | PRN

## 2018-06-29 NOTE — Discharge Instructions (Addendum)
As discussed, your x-rays are negative for acute bony injury.  Use the medications as prescribed.  Take the ibuprofen with a meal or snack to avoid nausea.  Use ice as much as possible for the next 3 days, you can also add a heating pad starting on Thursday 20 minutes 3 times daily to the areas that are still painful.

## 2018-06-29 NOTE — ED Triage Notes (Signed)
Pt fell today up her stairs. Is complaining of pain in her right wrist as well as right thigh. Pain radiates up into her shoulder

## 2018-06-29 NOTE — ED Notes (Signed)
Returned from  X-ray

## 2018-06-30 NOTE — ED Provider Notes (Signed)
[facility] EMERGENCY DEPARTMENT Provider Note   CSN: [id number] Arrival date & time: 06/29/18  1218     History   Chief Complaint Chief Complaint  Patient presents with  . Fall    HPI Marisa Johnson is a 42 y.o. female.  The history is provided by the patient.  Fall  This is a new problem. The current episode started 1 to 2 hours ago. Episode frequency: Pt tripped going up her brick steps at her home, landing on several bags of soft groceries, but landed with her outstretched right hand. The problem has not changed since onset.Pertinent negatives include no chest pain, no headaches and no shortness of breath. Associated symptoms comments: Denies head injury, no neck or back pain, no loc.  . The symptoms are aggravated by bending. The symptoms are relieved by rest. She has tried a cold compress for the symptoms. The treatment provided mild relief.    Past Medical History:  Diagnosis Date  . Anxiety   . Carpal tunnel syndrome   . Connective tissue disorder (HCC)   . Contraceptive management 08/09/2015  . Decreased libido 08/09/2015  . Friable cervix 08/09/2015  . History of abnormal cervical Pap smear 08/09/2015  . Hypothyroidism   . IBS (irritable bowel syndrome)   . Irregular intermenstrual bleeding 08/09/2015  . Major depressive disorder   . Migraine   . Mixed connective tissue disease (HCC)   . Pseudobulbar affect   . PTSD (post-traumatic stress disorder)   . Sjogren's syndrome with lung involvement (HCC)   . Thyroid disease   . Vaginal Pap smear, abnormal     Patient Active Problem List   Diagnosis Date Noted  . Cervicitis and endocervicitis 02/11/2018  . History of abortion 07/25/2017  . Endometritis 07/25/2017  . Pelvic pain 07/25/2017  . Dyspnea 05/03/2017  . SOB (shortness of breath) 05/03/2017  . 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  . Sjogren's syndrome with lung involvement (HCC) 03/15/2017  . Irregular intermenstrual bleeding 08/09/2015  .  Decreased libido 08/09/2015  . Friable cervix 08/09/2015  . Contraceptive management 08/09/2015  . History of abnormal cervical Pap smear 08/09/2015  . Abdominal pain, left lower quadrant 09/21/2013  . Nausea and vomiting 09/20/2013  . Irritable bowel syndrome 05/29/2010  . CLOSED FRACTURE OF HEAD OF RADIUS 04/13/2008  . FX CLOSED FIBULA NOS 04/13/2008    Past Surgical History:  Procedure Laterality Date  . DILATION AND CURETTAGE OF UTERUS N/A 07/25/2017   Procedure: SUCTION DILATATION AND CURETTAGE;  Surgeon: Tilda Burrow, MD;  Location: [facility];  Service: Gynecology;  Laterality: N/A;  . DILITATION & CURRETTAGE/HYSTROSCOPY WITH NOVASURE ABLATION N/A 09/30/2017   Procedure: DILATATION, HYSTEROSCOPY WITH NOVASURE ENDOMETRIAL ABLATION;  Surgeon: Tilda Burrow, MD;  Location: [facility];  Service: Gynecology;  Laterality: N/A;  . ESOPHAGOGASTRODUODENOSCOPY N/A 11/17/2016   Procedure: ESOPHAGOGASTRODUODENOSCOPY (EGD);  Surgeon: Malissa Hippo, MD;  Location: [facility];  Service: Endoscopy;  Laterality: N/A;  . LAPAROSCOPIC BILATERAL SALPINGECTOMY Bilateral 09/30/2017   Procedure: LAPAROSCOPIC BILATERAL SALPINGECTOMY;  Surgeon: Tilda Burrow, MD;  Location: [facility];  Service: Gynecology;  Laterality: Bilateral;  . None       OB History    Gravida  5   Para  3   Term  3   Preterm      AB  2   Living  3     SAB  1   TAB  1   Ectopic      Multiple  Live Births               Home Medications    Prior to Admission medications   Medication Sig Start Date End Date Taking? Authorizing Provider  Brexpiprazole (REXULTI) 0.5 MG TABS Take by mouth.    [provider]  buPROPion (WELLBUTRIN XL) 300 MG 24 hr tablet Take 300 mg by mouth every evening.     [provider]  clonazePAM (KLONOPIN) 0.5 MG tablet Take 0.5 mg by mouth 2 (two) times daily.     [provider]  dicyclomine (BENTYL) 20 MG tablet Take 1 tablet (20 mg total) by  mouth 3 (three) times daily as needed for spasms. 04/06/18   Setzer, Brand Males, NP  ibuprofen (ADVIL,MOTRIN) 600 MG tablet Take 1 tablet (600 mg total) by mouth every 6 (six) hours as needed. 06/29/18   Burgess Amor, PA-C  lamoTRIgine (LAMICTAL) 200 MG tablet Take 300 mg by mouth at bedtime. Takes 1.5 tablet    [provider]  levothyroxine (SYNTHROID, LEVOTHROID) 137 MCG tablet Take 150 mcg by mouth daily before breakfast.     [provider]  linaclotide (LINZESS) 72 MCG capsule Take 1 capsule (72 mcg total) by mouth daily before breakfast. 04/06/18   Setzer, Brand Males, NP  methocarbamol (ROBAXIN-750) 750 MG tablet Take 1 tablet (750 mg total) by mouth every 6 (six) hours as needed for muscle spasms. 06/29/18   Burgess Amor, PA-C  metroNIDAZOLE (METROGEL) 0.75 % vaginal gel Place 1 Applicatorful vaginally at bedtime. Apply one applicatorful to vagina at bedtime twice weekly for cervical inflammation 02/22/18   Tilda Burrow, MD  ondansetron (ZOFRAN) 4 MG tablet Take 1 tablet (4 mg total) by mouth every 8 (eight) hours as needed for nausea or vomiting. 07/17/17   Elvina Sidle, MD  rizatriptan (MAXALT) 10 MG tablet Take 10 mg by mouth as needed for migraine. May repeat in 2 hours if needed    [provider]  traZODone (DESYREL) 100 MG tablet Take 100 mg by mouth at bedtime.    [provider]    Family History Family History  Problem Relation Age of Onset  . Diabetes Mother   . Hypertension Mother   . Hypertension Father   . Stroke Father   . Cancer Father        prostate  . Cancer Maternal Grandmother   . Kidney disease Paternal Grandmother   . Hypertension Paternal Grandmother   . Hyperlipidemia Paternal Grandmother   . Heart disease Paternal Grandmother   . Heart attack Paternal Grandmother   . Diabetes Daughter   . Hypothyroidism Daughter   . Anxiety disorder Sister   . Anxiety disorder Brother   . Anxiety disorder Sister   . Colon cancer Neg Hx       Social History Social History   Tobacco Use  . Smoking status: Never Smoker  . Smokeless tobacco: Never Used  . Tobacco comment: Never smoker  Substance Use Topics  . Alcohol use: Yes    Comment: occ.  . Drug use: No     Allergies   Abilify [aripiprazole]; Reglan [metoclopramide]; and Dexamethasone   Review of Systems Review of Systems  Constitutional: Negative for fever.  Respiratory: Negative for shortness of breath.   Cardiovascular: Negative for chest pain.  Musculoskeletal: Positive for arthralgias and joint swelling. Negative for back pain, myalgias and neck pain.  Skin: Positive for wound.  Neurological: Negative for weakness, numbness and headaches.  Physical Exam Updated Vital Signs BP 132/78 (BP Location: Left Arm)   Pulse 69   Temp 98 F (36.7 C) (Oral)   Resp 16   Ht 5\' 3"  (1.6 m)   Wt 85.3 kg   SpO2 99%   BMI 33.30 kg/m   Physical Exam  Constitutional: She appears well-developed and well-nourished.  HENT:  Head: Atraumatic.  Neck: Normal range of motion.  Cardiovascular: Normal rate.  Pulses equal bilaterally  Pulmonary/Chest: Effort normal.  Musculoskeletal: She exhibits tenderness.       Right wrist: She exhibits swelling.       Cervical back: Normal.       Thoracic back: Normal.  Mild swelling over the right dorsal wrist. No deformity. Finger FROM.  Distal sensation intact with less than 2 sec cap refill in fingertips.  Forearm nontender, tender without deformity right medial condyle elbow, tender without deformity right shoulder at posterior humeral head. Pt FROM lower extremities. Weight bearing without pain.  Superficial abrasion mid right lateral thigh.  Neurological: She is alert. She has normal strength. She displays normal reflexes. No sensory deficit.  Skin: Skin is warm and dry.  Psychiatric: She has a normal mood and affect.     ED Treatments / Results  Labs (all labs ordered are listed, but only abnormal results are  displayed) Labs Reviewed - No data to display  EKG None  Radiology Dg Shoulder Right  Result Date: 06/29/2018 CLINICAL DATA:  Right shoulder pain after fall. EXAM: RIGHT SHOULDER - 2+ VIEW COMPARISON:  None. FINDINGS: There is no evidence of fracture or dislocation. There is no evidence of arthropathy or other focal bone abnormality. Soft tissues are unremarkable. IMPRESSION: Normal right shoulder. Electronically Signed   By: Lupita Raider, M.D.   On: 06/29/2018 14:15   Dg Elbow Complete Right  Result Date: 06/29/2018 CLINICAL DATA:  Right elbow pain after fall. EXAM: RIGHT ELBOW - COMPLETE 3+ VIEW COMPARISON:  None. FINDINGS: There is no evidence of fracture, dislocation, or joint effusion. There is no evidence of arthropathy or other focal bone abnormality. Soft tissues are unremarkable. IMPRESSION: Normal right elbow. Electronically Signed   By: Lupita Raider, M.D.   On: 06/29/2018 14:17   Dg Wrist Complete Right  Result Date: 06/29/2018 CLINICAL DATA:  Right wrist pain after fall. EXAM: RIGHT WRIST - COMPLETE 3+ VIEW COMPARISON:  Radiographs of June 05, 2014. FINDINGS: There is no evidence of fracture or dislocation. There is no evidence of arthropathy or other focal bone abnormality. Soft tissues are unremarkable. IMPRESSION: Normal right wrist. Electronically Signed   By: Lupita Raider, M.D.   On: 06/29/2018 14:16    Procedures Procedures (including critical care time)  Medications Ordered in ED Medications  ibuprofen (ADVIL,MOTRIN) tablet 800 mg (800 mg Oral Given 06/29/18 1359)  ondansetron (ZOFRAN-ODT) disintegrating tablet 4 mg (4 mg Oral Given 06/29/18 1502)     Initial Impression / Assessment and Plan / ED Course  I have reviewed the triage vital signs and the nursing notes.  Pertinent labs & imaging results that were available during my care of the patient were reviewed by me and considered in my medical decision making (see chart for details).     Imaging  reviewed, discussed role of ice and heat tx. activities as tolerated Ibuprofen, robaxin. Prn f/u with pcp prn.   Final Clinical Impressions(s) / ED Diagnoses   Final diagnoses:  Fall, initial encounter  Musculoskeletal strain    ED Discharge Orders  Ordered    ibuprofen (ADVIL,MOTRIN) 600 MG tablet  Every 6 hours PRN     06/29/18 1445    methocarbamol (ROBAXIN-750) 750 MG tablet  Every 6 hours PRN     06/29/18 1445           Burgess Amordol, Peri Kreft, PA-C 06/30/18 1435    Bethann BerkshireZammit, Joseph, MD 06/30/18 1500

## 2018-07-23 ENCOUNTER — Other Ambulatory Visit (HOSPITAL_COMMUNITY): Payer: Self-pay | Admitting: Pulmonary Disease

## 2018-07-23 ENCOUNTER — Ambulatory Visit (HOSPITAL_COMMUNITY)
Admission: RE | Admit: 2018-07-23 | Discharge: 2018-07-23 | Disposition: A | Discharge status: Home / Self Care | Payer: 59 | Source: Ambulatory Visit | Attending: Pulmonary Disease | Admitting: Pulmonary Disease

## 2018-07-23 DIAGNOSIS — R0602 Shortness of breath: Secondary | ICD-10-CM

## 2018-07-23 DIAGNOSIS — J690 Pneumonitis due to inhalation of food and vomit: Secondary | ICD-10-CM

## 2018-07-23 DIAGNOSIS — R079 Chest pain, unspecified: Secondary | ICD-10-CM | POA: Insufficient documentation

## 2018-07-23 MED ORDER — IOPAMIDOL (ISOVUE-300) INJECTION 61%
75.0000 mL | Freq: Once | INTRAVENOUS | Status: AC | PRN
  Administered 2018-07-23: 75 mL via INTRAVENOUS

## 2019-05-15 IMAGING — CR DG CHEST 1V PORT
1 series · 1 of 1 positions shown · non-contrast
Comparison: March 15, 2017 chest radiograph and chest CT

CLINICAL DATA: Shortness of Breath

EXAM:
PORTABLE CHEST 1 VIEW

[portable]
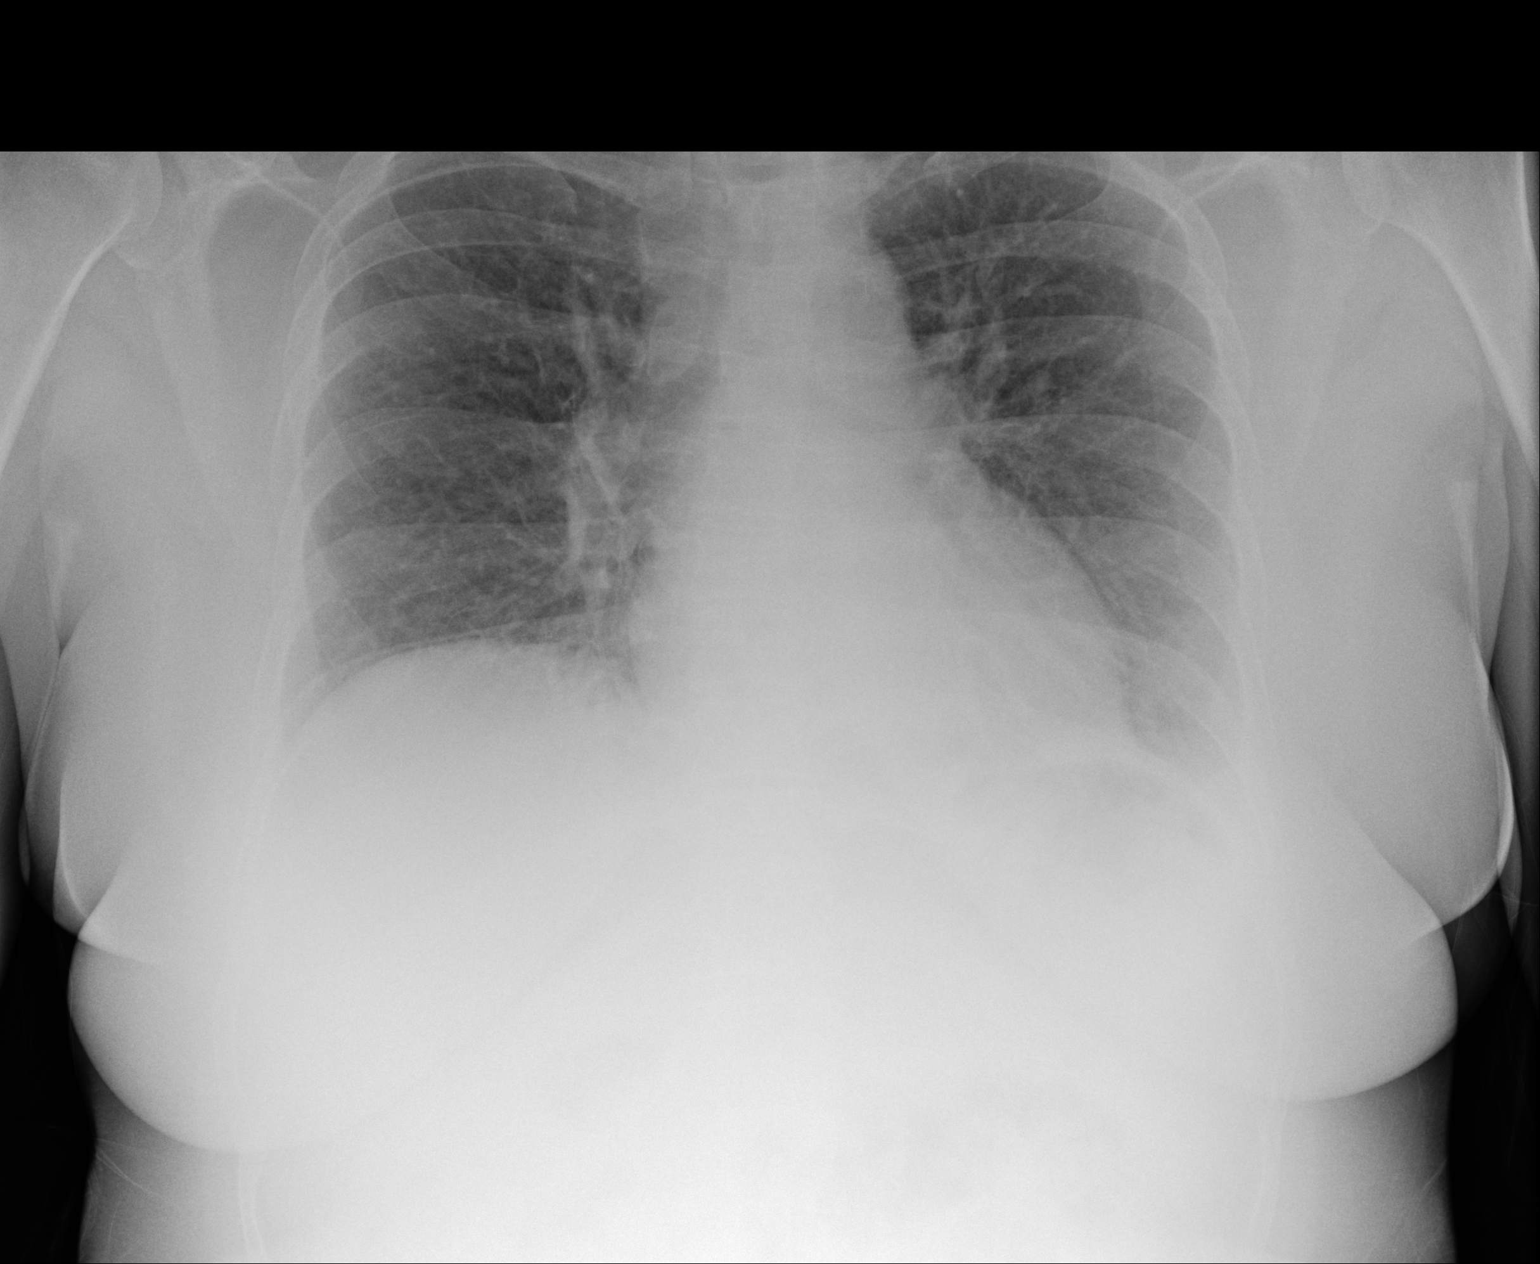

[1 of 1 positions shown; findings below may reference images not displayed]

FINDINGS: There is mild generalized interstitial edema. No airspace
consolidation. Heart is upper normal in size with pulmonary venous
hypertension. No adenopathy. No bone lesions.
IMPRESSION: There is mild generalized interstitial edema with changes of
pulmonary vascular congestion. There may be a degree of congestive
heart failure. No airspace consolidation.

## 2019-09-02 ENCOUNTER — Other Ambulatory Visit (HOSPITAL_COMMUNITY): Payer: Self-pay | Admitting: Internal Medicine

## 2019-09-02 DIAGNOSIS — Z1231 Encounter for screening mammogram for malignant neoplasm of breast: Secondary | ICD-10-CM

## 2019-09-22 ENCOUNTER — Other Ambulatory Visit (HOSPITAL_COMMUNITY): Payer: Self-pay | Admitting: Adult Health Nurse Practitioner

## 2019-09-22 DIAGNOSIS — Z1231 Encounter for screening mammogram for malignant neoplasm of breast: Secondary | ICD-10-CM

## 2019-10-06 ENCOUNTER — Other Ambulatory Visit: Payer: Self-pay

## 2019-10-06 ENCOUNTER — Other Ambulatory Visit (HOSPITAL_COMMUNITY)
Admission: RE | Admit: 2019-10-06 | Discharge: 2019-10-06 | Disposition: A | Discharge status: Home / Self Care | Payer: 59 | Source: Ambulatory Visit | Attending: Obstetrics and Gynecology | Admitting: Obstetrics and Gynecology

## 2019-10-06 ENCOUNTER — Ambulatory Visit (INDEPENDENT_AMBULATORY_CARE_PROVIDER_SITE_OTHER): Payer: 59 | Admitting: Obstetrics and Gynecology

## 2019-10-06 ENCOUNTER — Encounter: Payer: Self-pay | Admitting: Obstetrics and Gynecology

## 2019-10-06 VITALS — BP 138/90 | HR 75 | Ht 63.0 in | Wt 202.8 lb

## 2019-10-06 DIAGNOSIS — N939 Abnormal uterine and vaginal bleeding, unspecified: Secondary | ICD-10-CM

## 2019-10-06 DIAGNOSIS — Z113 Encounter for screening for infections with a predominantly sexual mode of transmission: Secondary | ICD-10-CM | POA: Insufficient documentation

## 2019-10-06 LAB — POCT HEMOGLOBIN: Hemoglobin: 11.6 g/dL (ref 11–14.6)

## 2019-10-06 MED ORDER — MEDROXYPROGESTERONE ACETATE 10 MG PO TABS: 10.0000 mg | ORAL_TABLET | Freq: Every day | ORAL | 0 refills | Status: DC

## 2019-10-06 MED ORDER — MEGESTROL ACETATE 40 MG PO TABS: 40.0000 mg | ORAL_TABLET | Freq: Every day | ORAL | 2 refills | Status: DC

## 2019-10-06 NOTE — Progress Notes (Signed)
Patient ID: Dorathy Kinsman, female   DOB: 08-25-1976, 43 y.o.   MRN: [id number]    [facility] Visit  @DATE @            Patient name: Marisa Johnson MRN [id number]  Date of birth: Nov 10, 1975  CC & HPI:  Marisa Johnson is a 43 y.o. female presenting today for DUB s/p ablation 2 years ago. She has a retroverted uterus. Since her visit in April she has had cramps mainly on her left side. If she has bowel movement or sexual intercourse she has spotting for a few days. Last week she had severe cramping with a mix of dark red blood and bright red blood. She had a single episode of vomiting. Her and her husband have been split up for the past 2 weeks  ROS:  ROS +DUB +vomiting -fever -chills Patient and husband are undergoing marital challenges, temporarily separated x2 weeks.  No other relationships by the partner to patient's knowledge.  Sexual activity is been very infrequent last year Pertinent History Reviewed:   Reviewed: Significant for Medical         Past Medical History:  Diagnosis Date  . Anxiety   . Carpal tunnel syndrome   . Connective tissue disorder ([place])   . Contraceptive management 08/09/2015  . Decreased libido 08/09/2015  . Friable cervix 08/09/2015  . History of abnormal cervical Pap smear 08/09/2015  . Hypothyroidism   . IBS (irritable bowel syndrome)   . Irregular intermenstrual bleeding 08/09/2015  . Major depressive disorder   . Migraine   . Mixed connective tissue disease ([place])   . Pseudobulbar affect   . PTSD (post-traumatic stress disorder)   . Sjogren's syndrome with lung involvement ([place])   . Thyroid disease   . Vaginal Pap smear, abnormal                               Surgical Hx:    Past Surgical History:  Procedure Laterality Date  . DILATION AND CURETTAGE OF UTERUS N/A 07/25/2017   Procedure: SUCTION DILATATION AND CURETTAGE;  Surgeon: Jonnie Kind, MD;  Location: [facility];  Service: Gynecology;  Laterality: N/A;  . DILITATION &  CURRETTAGE/HYSTROSCOPY WITH NOVASURE ABLATION N/A 09/30/2017   Procedure: DILATATION, HYSTEROSCOPY WITH NOVASURE ENDOMETRIAL ABLATION;  Surgeon: Jonnie Kind, MD;  Location: [facility];  Service: Gynecology;  Laterality: N/A;  . ESOPHAGOGASTRODUODENOSCOPY N/A 11/17/2016   Procedure: ESOPHAGOGASTRODUODENOSCOPY (EGD);  Surgeon: Rogene Houston, MD;  Location: [facility];  Service: Endoscopy;  Laterality: N/A;  . LAPAROSCOPIC BILATERAL SALPINGECTOMY Bilateral 09/30/2017   Procedure: LAPAROSCOPIC BILATERAL SALPINGECTOMY;  Surgeon: Jonnie Kind, MD;  Location: [facility];  Service: Gynecology;  Laterality: Bilateral;  . None     Medications: Reviewed & Updated - see associated section                       Current Outpatient Medications:  .  Brexpiprazole (REXULTI) 0.5 MG TABS, Take by mouth., Disp: , Rfl:  .  buPROPion (WELLBUTRIN XL) 300 MG 24 hr tablet, Take 300 mg by mouth every evening. , Disp: , Rfl:  .  clonazePAM (KLONOPIN) 0.5 MG tablet, Take 0.5 mg by mouth 2 (two) times daily. , Disp: , Rfl:  .  dicyclomine (BENTYL) 20 MG tablet, Take 1 tablet (20 mg total) by mouth 3 (three) times daily as needed for spasms., Disp: 60  tablet, Rfl: 3 .  ibuprofen (ADVIL,MOTRIN) 600 MG tablet, Take 1 tablet (600 mg total) by mouth every 6 (six) hours as needed., Disp: 30 tablet, Rfl: 0 .  lamoTRIgine (LAMICTAL) 200 MG tablet, Take 300 mg by mouth at bedtime. Takes 1.5 tablet, Disp: , Rfl:  .  levothyroxine (SYNTHROID, LEVOTHROID) 137 MCG tablet, Take 150 mcg by mouth daily before breakfast. , Disp: , Rfl:  .  linaclotide (LINZESS) 72 MCG capsule, Take 1 capsule (72 mcg total) by mouth daily before breakfast., Disp: 30 capsule, Rfl: 3 .  methocarbamol (ROBAXIN-750) 750 MG tablet, Take 1 tablet (750 mg total) by mouth every 6 (six) hours as needed for muscle spasms., Disp: 15 tablet, Rfl: 0 .  metroNIDAZOLE (METROGEL) 0.75 % vaginal gel, Place 1 Applicatorful vaginally at bedtime. Apply one applicatorful to  vagina at bedtime twice weekly for cervical inflammation, Disp: 70 g, Rfl: 1 .  ondansetron (ZOFRAN) 4 MG tablet, Take 1 tablet (4 mg total) by mouth every 8 (eight) hours as needed for nausea or vomiting., Disp: 20 tablet, Rfl: 1 .  rizatriptan (MAXALT) 10 MG tablet, Take 10 mg by mouth as needed for migraine. May repeat in 2 hours if needed, Disp: , Rfl:  .  traZODone (DESYREL) 100 MG tablet, Take 100 mg by mouth at bedtime., Disp: , Rfl:    Social History: Reviewed -  reports that she has never smoked. She has never used smokeless tobacco.  Objective Findings:  Vitals: There were no vitals taken for this visit.  PHYSICAL EXAMINATION General appearance - alert, well appearing, and in no distress Mental status - oriented to person, place, and time, normal mood, behavior, speech, dress, motor activity, and thought processes, affect appropriate to mood  PELVIC GCCHL collected   Assessment & Plan:   A:  1.  DUB  P:  1. Provera x 10 day 2. Megace 40 mg daily 3. 3 month f/u    By signing my name below, I, Arnette Norris, attest that this documentation has been prepared under the direction and in the presence of Tilda Burrow, MD. Electronically Signed: Arnette Norris Medical Scribe. 10/06/19. 2:39 PM.  I personally performed the services described in this documentation, which was SCRIBED in my presence. The recorded information has been reviewed and considered accurate. It has been edited as necessary during review. Tilda Burrow, MD

## 2019-10-06 NOTE — Addendum Note (Signed)
Addended by: Jonnie Kind on: 10/06/2019 03:20 PM   Modules accepted: Orders

## 2019-10-12 LAB — CERVICOVAGINAL ANCILLARY ONLY
Chlamydia: NEGATIVE
Comment: NEGATIVE
Comment: NEGATIVE
Comment: NORMAL
Neisseria Gonorrhea: NEGATIVE
Trichomonas: NEGATIVE

## 2019-10-23 ENCOUNTER — Other Ambulatory Visit: Payer: Self-pay

## 2019-10-23 ENCOUNTER — Ambulatory Visit
Admission: EM | Admit: 2019-10-23 | Discharge: 2019-10-23 | Disposition: A | Discharge status: Home / Self Care | Payer: 59 | Attending: Emergency Medicine | Admitting: Emergency Medicine

## 2019-10-23 ENCOUNTER — Ambulatory Visit (INDEPENDENT_AMBULATORY_CARE_PROVIDER_SITE_OTHER): Payer: 59

## 2019-10-23 DIAGNOSIS — R0602 Shortness of breath: Secondary | ICD-10-CM

## 2019-10-23 DIAGNOSIS — T17928A Food in respiratory tract, part unspecified causing other injury, initial encounter: Secondary | ICD-10-CM

## 2019-10-23 DIAGNOSIS — T17920A Food in respiratory tract, part unspecified causing asphyxiation, initial encounter: Secondary | ICD-10-CM | POA: Diagnosis not present

## 2019-10-23 DIAGNOSIS — Z20822 Contact with and (suspected) exposure to covid-19: Secondary | ICD-10-CM

## 2019-10-23 NOTE — ED Provider Notes (Signed)
[facility]   [id number] 10/23/19 Arrival Time: 1427  Cc: Food aspiration  SUBJECTIVE:  Marisa Johnson is a 44 y.o. female hx significant for esophageal dysmotility disorder, who presents with SOB that began today.  States she choked on pasta noodle last night.  She then experienced another choking episode that occurred over night, speculates she may have taken a sip of water prior to episode.  Denies aggravating or alleviating factors.  Has been eating and drinking normally today.  Denies previous symptoms in the past.   Denies fever, chills, fatigue, dyspnea, dysphagia, wheezing, chest pain, nausea, vomiting, changes in bowel or bladder habits.    Had a VV earlier today and was encouraged to be evaluated in person.    Patient also requests COVID test.    ROS: As per HPI.  All other pertinent ROS negative.     Past Medical History:  Diagnosis Date  . ADHD   . Anxiety   . Carpal tunnel syndrome   . Connective tissue disorder ([facility])   . Contraceptive management 08/09/2015  . Decreased libido 08/09/2015  . Friable cervix 08/09/2015  . History of abnormal cervical Pap smear 08/09/2015  . Hypothyroidism   . IBS (irritable bowel syndrome)   . Irregular intermenstrual bleeding 08/09/2015  . Major depressive disorder   . Migraine   . Mixed connective tissue disease ([facility])   . Pseudobulbar affect   . PTSD (post-traumatic stress disorder)   . Sjogren's syndrome with lung involvement ([facility])   . Thyroid disease   . Vaginal Pap smear, abnormal    Past Surgical History:  Procedure Laterality Date  . DILATION AND CURETTAGE OF UTERUS N/A 07/25/2017   Procedure: SUCTION DILATATION AND CURETTAGE;  Surgeon: Tilda Burrow, MD;  Location: [facility];  Service: Gynecology;  Laterality: N/A;  . DILITATION & CURRETTAGE/HYSTROSCOPY WITH NOVASURE ABLATION N/A 09/30/2017   Procedure: DILATATION, HYSTEROSCOPY WITH NOVASURE ENDOMETRIAL ABLATION;  Surgeon: Tilda Burrow, MD;  Location: [facility];   Service: Gynecology;  Laterality: N/A;  . ESOPHAGOGASTRODUODENOSCOPY N/A 11/17/2016   Procedure: ESOPHAGOGASTRODUODENOSCOPY (EGD);  Surgeon: Malissa Hippo, MD;  [facility];  Service: Endoscopy;  Laterality: N/A;  . LAPAROSCOPIC BILATERAL SALPINGECTOMY Bilateral 09/30/2017   Procedure: LAPAROSCOPIC BILATERAL SALPINGECTOMY;  Surgeon: Tilda Burrow, MD;  Location: [facility];  Service: Gynecology;  Laterality: Bilateral;  . None     Allergies  Allergen Reactions  . Abilify [Aripiprazole] Other (See Comments)    Reaction:  Restless leg syndrome   . Reglan [Metoclopramide] Other (See Comments)    Reaction:  Hallucinations   . Dexamethasone Rash   No current facility-administered medications on file prior to encounter.   Current Outpatient Medications on File Prior to Encounter  Medication Sig Dispense Refill  . ADDERALL XR 30 MG 24 hr capsule Take 30 mg by mouth daily.    Marland Kitchen AIMOVIG 70 MG/ML SOAJ Inject 1 Syringe into the skin every 30 (thirty) days.    . Brexpiprazole (REXULTI) 0.5 MG TABS Take by mouth.    Marland Kitchen buPROPion (WELLBUTRIN XL) 300 MG 24 hr tablet Take 300 mg by mouth every evening.     . clonazePAM (KLONOPIN) 0.5 MG tablet Take 0.5 mg by mouth 2 (two) times daily.     Marland Kitchen lamoTRIgine (LAMICTAL) 200 MG tablet Take 300 mg by mouth at bedtime. Takes 1.5 tablet    . levocetirizine (XYZAL) 5 MG tablet Take 5 mg by mouth at bedtime.    Marland Kitchen levothyroxine (SYNTHROID, LEVOTHROID)  137 MCG tablet Take 150 mcg by mouth daily before breakfast.     . medroxyPROGESTERone (PROVERA) 10 MG tablet Take 1 tablet (10 mg total) by mouth daily. One tablet daily x 10 d to bring on menses 10 tablet 0  . megestrol (MEGACE) 40 MG tablet Take 1 tablet (40 mg total) by mouth daily. Beginning one week after completing provera 45 tablet 2  . traZODone (DESYREL) 100 MG tablet Take 100 mg by mouth at bedtime.      Social History   Socioeconomic History  . Marital status: Married    Spouse name: Not on  file  . Number of children: Not on file  . Years of education: Not on file  . Highest education level: Not on file  Occupational History  . Occupation: Teacher, adult education: [employer]    Comment: ED  Tobacco Use  . Smoking status: Never Smoker  . Smokeless tobacco: Never Used  . Tobacco comment: Never smoker  Substance and Sexual Activity  . Alcohol use: Yes    Comment: occ.  . Drug use: No  . Sexual activity: Yes    Birth control/protection: Surgical    Comment: tubal and ablation  Other Topics Concern  . Not on file  Social History Narrative  . Not on file   Social Determinants of Health   Financial Resource Strain:   . Difficulty of Paying Living Expenses: Not on file  Food Insecurity:   . Worried About Programme researcher, broadcasting/film/video in the Last Year: Not on file  . Ran Out of Food in the Last Year: Not on file  Transportation Needs:   . Lack of Transportation (Medical): Not on file  . Lack of Transportation (Non-Medical): Not on file  Physical Activity:   . Days of Exercise per Week: Not on file  . Minutes of Exercise per Session: Not on file  Stress:   . Feeling of Stress : Not on file  Social Connections:   . Frequency of Communication with Friends and Family: Not on file  . Frequency of Social Gatherings with Friends and Family: Not on file  . Attends Religious Services: Not on file  . Active Member of Clubs or Organizations: Not on file  . Attends Banker Meetings: Not on file  . Marital Status: Not on file  Intimate Partner Violence:   . Fear of Current or Ex-Partner: Not on file  . Emotionally Abused: Not on file  . Physically Abused: Not on file  . Sexually Abused: Not on file   Family History  Problem Relation Age of Onset  . Diabetes Mother   . Hypertension Mother   . Hypothyroidism Mother   . Hypertension Father   . Stroke Father   . Cancer Father        prostate  . Cancer Maternal Grandmother   . Kidney disease Paternal Grandmother   .  Hypertension Paternal Grandmother   . Hyperlipidemia Paternal Grandmother   . Heart disease Paternal Grandmother   . Heart attack Paternal Grandmother   . Diabetes Daughter   . Hypothyroidism Daughter   . Anxiety disorder Sister   . Anxiety disorder Brother   . Anxiety disorder Sister   . Colon cancer Neg Hx      OBJECTIVE:  Vitals:   10/23/19 1510  BP: 118/84  Pulse: 91  Resp: 16  Temp: 98.5 F (36.9 C)  TempSrc: Oral  SpO2: 97%     General appearance: Alert,  well-appearing, nontoxic; speaking in full sentences without difficulty, tolerating own secretions HEENT:NCAT; Ears: EACs clear; Eyes: PERRL.  EOM grossly intact. Nose: nares patent without rhinorrhea; Throat: tonsils nonerythematous or enlarged, uvula midline  Neck: supple without LAD Lungs: clear to auscultation bilaterally without adventitious breath sounds; normal respiratory effort; cough absent Heart: regular rate and rhythm.   Skin: warm and dry Psychological: alert and cooperative; normal mood and affect  DIAGNOSTIC STUDIES:  DG Chest 2 View  Result Date: 10/23/2019 CLINICAL DATA:  Patient choked on food last night. Shortness of breath EXAM: CHEST - 2 VIEW COMPARISON:  May 03, 2017 FINDINGS: The lungs are clear. The heart size and pulmonary vascularity are normal. No adenopathy. No pneumothorax or pneumomediastinum. There is slight upper thoracic levoscoliosis. IMPRESSION: Lungs clear. Cardiac silhouette normal. No adenopathy. No pneumothorax or pneumomediastinum. Electronically Signed   By: Lowella Grip III M.D.   On: 10/23/2019 15:19    X-rays negative for cardiopulmonary disease or FB  I have reviewed the x-rays myself and the radiologist interpretation. I am in agreement with the radiologist interpretation.     ASSESSMENT & PLAN:  1. Aspiration of food, initial encounter   2. Suspected COVID-19 virus infection   3. Shortness of breath     Orders Placed This Encounter  Procedures  . Novel  Coronavirus, NAA ([facility])    Standing Status:   Standing    Number of Occurrences:   1  . DG Chest 2 View    Standing Status:   Standing    Number of Occurrences:   1    Order Specific Question:   Reason for Exam (SYMPTOM  OR DIAGNOSIS REQUIRED)    Answer:   aspiration    Order Specific Question:   Is patient pregnant?    Answer:   No    Chest x-ray negative for aspiration pneumonia or foreign body Physical exam and vital signs reassuring Use albuterol inhaler as needed for shortness of breath Follow up with PCP for recheck next week to ensure symptoms are improving Return or go to ER if you have any new or worsening symptoms such as fever, chills, fatigue, worsening shortness of breath, wheezing, chest pain, difficulty swallowing, nausea, changes in bowel or bladder habits, etc...   COVID test ordered.    Reviewed expectations re: course of current medical issues. Questions answered. Outlined signs and symptoms indicating need for more acute intervention. Patient verbalized understanding. After Visit Summary given.          Lestine Box, PA-C 10/23/19 1642

## 2019-10-23 NOTE — Discharge Instructions (Signed)
Chest x-ray negative for aspiration pneumonia or foreign body Physical exam and vital signs reassuring Use albuterol inhaler as needed for shortness of breaht Follow up with PCP for recheck next week to ensure symptoms are improving Return or go to ER if you have any new or worsening symptoms such as fever, chills, fatigue, worsening shortness of breath, wheezing, chest pain, difficulty swallowing, nausea, changes in bowel or bladder habits, etc..Marland Kitchen

## 2019-10-23 NOTE — ED Triage Notes (Signed)
Pt presents to UC stating she choked on food last night. Pt states she woke up in the middle of the night choking. She felt like she choked on food. Pt states she has felt more sob today.

## 2019-10-24 LAB — NOVEL CORONAVIRUS, NAA: SARS-CoV-2, NAA: NOT DETECTED

## 2019-10-27 ENCOUNTER — Ambulatory Visit (HOSPITAL_COMMUNITY): Payer: 59

## 2019-11-03 ENCOUNTER — Other Ambulatory Visit: Payer: Self-pay

## 2019-11-03 ENCOUNTER — Ambulatory Visit (HOSPITAL_COMMUNITY)
Admission: RE | Admit: 2019-11-03 | Discharge: 2019-11-03 | Disposition: A | Discharge status: Home / Self Care | Payer: 59 | Source: Ambulatory Visit | Attending: Adult Health Nurse Practitioner | Admitting: Adult Health Nurse Practitioner

## 2019-11-03 DIAGNOSIS — Z1231 Encounter for screening mammogram for malignant neoplasm of breast: Secondary | ICD-10-CM | POA: Insufficient documentation

## 2019-11-08 ENCOUNTER — Other Ambulatory Visit (HOSPITAL_COMMUNITY): Payer: Self-pay | Admitting: Adult Health Nurse Practitioner

## 2019-11-08 DIAGNOSIS — R928 Other abnormal and inconclusive findings on diagnostic imaging of breast: Secondary | ICD-10-CM

## 2019-11-09 ENCOUNTER — Ambulatory Visit (HOSPITAL_COMMUNITY)
Admission: RE | Admit: 2019-11-09 | Discharge: 2019-11-09 | Disposition: A | Discharge status: Home / Self Care | Payer: 59 | Source: Ambulatory Visit | Attending: Adult Health Nurse Practitioner | Admitting: Adult Health Nurse Practitioner

## 2019-11-09 ENCOUNTER — Other Ambulatory Visit: Payer: Self-pay

## 2019-11-09 ENCOUNTER — Ambulatory Visit (HOSPITAL_COMMUNITY): Admission: RE | Admit: 2019-11-09 | Payer: 59 | Source: Ambulatory Visit

## 2019-11-09 DIAGNOSIS — R928 Other abnormal and inconclusive findings on diagnostic imaging of breast: Secondary | ICD-10-CM | POA: Diagnosis present

## 2019-11-27 ENCOUNTER — Emergency Department (HOSPITAL_COMMUNITY): Payer: 59

## 2019-11-27 ENCOUNTER — Encounter (HOSPITAL_COMMUNITY): Payer: Self-pay

## 2019-11-27 ENCOUNTER — Emergency Department (HOSPITAL_COMMUNITY)
Admission: EM | Admit: 2019-11-27 | Discharge: 2019-11-27 | Disposition: A | Discharge status: Home / Self Care | Payer: 59 | Attending: Emergency Medicine | Admitting: Emergency Medicine

## 2019-11-27 ENCOUNTER — Other Ambulatory Visit: Payer: Self-pay

## 2019-11-27 DIAGNOSIS — Y999 Unspecified external cause status: Secondary | ICD-10-CM | POA: Insufficient documentation

## 2019-11-27 DIAGNOSIS — S52522A Torus fracture of lower end of left radius, initial encounter for closed fracture: Secondary | ICD-10-CM | POA: Diagnosis not present

## 2019-11-27 DIAGNOSIS — W01198A Fall on same level from slipping, tripping and stumbling with subsequent striking against other object, initial encounter: Secondary | ICD-10-CM | POA: Diagnosis not present

## 2019-11-27 DIAGNOSIS — M79604 Pain in right leg: Secondary | ICD-10-CM | POA: Diagnosis not present

## 2019-11-27 DIAGNOSIS — S90811A Abrasion, right foot, initial encounter: Secondary | ICD-10-CM | POA: Insufficient documentation

## 2019-11-27 DIAGNOSIS — F909 Attention-deficit hyperactivity disorder, unspecified type: Secondary | ICD-10-CM | POA: Diagnosis not present

## 2019-11-27 DIAGNOSIS — S6992XA Unspecified injury of left wrist, hand and finger(s), initial encounter: Secondary | ICD-10-CM | POA: Diagnosis present

## 2019-11-27 DIAGNOSIS — Z23 Encounter for immunization: Secondary | ICD-10-CM | POA: Insufficient documentation

## 2019-11-27 DIAGNOSIS — Y9289 Other specified places as the place of occurrence of the external cause: Secondary | ICD-10-CM | POA: Diagnosis not present

## 2019-11-27 DIAGNOSIS — Z79899 Other long term (current) drug therapy: Secondary | ICD-10-CM | POA: Diagnosis not present

## 2019-11-27 DIAGNOSIS — S62102A Fracture of unspecified carpal bone, left wrist, initial encounter for closed fracture: Secondary | ICD-10-CM

## 2019-11-27 DIAGNOSIS — E039 Hypothyroidism, unspecified: Secondary | ICD-10-CM | POA: Diagnosis not present

## 2019-11-27 DIAGNOSIS — Y9389 Activity, other specified: Secondary | ICD-10-CM | POA: Diagnosis not present

## 2019-11-27 MED ORDER — IBUPROFEN 800 MG PO TABS
800.0000 mg | ORAL_TABLET | Freq: Once | ORAL | Status: AC
  Administered 2019-11-27: 06:00:00 800 mg via ORAL
  Filled 2019-11-27: qty 1

## 2019-11-27 MED ORDER — TETANUS-DIPHTH-ACELL PERTUSSIS 5-2.5-18.5 LF-MCG/0.5 IM SUSP
0.5000 mL | Freq: Once | INTRAMUSCULAR | Status: AC
  Administered 2019-11-27: 05:00:00 0.5 mL via INTRAMUSCULAR
  Filled 2019-11-27: qty 0.5

## 2019-11-27 MED ORDER — ACETAMINOPHEN 500 MG PO TABS
1000.0000 mg | ORAL_TABLET | Freq: Once | ORAL | Status: AC
  Administered 2019-11-27: 1000 mg via ORAL
  Filled 2019-11-27: qty 2

## 2019-11-27 NOTE — ED Provider Notes (Signed)
[facility] EMERGENCY DEPARTMENT Provider Note   CSN: [id number] Arrival date & time: 11/27/19  0408     History Chief Complaint  Patient presents with  . Fall    Marisa Johnson is a 44 y.o. female.  Patient presents to the emergency department for evaluation after a fall.  Patient reports that she was at a party earlier tonight.  She admits to drinking multiple alcoholic drinks.  When she was leaving the party she fell.  Patient reports that she had difficulty getting back up after the fall because of left wrist pain and right foot and ankle pain.  Her family helped her into the house and she went to sleep on the couch.  She woke up tonight and was still unable to walk, family convinced her to come to the ER for further evaluation.  Patient does not think she hit her head.  She does not have any headache.  No neck or back pain.        Past Medical History:  Diagnosis Date  . ADHD   . Anxiety   . Carpal tunnel syndrome   . Connective tissue disorder (HCC)   . Contraceptive management 08/09/2015  . Decreased libido 08/09/2015  . Friable cervix 08/09/2015  . History of abnormal cervical Pap smear 08/09/2015  . Hypothyroidism   . IBS (irritable bowel syndrome)   . Irregular intermenstrual bleeding 08/09/2015  . Major depressive disorder   . Migraine   . Mixed connective tissue disease (HCC)   . Pseudobulbar affect   . PTSD (post-traumatic stress disorder)   . Sjogren's syndrome with lung involvement (HCC)   . Thyroid disease   . Vaginal Pap smear, abnormal     Patient Active Problem List   Diagnosis Date Noted  . Routine screening for STI (sexually transmitted infection) 10/06/2019  . Cervicitis and endocervicitis 02/11/2018  . History of abortion 07/25/2017  . Endometritis 07/25/2017  . Pelvic pain 07/25/2017  . Dyspnea 05/03/2017  . SOB (shortness of breath) 05/03/2017  . 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  . Sjogren's syndrome with lung involvement (HCC)  03/15/2017  . Irregular intermenstrual bleeding 08/09/2015  . Decreased libido 08/09/2015  . Friable cervix 08/09/2015  . Contraceptive management 08/09/2015  . History of abnormal cervical Pap smear 08/09/2015  . Abdominal pain, left lower quadrant 09/21/2013  . Nausea and vomiting 09/20/2013  . Irritable bowel syndrome 05/29/2010  . CLOSED FRACTURE OF HEAD OF RADIUS 04/13/2008  . FX CLOSED FIBULA NOS 04/13/2008    Past Surgical History:  Procedure Laterality Date  . DILATION AND CURETTAGE OF UTERUS N/A 07/25/2017   Procedure: SUCTION DILATATION AND CURETTAGE;  Surgeon: Tilda Burrow, MD;  Location: [facility];  Service: Gynecology;  Laterality: N/A;  . DILITATION & CURRETTAGE/HYSTROSCOPY WITH NOVASURE ABLATION N/A 09/30/2017   Procedure: DILATATION, HYSTEROSCOPY WITH NOVASURE ENDOMETRIAL ABLATION;  Surgeon: Tilda Burrow, MD;  Location: [facility];  Service: Gynecology;  Laterality: N/A;  . ESOPHAGOGASTRODUODENOSCOPY N/A 11/17/2016   Procedure: ESOPHAGOGASTRODUODENOSCOPY (EGD);  Surgeon: Malissa Hippo, MD;  Location: [facility];  Service: Endoscopy;  Laterality: N/A;  . LAPAROSCOPIC BILATERAL SALPINGECTOMY Bilateral 09/30/2017   Procedure: LAPAROSCOPIC BILATERAL SALPINGECTOMY;  Surgeon: Tilda Burrow, MD;  Location: [facility];  Service: Gynecology;  Laterality: Bilateral;  . None       OB History    Gravida  5   Para  3   Term  3   Preterm      AB  2  Living  3     SAB  1   TAB  1   Ectopic      Multiple      Live Births              Family History  Problem Relation Age of Onset  . Diabetes Mother   . Hypertension Mother   . Hypothyroidism Mother   . Hypertension Father   . Stroke Father   . Cancer Father        prostate  . Cancer Maternal Grandmother   . Kidney disease Paternal Grandmother   . Hypertension Paternal Grandmother   . Hyperlipidemia Paternal Grandmother   . Heart disease Paternal Grandmother   . Heart attack Paternal Grandmother     . Diabetes Daughter   . Hypothyroidism Daughter   . Anxiety disorder Sister   . Anxiety disorder Brother   . Anxiety disorder Sister   . Colon cancer Neg Hx     Social History   Tobacco Use  . Smoking status: Never Smoker  . Smokeless tobacco: Never Used  . Tobacco comment: Never smoker  Substance Use Topics  . Alcohol use: Yes    Comment: occ.  . Drug use: No    Home Medications Prior to Admission medications   Medication Sig Start Date End Date Taking? Authorizing Provider  ADDERALL XR 30 MG 24 hr capsule Take 30 mg by mouth daily. 09/22/19   [provider]  AIMOVIG 70 MG/ML SOAJ Inject 1 Syringe into the skin every 30 (thirty) days. 08/16/19   [provider]  Brexpiprazole (REXULTI) 0.5 MG TABS Take by mouth.    [provider]  buPROPion (WELLBUTRIN XL) 300 MG 24 hr tablet Take 300 mg by mouth every evening.     [provider]  clonazePAM (KLONOPIN) 0.5 MG tablet Take 0.5 mg by mouth 2 (two) times daily.     [provider]  lamoTRIgine (LAMICTAL) 200 MG tablet Take 300 mg by mouth at bedtime. Takes 1.5 tablet    [provider]  levocetirizine (XYZAL) 5 MG tablet Take 5 mg by mouth at bedtime. 09/13/19   [provider]  levothyroxine (SYNTHROID, LEVOTHROID) 137 MCG tablet Take 150 mcg by mouth daily before breakfast.     [provider]  medroxyPROGESTERone (PROVERA) 10 MG tablet Take 1 tablet (10 mg total) by mouth daily. One tablet daily x 10 d to bring on menses 10/06/19   Jonnie Kind, MD  megestrol (MEGACE) 40 MG tablet Take 1 tablet (40 mg total) by mouth daily. Beginning one week after completing provera 10/06/19   Jonnie Kind, MD  traZODone (DESYREL) 100 MG tablet Take 100 mg by mouth at bedtime.    [provider]    Allergies    Abilify [aripiprazole], Reglan [metoclopramide], and Dexamethasone  Review of Systems   Review of Systems  Musculoskeletal: Positive for  arthralgias. Negative for back pain and neck pain.  Skin: Positive for wound.  Neurological: Negative for syncope.  All other systems reviewed and are negative.   Physical Exam Updated Vital Signs BP 123/85 (BP Location: Right Arm)   Pulse (!) 113   Temp 98.4 F (36.9 C) (Oral)   Resp 18   Ht 5\' 3"  (1.6 m)   Wt 90.7 kg   SpO2 97%   BMI 35.43 kg/m   Physical Exam Vitals and nursing note reviewed.  Constitutional:      General: She is not in acute  distress.    Appearance: Normal appearance. She is well-developed.  HENT:     Head: Normocephalic and atraumatic.     Right Ear: Hearing normal.     Left Ear: Hearing normal.     Nose: Nose normal.  Eyes:     Conjunctiva/sclera: Conjunctivae normal.     Pupils: Pupils are equal, round, and reactive to light.  Cardiovascular:     Rate and Rhythm: Regular rhythm.     Heart sounds: S1 normal and S2 normal. No murmur. No friction rub. No gallop.   Pulmonary:     Effort: Pulmonary effort is normal. No respiratory distress.     Breath sounds: Normal breath sounds.  Chest:     Chest wall: No tenderness.  Abdominal:     General: Bowel sounds are normal.     Palpations: Abdomen is soft.     Tenderness: There is no abdominal tenderness. There is no guarding or rebound. Negative signs include Murphy's sign and McBurney's sign.     Hernia: No hernia is present.  Musculoskeletal:        General: Normal range of motion.     Left wrist: Tenderness present. No swelling, deformity or snuff box tenderness. Normal range of motion.     Cervical back: Normal range of motion and neck supple.     Right ankle: No swelling or deformity. Tenderness present.     Right foot: Deformity and tenderness present.  Skin:    General: Skin is warm and dry.     Findings: Abrasion (top of right foot) present. No rash.  Neurological:     Mental Status: She is alert and oriented to person, place, and time.     GCS: GCS eye subscore is 4. GCS verbal subscore  is 5. GCS motor subscore is 6.     Cranial Nerves: No cranial nerve deficit.     Sensory: No sensory deficit.     Coordination: Coordination normal.  Psychiatric:        Speech: Speech normal.        Behavior: Behavior normal.        Thought Content: Thought content normal.     ED Results / Procedures / Treatments   Labs (all labs ordered are listed, but only abnormal results are displayed) Labs Reviewed - No data to display  EKG None  Radiology DG Wrist Complete Left  Result Date: 11/27/2019 CLINICAL DATA:  Fall. EXAM: LEFT WRIST - COMPLETE 3+ VIEW COMPARISON:  None. FINDINGS: There is an acute buckle fracture involving the dorsal aspect of the distal radius. Fracture fragments are in anatomic alignment. No dislocation. IMPRESSION: 1. Acute buckle fracture is noted involving the dorsal aspect of the distal radius. Electronically Signed   By: Signa Kell M.D.   On: 11/27/2019 05:45   DG Ankle Complete Right  Result Date: 11/27/2019 CLINICAL DATA:  Fall.  Ankle pain. EXAM: RIGHT ANKLE - COMPLETE 3+ VIEW COMPARISON:  None. FINDINGS: Mild lateral soft tissue swelling. No visible fracture or dislocation. IMPRESSION: Mild lateral soft tissue swelling. Electronically Signed   By: Paulina Fusi M.D.   On: 11/27/2019 05:46   DG Foot Complete Right  Result Date: 11/27/2019 CLINICAL DATA:  Fall.  Foot pain. EXAM: RIGHT FOOT COMPLETE - 3+ VIEW COMPARISON:  None. FINDINGS: There is no evidence of fracture or dislocation. There is no evidence of arthropathy or other focal bone abnormality. Soft tissues are unremarkable. IMPRESSION: Negative. Electronically Signed   By: Scherrie Bateman.D.  On: 11/27/2019 05:47   DG Hip Unilat W or Wo Pelvis 2-3 Views Right  Result Date: 11/27/2019 CLINICAL DATA:  Larey Seat.  Pelvic pain. EXAM: DG HIP (WITH OR WITHOUT PELVIS) 2-3V RIGHT COMPARISON:  None. FINDINGS: There is no evidence of hip fracture or dislocation. There is no evidence of arthropathy or other focal  bone abnormality. IMPRESSION: Negative. Electronically Signed   By: Paulina Fusi M.D.   On: 11/27/2019 05:45    Procedures Procedures (including critical care time)  Medications Ordered in ED Medications  Tdap (BOOSTRIX) injection 0.5 mL (0.5 mLs Intramuscular Given 11/27/19 0450)    ED Course  I have reviewed the triage vital signs and the nursing notes.  Pertinent labs & imaging results that were available during my care of the patient were reviewed by me and considered in my medical decision making (see chart for details).    MDM Rules/Calculators/A&P                     Patient presents to the emergency department after a fall.  Patient complaining of left wrist pain as well as right leg pain.  X-ray of the right foot, ankle and hip obtained, no acute fractures noted.  She does have an abrasion on the dorsal aspect of the right foot.  Wound was cleaned, no repair needed.  Tetanus updated.  Patient had x-ray of the left wrist which shows a very tiny buckle fracture on the dorsal aspect of the distal radius.  Will place in a splint, follow-up with orthopedics.  Final Clinical Impression(s) / ED Diagnoses Final diagnoses:  Torus fracture of left wrist, initial encounter    Rx / DC Orders ED Discharge Orders    None       Rielly Brunn, Canary Brim, MD 11/27/19 [phone]

## 2019-11-27 NOTE — ED Notes (Signed)
ED Provider at bedside. 

## 2019-11-27 NOTE — ED Notes (Signed)
RN dressed skin tear to superior right foot with gauze wrap and compression bandage. Pt provided velcro left wrist splint and adult crutches for discharge.

## 2019-11-27 NOTE — ED Triage Notes (Signed)
Pt arrives via POV c/o injuries sustained from a fall. Pt reports being at a country club party and remembers people were leaving, then she remembers falling. Pt doesn't remember much else. Pt presents with right foot/ankle pain, with abrasions and bruising, and left wrist pain.

## 2019-11-29 ENCOUNTER — Encounter: Payer: Self-pay | Admitting: Orthopedic Surgery

## 2019-11-29 ENCOUNTER — Ambulatory Visit (INDEPENDENT_AMBULATORY_CARE_PROVIDER_SITE_OTHER): Payer: 59 | Admitting: Orthopedic Surgery

## 2019-11-29 ENCOUNTER — Other Ambulatory Visit: Payer: Self-pay

## 2019-11-29 VITALS — BP 146/96 | HR 109 | Ht 63.0 in | Wt 200.0 lb

## 2019-11-29 DIAGNOSIS — M25571 Pain in right ankle and joints of right foot: Secondary | ICD-10-CM

## 2019-11-29 DIAGNOSIS — W19XXXA Unspecified fall, initial encounter: Secondary | ICD-10-CM

## 2019-11-29 DIAGNOSIS — S52552A Other extraarticular fracture of lower end of left radius, initial encounter for closed fracture: Secondary | ICD-10-CM

## 2019-11-29 DIAGNOSIS — M79671 Pain in right foot: Secondary | ICD-10-CM | POA: Diagnosis not present

## 2019-11-29 NOTE — Patient Instructions (Addendum)
Weight-bear as tolerated use crutches as needed  Use appropriate cast care as described below  Use tylenol or advil for pain    Cast or Splint Care, Adult Casts and splints are supports that are worn to protect broken bones and other injuries. A cast or splint may hold a bone still and in the correct position while it heals. Casts and splints may also help to ease pain, swelling, and muscle spasms. How to care for your cast   Do not stick anything inside the cast to scratch your skin.  Check the skin around the cast every day. Tell your doctor about any concerns.  You may put lotion on dry skin around the edges of the cast. Do not put lotion on the skin under the cast.  Keep the cast clean.  If the cast is not waterproof: ? Do not let it get wet. ? Cover it with a watertight covering when you take a bath or a shower. How to care for your splint   Wear it as told by your doctor. Take it off only as told by your doctor.  Loosen the splint if your fingers or toes tingle, get numb, or turn cold and blue.  Keep the splint clean.  If the splint is not waterproof: ? Do not let it get wet. ? Cover it with a watertight covering when you take a bath or a shower. Follow these instructions at home: Bathing  Do not take baths or swim until your doctor says it is okay. Ask your doctor if you can take showers. You may only be allowed to take sponge baths for bathing.  If your cast or splint is not waterproof, cover it with a watertight covering when you take a bath or shower. Managing pain, stiffness, and swelling  Move your fingers or toes often to avoid stiffness and to lessen swelling.  Raise (elevate) the injured area above the level of your heart while sitting or lying down. Safety  Do not use the injured limb to support your body weight until your doctor says that it is okay.  Use crutches or other assistive devices as told by your doctor. General instructions  Do not put  pressure on any part of the cast or splint until it is fully hardened. This may take many hours.  Return to your normal activities as told by your doctor. Ask your doctor what activities are safe for you.  Keep all follow-up visits as told by your doctor. This is important. Contact a doctor if:  Your cast or splint gets damaged.  The skin around the cast gets red or raw.  The skin under the cast is very itchy or painful.  Your cast or splint feels very uncomfortable.  Your cast or splint is too tight or too loose.  Your cast becomes wet or it starts to have a soft spot or area.  You get an object stuck under your cast. Get help right away if:  Your pain gets worse.  The injured area tingles, gets numb, or turns blue and cold.  The part of your body above or below the cast is swollen and it turns a different color (is discolored).  You cannot feel or move your fingers or toes.  There is fluid leaking through the cast.  You have very bad pain or pressure under the cast.  You have trouble breathing.  You have shortness of breath.  You have chest pain. This information is not intended to  replace advice given to you by your health care provider. Make sure you discuss any questions you have with your health care provider. Document Revised: 01/20/2019 Document Reviewed: 09/20/2016 Elsevier Patient Education  2020 [employer]  Ankle Sprain  An ankle sprain is a stretch or tear in one of the tough tissues (ligaments) that connect the bones in your ankle. An ankle sprain can happen when the ankle rolls outward (inversion sprain) or inward (eversion sprain). What are the causes? This condition is caused by rolling or twisting the ankle. What increases the risk? You are more likely to develop this condition if you play sports. What are the signs or symptoms? Symptoms of this condition include:  Pain in your ankle.  Swelling.  Bruising. This may happen right after you  sprain your ankle or 1-2 days later.  Trouble standing or walking. How is this diagnosed? This condition is diagnosed with:  A physical exam. During the exam, your doctor will press on certain parts of your foot and ankle and try to move them in certain ways.  X-ray imaging. These may be taken to see how bad the sprain is and to check for broken bones. How is this treated? This condition may be treated with:  A brace or splint. This is used to keep the ankle from moving until it heals.  An elastic bandage. This is used to support the ankle.  Crutches.  Pain medicine.  Surgery. This may be needed if the sprain is very bad.  Physical therapy. This may help to improve movement in the ankle. Follow these instructions at home: If you have a brace or a splint:  Wear the brace or splint as told by your doctor. Remove it only as told by your doctor.  Loosen the brace or splint if your toes: ? Tingle. ? Lose feeling (become numb). ? Turn cold and blue.  Keep the brace or splint clean.  If the brace or splint is not waterproof: ? Do not let it get wet. ? Cover it with a watertight covering when you take a bath or a shower. If you have an elastic bandage (dressing):  Remove it to shower or bathe.  Try not to move your ankle much, but wiggle your toes from time to time. This helps to prevent swelling.  Adjust the dressing if it feels too tight.  Loosen the dressing if your foot: ? Loses feeling. ? Tingles. ? Becomes cold and blue. Managing pain, stiffness, and swelling   Take over-the-counter and prescription medicines only as told by doctor.  For 2-3 days, keep your ankle raised (elevated) above the level of your heart.  If told, put ice on the injured area: ? If you have a removable brace or splint, remove it as told by your doctor. ? Put ice in a plastic bag. ? Place a towel between your skin and the bag. ? Leave the ice on for 20 minutes, 2-3 times a day. General  instructions  Rest your ankle.  Do not use your injured leg to support your body weight until your doctor says that you can. Use crutches as told by your doctor.  Do not use any products that contain nicotine or tobacco, such as cigarettes, e-cigarettes, and chewing tobacco. If you need help quitting, ask your doctor.  Keep all follow-up visits as told by your doctor. Contact a doctor if:  Your bruises or swelling are quickly getting worse.  Your pain does not get better after you take  medicine. Get help right away if:  You cannot feel your toes or foot.  Your foot or toes look blue.  You have very bad pain that gets worse. Summary  An ankle sprain is a stretch or tear in one of the tough tissues (ligaments) that connect the bones in your ankle.  This condition is caused by rolling or twisting the ankle.  Symptoms include pain, swelling, bruising, and trouble walking.  To help with pain and swelling, put ice on the injured ankle, raise your ankle above the level of your heart, and use an elastic bandage. Also, rest as told by your doctor.  Keep all follow-up visits as told by your doctor. This is important. This information is not intended to replace advice given to you by your health care provider. Make sure you discuss any questions you have with your health care provider. Document Revised: 02/24/2018 Document Reviewed: 02/24/2018 Elsevier Patient Education  [place].

## 2019-11-29 NOTE — Progress Notes (Signed)
Marisa Johnson  11/29/2019  Body mass index is 35.43 kg/m.   HISTORY SECTION :  Chief Complaint  Patient presents with  . Wrist Injury    left wrist 11/27/19   . Ankle Injury    right    Marisa Johnson is a 43 y.o. female.   Patient presents to the emergency department for evaluation after a fall.  Patient reports that she was at a party earlier tonight.  She admits to drinking multiple alcoholic drinks.  When she was leaving the party she fell.  Patient reports that she had difficulty getting back up after the fall because of left wrist pain and right foot and ankle pain.  Her family helped her into the house and she went to sleep on the couch.  She woke up tonight and was still unable to walk, family convinced her to come to the ER for further evaluation.  Patient does not think she hit her head.  She does not have any headache.  No neck or back pain.  The following history was confirmed the patient was at a party she fell when she left the party her family helped her get up she went back to the couch slept woke up in the morning had trouble weightbearing and went to the ER where she was evaluated and found to have a left distal radius fracture nondisplaced she was placed in a splint she also complained of right hip right ankle right foot pain apparently was placed in a supportive device crutches and sent here for follow-up     Review of Systems  Musculoskeletal: Positive for myalgias.  All other systems reviewed and are negative.    has a past medical history of ADHD, Anxiety, Carpal tunnel syndrome, Connective tissue disorder [facility]), Contraceptive management (08/09/2015), Decreased libido (08/09/2015), Friable cervix (08/09/2015), History of abnormal cervical Pap smear (08/09/2015), Hypothyroidism, IBS (irritable bowel syndrome), Irregular intermenstrual bleeding (08/09/2015), Major depressive disorder, Migraine, Mixed connective tissue disease (HCC), Pseudobulbar affect, PTSD  (post-traumatic stress disorder), Sjogren's syndrome with lung involvement (HCC), Thyroid disease, and Vaginal Pap smear, abnormal.   Past Surgical History:  Procedure Laterality Date  . DILATION AND CURETTAGE OF UTERUS N/A 07/25/2017   Procedure: SUCTION DILATATION AND CURETTAGE;  Surgeon: Tilda Burrow, MD;  Location: [facility];  Service: Gynecology;  Laterality: N/A;  . DILITATION & CURRETTAGE/HYSTROSCOPY WITH NOVASURE ABLATION N/A 09/30/2017   Procedure: DILATATION, HYSTEROSCOPY WITH NOVASURE ENDOMETRIAL ABLATION;  Surgeon: Tilda Burrow, MD;  Location: [facility];  Service: Gynecology;  Laterality: N/A;  . ESOPHAGOGASTRODUODENOSCOPY N/A 11/17/2016   Procedure: ESOPHAGOGASTRODUODENOSCOPY (EGD);  Surgeon: Malissa Hippo, MD;  Location: [facility];  Service: Endoscopy;  Laterality: N/A;  . LAPAROSCOPIC BILATERAL SALPINGECTOMY Bilateral 09/30/2017   Procedure: LAPAROSCOPIC BILATERAL SALPINGECTOMY;  Surgeon: Tilda Burrow, MD;  Location: [facility];  Service: Gynecology;  Laterality: Bilateral;  . None      Body mass index is 35.43 kg/m.   Allergies  Allergen Reactions  . Abilify [Aripiprazole] Other (See Comments)    Reaction:  Restless leg syndrome   . Reglan [Metoclopramide] Other (See Comments)    Reaction:  Hallucinations   . Dexamethasone Rash     Current Outpatient Medications:  .  ADDERALL XR 30 MG 24 hr capsule, Take 30 mg by mouth daily., Disp: , Rfl:  .  AIMOVIG 70 MG/ML SOAJ, Inject 1 Syringe into the skin every 30 (thirty) days., Disp: , Rfl:  .  Brexpiprazole (REXULTI) 0.5 MG TABS,  Take by mouth., Disp: , Rfl:  .  buPROPion (WELLBUTRIN XL) 300 MG 24 hr tablet, Take 300 mg by mouth every evening. , Disp: , Rfl:  .  clonazePAM (KLONOPIN) 0.5 MG tablet, Take 0.5 mg by mouth 2 (two) times daily. , Disp: , Rfl:  .  lamoTRIgine (LAMICTAL) 200 MG tablet, Take 300 mg by mouth at bedtime. Takes 1.5 tablet, Disp: , Rfl:  .  levocetirizine (XYZAL) 5 MG tablet, Take 5 mg by  mouth at bedtime., Disp: , Rfl:  .  levothyroxine (SYNTHROID, LEVOTHROID) 137 MCG tablet, Take 150 mcg by mouth daily before breakfast. , Disp: , Rfl:  .  megestrol (MEGACE) 40 MG tablet, Take 1 tablet (40 mg total) by mouth daily. Beginning one week after completing provera, Disp: 45 tablet, Rfl: 2 .  traZODone (DESYREL) 100 MG tablet, Take 100 mg by mouth at bedtime., Disp: , Rfl:  .  medroxyPROGESTERone (PROVERA) 10 MG tablet, Take 1 tablet (10 mg total) by mouth daily. One tablet daily x 10 d to bring on menses (Patient not taking: Reported on 11/29/2019), Disp: 10 tablet, Rfl: 0   PHYSICAL EXAM SECTION: 1) BP (!) 146/96   Pulse (!) 109   Ht 5\' 3"  (1.6 m)   Wt 200 lb (90.7 kg)   BMI 35.43 kg/m   Body mass index is 35.43 kg/m. General appearance: Well-developed well-nourished no gross deformities  2) Cardiovascular normal pulse and perfusion , normal color   3) Neurologically deep tendon reflexes are equal and normal, no sensation loss or deficits no pathologic reflexes  4) Psychological: Awake alert and oriented x3 mood and affect normal  5) Skin wrist left side normal.  There is tenderness over the distal radius a small amount of abrasion over the right foot 6) Musculoskeletal:   Left wrist and hand minimal swelling neurovascular exam is intact skin is normal there is tenderness over the distal radius near the fracture seen on x-ray decreased range of motion in the hand and wrist are minor there is no instability.  Muscle tone is normal.  Right ankle, foot abrasion over the dorsum of the foot mild swelling cool to touch patient says she does not have any heat right now because the pain present Ankle ligaments feel tender   She has decreased range of motion ankle but ankle feels stable in all planes muscle tone is normal   MEDICAL DECISION MAKING  A.  Encounter Diagnoses  Name Primary?  . Other closed extra-articular fracture of distal end of left radius, initial encounter  Yes  . Acute right ankle pain   . Pain in right foot     B. DATA ANALYSED:  IMAGING: Independent interpretation of images:  Hip and pelvis right side normal anatomy no fracture or dislocation  Ankle right 3 views no fracture dislocation  Foot 3 views no fracture dislocation  Wrist 3 views plus scaphoid view no fracture dislocation seen in the scaphoid but there is a fracture nondisplaced distal radius  Outside records reviewed: Yes emergency room patient had a fall, all x-rays were negative tetanus was updated for an abrasion on the foot.  She was placed in a splint for her wrist fracture.  C. MANAGEMENT  Immobilization Aircast right ankle  Left wrist short arm cast  Weight-bear as tolerated  X-ray 3 weeks No orders of the defined types were placed in this encounter.     Arther Abbott, MD  11/29/2019 11:25 AM

## 2019-12-03 ENCOUNTER — Encounter: Payer: Self-pay | Admitting: Orthopedic Surgery

## 2019-12-03 ENCOUNTER — Ambulatory Visit (INDEPENDENT_AMBULATORY_CARE_PROVIDER_SITE_OTHER): Payer: 59 | Admitting: Orthopedic Surgery

## 2019-12-03 ENCOUNTER — Other Ambulatory Visit: Payer: Self-pay

## 2019-12-03 ENCOUNTER — Telehealth: Payer: Self-pay | Admitting: Radiology

## 2019-12-03 DIAGNOSIS — M25571 Pain in right ankle and joints of right foot: Secondary | ICD-10-CM

## 2019-12-03 DIAGNOSIS — M79671 Pain in right foot: Secondary | ICD-10-CM

## 2019-12-03 DIAGNOSIS — S52552A Other extraarticular fracture of lower end of left radius, initial encounter for closed fracture: Secondary | ICD-10-CM

## 2019-12-03 MED ORDER — ACETAMINOPHEN-CODEINE #3 300-30 MG PO TABS: 1.0000 | ORAL_TABLET | Freq: Four times a day (QID) | ORAL | 0 refills | Status: DC | PRN

## 2019-12-03 NOTE — Patient Instructions (Signed)
Keep appt

## 2019-12-03 NOTE — Telephone Encounter (Signed)
Patient called, LM VM that her foot is really hurting, and there is more swelling when she is sitting at work with it propped.  Also cold in her foot, and shooting pains in the leg/calf.  Worked in this am 1130 for eval.

## 2019-12-06 ENCOUNTER — Telehealth: Payer: Self-pay | Admitting: Orthopedic Surgery

## 2019-12-06 NOTE — Telephone Encounter (Signed)
Patient called to relay that she has spoken with her employer regarding her work status. May she receive a note for intermittent out of work, in event she needs to call off work due to medical reasons or medical appointments?

## 2019-12-06 NOTE — Progress Notes (Addendum)
Chief Complaint  Patient presents with  . Foot Pain    right ankle has foot pain shooting up leg 11/27/19 date of injury     45 year old female injured her left wrist on February 13 also injured her right foot and ankle no fractures were seen in the ankle and foot she was placed in a cast for the left wrist fracture  She presents back to the office see me days after being seen complain of increased pain in her foot  System review she says the foot feels cooler and is swelling  Exam shows that the foot does not look swollen to me it is tender there is an abrasion over the dorsal aspect she has sensory hypersensitivity over the dorsal aspect of the foot the ankle is stable she has more foot pain and ankle pain  Encounter Diagnoses  Name Primary?  . Acute right ankle pain   . Pain in right foot Yes  . Other closed extra-articular fracture of distal end of left radius, initial encounter    Recommend she go into a Cam walker instead of the Aircast that we had her in clinic  01/07/2020 11:45 AM  : Addendum: Patient concerned exam note indicates foot not swollen  "Does not look swollen to me"

## 2019-12-06 NOTE — Telephone Encounter (Signed)
yes

## 2019-12-07 ENCOUNTER — Telehealth: Payer: Self-pay | Admitting: Radiology

## 2019-12-07 NOTE — Telephone Encounter (Signed)
Patient called, asked to speak with you about her FMLA, and requests that you please call her.  Thanks.

## 2019-12-07 NOTE — Telephone Encounter (Signed)
Called patient to notify; reached voice mail; left message.

## 2019-12-08 ENCOUNTER — Encounter: Payer: Self-pay | Admitting: Orthopedic Surgery

## 2019-12-08 NOTE — Telephone Encounter (Signed)
Dpne 

## 2019-12-08 NOTE — Telephone Encounter (Signed)
Patient aware; forms received; patient aware of forms process. Letter also issued regarding intermittent Fmla.

## 2019-12-08 NOTE — Telephone Encounter (Signed)
Completed.

## 2019-12-14 ENCOUNTER — Ambulatory Visit (INDEPENDENT_AMBULATORY_CARE_PROVIDER_SITE_OTHER): Payer: 59 | Admitting: Orthopedic Surgery

## 2019-12-14 ENCOUNTER — Encounter: Payer: Self-pay | Admitting: Orthopedic Surgery

## 2019-12-14 ENCOUNTER — Other Ambulatory Visit: Payer: Self-pay

## 2019-12-14 ENCOUNTER — Ambulatory Visit: Payer: 59 | Admitting: Orthopedic Surgery

## 2019-12-14 DIAGNOSIS — S52552D Other extraarticular fracture of lower end of left radius, subsequent encounter for closed fracture with routine healing: Secondary | ICD-10-CM

## 2019-12-14 NOTE — Progress Notes (Signed)
Patient presented to clinic with wet short arm cast Was removed and replaced with new short arm cast patient tolerated well, will keep appointment as scheduled with Dr Romeo Apple.

## 2019-12-14 NOTE — Progress Notes (Signed)
Chief Complaint  Patient presents with  . Cast Problem    patients cast was redone today and she has returned states it is too tight   . Wrist Injury    11/27/2019    Fractured the wrist around February 13 placed in a cast on the 15th then came in today with the cast wet had a new cast placed and then came back because that cast was too tight.  Cast removed  This is a nondisplaced fracture recommend brace  Patient prefers

## 2019-12-16 DIAGNOSIS — S52502D Unspecified fracture of the lower end of left radius, subsequent encounter for closed fracture with routine healing: Secondary | ICD-10-CM | POA: Insufficient documentation

## 2019-12-20 ENCOUNTER — Encounter: Payer: Self-pay | Admitting: Orthopedic Surgery

## 2019-12-20 ENCOUNTER — Ambulatory Visit (INDEPENDENT_AMBULATORY_CARE_PROVIDER_SITE_OTHER): Payer: 59 | Admitting: Orthopedic Surgery

## 2019-12-20 ENCOUNTER — Other Ambulatory Visit: Payer: Self-pay

## 2019-12-20 ENCOUNTER — Ambulatory Visit: Payer: 59

## 2019-12-20 DIAGNOSIS — S52552D Other extraarticular fracture of lower end of left radius, subsequent encounter for closed fracture with routine healing: Secondary | ICD-10-CM | POA: Diagnosis not present

## 2019-12-20 NOTE — Progress Notes (Signed)
Chief Complaint  Patient presents with  . Wrist Injury    11/27/19 left wrist fracture     DAY 23  Marisa Johnson came in today to reevaluate her left wrist fracture with x-ray.  She still has pain on the dorsum of the wrist x-ray shows a small fracture there the alignment of the wrist is normal  She was placed in a fracture brace  She also has been diagnosed with multiple fractures in her foot.  I went back and looked at the x-rays that we have an radiologist and I both agree there was no obvious fractures in her foot.  This was diagnosed by Dr. Grayland Jack a podiatrist with an ultrasound so we will call him this week to see what kind of fractures she has which ever they are they must be very small and should be amenable to immobilization for period of 6 weeks which would end on March 26  She is weightbearing as tolerated in the cam walker and I will see her on the 26th  Encounter Diagnosis  Name Primary?  . Other closed extra-articular fracture of distal end of left radius with routine healing, subsequent encounter 11/27/19 Yes

## 2020-01-05 ENCOUNTER — Ambulatory Visit: Payer: 59 | Admitting: Obstetrics and Gynecology

## 2020-01-07 ENCOUNTER — Ambulatory Visit: Payer: 59

## 2020-01-07 ENCOUNTER — Ambulatory Visit (INDEPENDENT_AMBULATORY_CARE_PROVIDER_SITE_OTHER): Payer: 59 | Admitting: Orthopedic Surgery

## 2020-01-07 ENCOUNTER — Other Ambulatory Visit: Payer: Self-pay

## 2020-01-07 ENCOUNTER — Encounter: Payer: Self-pay | Admitting: Orthopedic Surgery

## 2020-01-07 DIAGNOSIS — S52552D Other extraarticular fracture of lower end of left radius, subsequent encounter for closed fracture with routine healing: Secondary | ICD-10-CM

## 2020-01-07 DIAGNOSIS — M79671 Pain in right foot: Secondary | ICD-10-CM

## 2020-01-07 DIAGNOSIS — M25571 Pain in right ankle and joints of right foot: Secondary | ICD-10-CM

## 2020-01-07 NOTE — Progress Notes (Signed)
No chief complaint on file.   Encounter Diagnoses  Name Primary?  . Other closed extra-articular fracture of distal end of left radius with routine healing, subsequent encounter 11/27/19 Yes  . Pain in right foot   . Acute right ankle pain     Eloni has several injuries she has a closed left distal radius fracture nondisplaced, she has a fracture in her right foot which was treated by podiatry and diagnosed with ultrasound.  Not noted on plain film  She is here for follow-up visit she is now 41 days post injuries   C/o stiffness left wrist and some rom deficit  [place]ijo still having the stiffness and some motion deficits but she is able to type well in the brace  I looked at some x-rays that she brought with a disc from the podiatrist and there are some fractures noted in the foot.  Again these were best seen on ultrasound and once the fracture started to heal fracture lines are more visible  As far as the wrist goes she can wean herself from the brace and follow-up in 6 weeks and and addendum was noted in her note from February 19

## 2020-01-07 NOTE — Patient Instructions (Signed)
Wean yourself from the brace , use as needed for comfort   F/u 6 weeks

## 2020-01-25 ENCOUNTER — Telehealth: Payer: Self-pay | Admitting: Radiology

## 2020-01-25 NOTE — Telephone Encounter (Signed)
Patient is asking if she can lift light dumbbells at the gym.  Please advise.

## 2020-01-25 NOTE — Telephone Encounter (Signed)
yes

## 2020-01-26 NOTE — Telephone Encounter (Signed)
I called LM advising.

## 2020-02-18 ENCOUNTER — Ambulatory Visit (INDEPENDENT_AMBULATORY_CARE_PROVIDER_SITE_OTHER): Payer: 59 | Admitting: Orthopedic Surgery

## 2020-02-18 ENCOUNTER — Encounter: Payer: Self-pay | Admitting: Orthopedic Surgery

## 2020-02-18 ENCOUNTER — Other Ambulatory Visit: Payer: Self-pay | Admitting: Orthopedic Surgery

## 2020-02-18 ENCOUNTER — Ambulatory Visit: Payer: 59

## 2020-02-18 ENCOUNTER — Other Ambulatory Visit: Payer: Self-pay

## 2020-02-18 VITALS — Temp 97.1°F

## 2020-02-18 DIAGNOSIS — M25532 Pain in left wrist: Secondary | ICD-10-CM

## 2020-02-18 DIAGNOSIS — M25571 Pain in right ankle and joints of right foot: Secondary | ICD-10-CM

## 2020-02-18 NOTE — Progress Notes (Signed)
Chief Complaint  Patient presents with  . Follow-up    Recheck on left wrist, DOI 11-27-19. New injury 01-31-20.   44 year old female fell again complains of pain in her left wrist and forearm as well as her left hand.  System review no numbness or tingling no skin abrasions or breaks in the skin  Past Medical History:  Diagnosis Date  . ADHD   . Anxiety   . Carpal tunnel syndrome   . Connective tissue disorder (HCC)   . Contraceptive management 08/09/2015  . Decreased libido 08/09/2015  . Friable cervix 08/09/2015  . History of abnormal cervical Pap smear 08/09/2015  . Hypothyroidism   . IBS (irritable bowel syndrome)   . Irregular intermenstrual bleeding 08/09/2015  . Major depressive disorder   . Migraine   . Mixed connective tissue disease (HCC)   . Pseudobulbar affect   . PTSD (post-traumatic stress disorder)   . Sjogren's syndrome with lung involvement (HCC)   . Thyroid disease   . Vaginal Pap smear, abnormal     Temp (!) 97.1 F (36.2 C)   She is awake alert and oriented x3 mood and affect are normal we have incomplete vital signs today  Gait and station are intact  Left wrist is marked with X is around the areas that are painful and there probably 6-8 axis. The skin is intact muscle tone is normal wrist and elbow are stable and nontender painful wrist extension swelling in the soft tissues on the hand and wrist pulse and color normal sensation intact  Repeat wrist images left wrist normal including the hand with no fracture  Patient opted for casting  Short arm cast applied  Acute uncomplicated illness low risk of morbidity  Encounter Diagnosis  Name Primary?  . Pain in left wrist Yes     Last OV  Encounter Diagnoses  Name Primary?  . Other closed extra-articular fracture of distal end of left radius with routine healing, subsequent encounter 11/27/19 Yes  . Pain in right foot   . Acute right ankle pain     Marisa Johnson has several injuries she has a  closed left distal radius fracture nondisplaced, she has a fracture in her right foot which was treated by podiatry and diagnosed with ultrasound.  Not noted on plain film  She is here for follow-up visit she is now 41 days post injuries   C/o stiffness left wrist and some rom deficit  Marisa Johnson still having the stiffness and some motion deficits but she is able to type well in the brace  I looked at some x-rays that she brought with a disc from the podiatrist and there are some fractures noted in the foot.  Again these were best seen on ultrasound and once the fracture started to heal fracture lines are more visible  As far as the wrist goes she can wean herself from the brace and follow-up in 6 weeks and and addendum was noted in her note from February 19   Cast

## 2020-03-11 ENCOUNTER — Emergency Department (HOSPITAL_COMMUNITY)
Admission: EM | Admit: 2020-03-11 | Discharge: 2020-03-11 | Disposition: A | Discharge status: Home / Self Care | Payer: 59 | Attending: Emergency Medicine | Admitting: Emergency Medicine

## 2020-03-11 ENCOUNTER — Emergency Department (HOSPITAL_COMMUNITY): Payer: 59

## 2020-03-11 ENCOUNTER — Encounter (HOSPITAL_COMMUNITY): Payer: Self-pay

## 2020-03-11 ENCOUNTER — Other Ambulatory Visit: Payer: Self-pay

## 2020-03-11 DIAGNOSIS — W109XXA Fall (on) (from) unspecified stairs and steps, initial encounter: Secondary | ICD-10-CM | POA: Diagnosis not present

## 2020-03-11 DIAGNOSIS — Y999 Unspecified external cause status: Secondary | ICD-10-CM | POA: Insufficient documentation

## 2020-03-11 DIAGNOSIS — M25562 Pain in left knee: Secondary | ICD-10-CM | POA: Diagnosis not present

## 2020-03-11 DIAGNOSIS — W19XXXA Unspecified fall, initial encounter: Secondary | ICD-10-CM

## 2020-03-11 DIAGNOSIS — Y92009 Unspecified place in unspecified non-institutional (private) residence as the place of occurrence of the external cause: Secondary | ICD-10-CM | POA: Insufficient documentation

## 2020-03-11 DIAGNOSIS — R0789 Other chest pain: Secondary | ICD-10-CM | POA: Insufficient documentation

## 2020-03-11 DIAGNOSIS — M542 Cervicalgia: Secondary | ICD-10-CM | POA: Diagnosis not present

## 2020-03-11 DIAGNOSIS — Z79899 Other long term (current) drug therapy: Secondary | ICD-10-CM | POA: Insufficient documentation

## 2020-03-11 DIAGNOSIS — E039 Hypothyroidism, unspecified: Secondary | ICD-10-CM | POA: Diagnosis not present

## 2020-03-11 DIAGNOSIS — F909 Attention-deficit hyperactivity disorder, unspecified type: Secondary | ICD-10-CM | POA: Diagnosis not present

## 2020-03-11 DIAGNOSIS — S0990XA Unspecified injury of head, initial encounter: Secondary | ICD-10-CM

## 2020-03-11 DIAGNOSIS — M79671 Pain in right foot: Secondary | ICD-10-CM | POA: Diagnosis not present

## 2020-03-11 DIAGNOSIS — Y9301 Activity, walking, marching and hiking: Secondary | ICD-10-CM | POA: Insufficient documentation

## 2020-03-11 MED ORDER — CYCLOBENZAPRINE HCL 10 MG PO TABS
10.0000 mg | ORAL_TABLET | Freq: Three times a day (TID) | ORAL | 0 refills | Status: DC | PRN
Start: 2020-03-11 — End: 2020-03-31

## 2020-03-11 MED ORDER — LIDOCAINE 5 % EX PTCH: 1.0000 | MEDICATED_PATCH | CUTANEOUS | 0 refills | Status: DC

## 2020-03-11 NOTE — Discharge Instructions (Addendum)
As discussed, your CT scans and x-rays do not show any new broken bones.  You may have a mild headache and some fatigue as a result of your head injury.  The symptoms may persist for several days to weeks.  There is a likely reinjury of your right ankle.  I would recommend that you wear your cam boot for support when weightbearing.  You may wear the ASO on your left ankle also as needed with weightbearing.  Elevate and apply ice packs on and off to your injuries.  Follow-up with your orthopedic provider next week for recheck.  Return to the emergency department if you develop any worsening symptoms regarding sudden onset of severe headache, visual changes, vomiting or sudden changes in standing or walking.

## 2020-03-11 NOTE — ED Provider Notes (Signed)
[facility] EMERGENCY DEPARTMENT Provider Note   CSN: [id number] Arrival date & time: 03/11/20  0930     History Chief Complaint  Patient presents with  . Fall    Marisa Johnson is a 44 y.o. female.  HPI      Marisa Johnson is a 44 y.o. female with past medical history of anxiety, ADHD, and is currently being treated by orthopedics for a closed fracture of the distal radius, she presents to the Emergency Department requesting evaluation for multiple injuries secondary to a mechanical fall that occurred earlier this morning.  Dates that she tripped and fell down 13 carpeted steps in her home.  She does states that she has a new prescription of progressive lenses in her glasses and believes this may have resulted in her fall.  She does report history of several falls recently that have required orthopedic evaluation.  This morning, she states that she struck her head during the fall, but denies loss of consciousness.  She does report left-sided headache and dizziness.  She complains of injuries to her right foot, left knee, bilateral ribs head and right neck.  She denies nausea, vomiting, abdominal pain, shortness of breath, numbness or weakness of the lower extremities.  No symptoms prior to the fall.    Past Medical History:  Diagnosis Date  . ADHD   . Anxiety   . Carpal tunnel syndrome   . Connective tissue disorder (HCC)   . Contraceptive management 08/09/2015  . Decreased libido 08/09/2015  . Friable cervix 08/09/2015  . History of abnormal cervical Pap smear 08/09/2015  . Hypothyroidism   . IBS (irritable bowel syndrome)   . Irregular intermenstrual bleeding 08/09/2015  . Major depressive disorder   . Migraine   . Mixed connective tissue disease (HCC)   . Pseudobulbar affect   . PTSD (post-traumatic stress disorder)   . Sjogren's syndrome with lung involvement (HCC)   . Thyroid disease   . Vaginal Pap smear, abnormal     Patient Active Problem List   Diagnosis Date  Noted  . Closed fracture of lower end of left radius with routine healing 12/16/2019  . Routine screening for STI (sexually transmitted infection) 10/06/2019  . Cervicitis and endocervicitis 02/11/2018  . History of abortion 07/25/2017  . Endometritis 07/25/2017  . Pelvic pain 07/25/2017  . Dyspnea 05/03/2017  . SOB (shortness of breath) 05/03/2017  . 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  . Sjogren's syndrome with lung involvement (HCC) 03/15/2017  . Irregular intermenstrual bleeding 08/09/2015  . Decreased libido 08/09/2015  . Friable cervix 08/09/2015  . Contraceptive management 08/09/2015  . History of abnormal cervical Pap smear 08/09/2015  . Abdominal pain, left lower quadrant 09/21/2013  . Nausea and vomiting 09/20/2013  . Irritable bowel syndrome 05/29/2010  . CLOSED FRACTURE OF HEAD OF RADIUS 04/13/2008  . FX CLOSED FIBULA NOS 04/13/2008    Past Surgical History:  Procedure Laterality Date  . DILATION AND CURETTAGE OF UTERUS N/A 07/25/2017   Procedure: SUCTION DILATATION AND CURETTAGE;  Surgeon: Tilda Burrow, MD;  Location: [facility] ORS;  Service: Gynecology;  Laterality: N/A;  . DILITATION & CURRETTAGE/HYSTROSCOPY WITH NOVASURE ABLATION N/A 09/30/2017   Procedure: DILATATION, HYSTEROSCOPY WITH NOVASURE ENDOMETRIAL ABLATION;  Surgeon: Tilda Burrow, MD;  Location: [facility] ORS;  Service: Gynecology;  Laterality: N/A;  . ESOPHAGOGASTRODUODENOSCOPY N/A 11/17/2016   Procedure: ESOPHAGOGASTRODUODENOSCOPY (EGD);  Surgeon: Malissa Hippo, MD;  Location: [facility] ENDO SUITE;  Service: Endoscopy;  Laterality: N/A;  . LAPAROSCOPIC BILATERAL  SALPINGECTOMY Bilateral 09/30/2017   Procedure: LAPAROSCOPIC BILATERAL SALPINGECTOMY;  Surgeon: Tilda Burrow, MD;  Location: [facility] ORS;  Service: Gynecology;  Laterality: Bilateral;  . None       OB History    Gravida  5   Para  3   Term  3   Preterm      AB  2   Living  3     SAB  1   TAB  1   Ectopic      Multiple      Live  Births              Family History  Problem Relation Age of Onset  . Diabetes Mother   . Hypertension Mother   . Hypothyroidism Mother   . Hypertension Father   . Stroke Father   . Cancer Father        prostate  . Cancer Maternal Grandmother   . Kidney disease Paternal Grandmother   . Hypertension Paternal Grandmother   . Hyperlipidemia Paternal Grandmother   . Heart disease Paternal Grandmother   . Heart attack Paternal Grandmother   . Diabetes Daughter   . Hypothyroidism Daughter   . Anxiety disorder Sister   . Anxiety disorder Brother   . Anxiety disorder Sister   . Colon cancer Neg Hx     Social History   Tobacco Use  . Smoking status: Never Smoker  . Smokeless tobacco: Never Used  . Tobacco comment: Never smoker  Substance Use Topics  . Alcohol use: Yes    Comment: occ.  . Drug use: No    Home Medications Prior to Admission medications   Medication Sig Start Date End Date Taking? Authorizing Provider  acetaminophen-codeine (TYLENOL #3) 300-30 MG tablet Take 1 tablet by mouth every 6 (six) hours as needed for moderate pain. 12/03/19   Vickki Hearing, MD  ADDERALL XR 30 MG 24 hr capsule Take 30 mg by mouth daily. 09/22/19   [provider]  AIMOVIG 70 MG/ML SOAJ Inject 1 Syringe into the skin every 30 (thirty) days. 08/16/19   [provider]  Brexpiprazole (REXULTI) 0.5 MG TABS Take by mouth.    [provider]  buPROPion (WELLBUTRIN XL) 300 MG 24 hr tablet Take 300 mg by mouth every evening.     [provider]  clonazePAM (KLONOPIN) 0.5 MG tablet Take 0.5 mg by mouth 2 (two) times daily.     [provider]  cyclobenzaprine (FLEXERIL) 10 MG tablet Take 10 mg by mouth 3 (three) times daily as needed for muscle spasms.    [provider]  lamoTRIgine (LAMICTAL) 200 MG tablet Take 300 mg by mouth at bedtime. Takes 1.5 tablet    [provider]  levocetirizine (XYZAL) 5 MG tablet Take 5 mg by mouth  at bedtime. 09/13/19   [provider]  levothyroxine (SYNTHROID, LEVOTHROID) 137 MCG tablet Take 150 mcg by mouth daily before breakfast.     [provider]  medroxyPROGESTERone (PROVERA) 10 MG tablet Take 1 tablet (10 mg total) by mouth daily. One tablet daily x 10 d to bring on menses Patient not taking: Reported on 12/20/2019 10/06/19   Tilda Burrow, MD  megestrol (MEGACE) 40 MG tablet Take 1 tablet (40 mg total) by mouth daily. Beginning one week after completing provera 10/06/19   Tilda Burrow, MD  meloxicam (MOBIC) 7.5 MG tablet Take 7.5 mg by mouth daily.    [provider]  traZODone (  DESYREL) 100 MG tablet Take 100 mg by mouth at bedtime.    [provider]    Allergies    Abilify [aripiprazole], Reglan [metoclopramide], and Dexamethasone  Review of Systems   Review of Systems  Constitutional: Negative for chills and fever.  Eyes: Negative for visual disturbance.  Respiratory: Negative for chest tightness and shortness of breath.   Cardiovascular: Positive for chest pain (Bilateral rib pain).  Gastrointestinal: Negative for abdominal pain, diarrhea, nausea and vomiting.  Genitourinary: Negative for decreased urine volume, difficulty urinating, dysuria and flank pain.  Musculoskeletal: Positive for arthralgias and joint swelling. Negative for back pain.  Skin: Negative for color change and wound.  Neurological: Positive for dizziness and headaches. Negative for syncope, speech difficulty, weakness and numbness.  Hematological: Does not bruise/bleed easily.  Psychiatric/Behavioral: Negative for confusion. The patient is not nervous/anxious.     Physical Exam Updated Vital Signs BP 117/85 (BP Location: Right Arm)   Pulse (!) 101   Temp 98.3 F (36.8 C) (Oral)   Resp 18   Ht 5\' 3"  (1.6 m)   Wt 88.5 kg   SpO2 98%   BMI 34.54 kg/m   Physical Exam Vitals and nursing note reviewed.  Constitutional:      Appearance: Normal  appearance. She is not toxic-appearing.  HENT:     Head:     Comments: Focal area of tenderness along the left parietal scalp.  Small, 2 cm hematoma noted without abrasion.  No lacerations of the scalp.    Nose: Nose normal.     Mouth/Throat:     Mouth: Mucous membranes are moist.  Eyes:     Extraocular Movements: Extraocular movements intact.     Conjunctiva/sclera: Conjunctivae normal.     Pupils: Pupils are equal, round, and reactive to light.  Cardiovascular:     Rate and Rhythm: Normal rate and regular rhythm.     Pulses: Normal pulses.  Pulmonary:     Effort: Pulmonary effort is normal.  Chest:     Chest wall: No tenderness.  Abdominal:     Palpations: Abdomen is soft.     Tenderness: There is no abdominal tenderness.  Musculoskeletal:        General: Tenderness and signs of injury present. No deformity.     Cervical back: Tenderness (Tenderness to palpation of the right cervical paraspinal muscles.  No midline tenderness.) present.     Comments: Tender to palpation of the right distal wrist, left anterior knee, and dorsal right foot.  Left foot non-tender, mild tenderness of left ankle.  No bony deformities.  No edema. Pt has cast placed on left wrist.  Compartments are soft.  Skin:    General: Skin is warm.     Capillary Refill: Capillary refill takes less than 2 seconds.     Findings: No erythema.  Neurological:     General: No focal deficit present.     Mental Status: She is alert.     Sensory: No sensory deficit.     Motor: No weakness.  Psychiatric:        Mood and Affect: Mood normal.     ED Results / Procedures / Treatments   Labs (all labs ordered are listed, but only abnormal results are displayed) Labs Reviewed - No data to display  EKG None  Radiology DG Ribs Bilateral W/Chest  Result Date: 03/11/2020 CLINICAL DATA:  Recent fall down stairs this morning with chest pain, initial encounter EXAM: BILATERAL RIBS AND CHEST - 4+  VIEW COMPARISON:   10/22/2018 FINDINGS: Cardiac shadow is within normal limits. The lungs are clear. No acute rib abnormality is noted. No pneumothorax is seen. IMPRESSION: No acute rib fracture noted. Electronically Signed   By: Alcide Clever M.D.   On: 03/11/2020 11:32   DG Wrist Complete Right  Result Date: 03/11/2020 CLINICAL DATA:  Larey Seat down steps at home this morning. Right wrist pain. Initial encounter. EXAM: RIGHT WRIST - COMPLETE 3+ VIEW COMPARISON:  None. FINDINGS: There is no evidence of fracture or dislocation. There is no evidence of arthropathy or other focal bone abnormality. Soft tissues are unremarkable. IMPRESSION: Negative. Electronically Signed   By: Danae Orleans M.D.   On: 03/11/2020 11:26   DG Ankle Complete Left  Result Date: 03/11/2020 CLINICAL DATA:  Larey Seat down steps at home this morning. Left ankle pain. Initial encounter. EXAM: LEFT ANKLE COMPLETE - 3+ VIEW COMPARISON:  None. FINDINGS: There is no evidence of fracture, dislocation, or joint effusion. There is no evidence of arthropathy or other focal bone abnormality. Soft tissues are unremarkable. IMPRESSION: Negative. Electronically Signed   By: Danae Orleans M.D.   On: 03/11/2020 11:26   DG Ankle Complete Right  Result Date: 03/11/2020 CLINICAL DATA:  Recent fall downstairs with ankle pain, initial encounter EXAM: RIGHT ANKLE - COMPLETE 3+ VIEW COMPARISON:  11/27/2019 FINDINGS: Mild soft tissue swelling is noted laterally. 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 Mild tibiotalar degenerative changes are noted. IMPRESSION: Degenerative change without acute abnormality. Electronically Signed   By: Alcide Clever M.D.   On: 03/11/2020 11:34   CT Head Wo Contrast  Result Date: 03/11/2020 CLINICAL DATA:  Pain following fall EXAM: CT HEAD WITHOUT CONTRAST CT CERVICAL SPINE WITHOUT CONTRAST TECHNIQUE: Multidetector CT imaging of the head and cervical spine  was performed following the standard protocol without intravenous contrast. Multiplanar CT image reconstructions of the cervical spine were also generated. COMPARISON:  None. FINDINGS: CT HEAD FINDINGS Brain: Ventricles and sulci are normal in size and configuration. There is no intracranial mass, hemorrhage, extra-axial fluid collection, or midline shift. The brain parenchyma appears unremarkable. No evident acute infarct. Vascular: No hyperdense vessel. No appreciable vascular calcification. Skull: The bony calvarium appears intact. Sinuses/Orbits: There is opacification of much of the left sphenoid sinus. There is mucosal thickening in several ethmoid air cells. Other visualized paranasal sinuses are clear. Orbits appear symmetric bilaterally. Other: The mastoid air cells are clear. CT CERVICAL SPINE FINDINGS Alignment: There is no appreciable spondylolisthesis. Skull base and vertebrae: The skull base and craniocervical junction regions appear normal. There is no demonstrable fracture. No blastic or lytic bone lesions. Soft tissues and spinal canal: Prevertebral soft tissues and predental space regions are normal. There is no evident cord or canal hematoma. No paraspinous lesions are evident. Disc levels: The disc spaces appear unremarkable. There is slight facet hypertrophy at several levels. There is no nerve root edema or effacement. No disc extrusion or stenosis. Upper chest: Visualized upper lung regions are clear. Other: None IMPRESSION: CT head: Areas of paranasal sinus disease. Study otherwise unremarkable. CT cervical spine: No fracture or spondylolisthesis. Slight osteoarthritic change at several levels without nerve root edema or effacement. No disc extrusion or stenosis. Electronically Signed   By: Bretta Bang III M.D.   On: 03/11/2020 11:44   CT Cervical Spine Wo Contrast  Result Date: 03/11/2020 CLINICAL DATA:  Pain following  fall EXAM: CT HEAD WITHOUT CONTRAST CT CERVICAL SPINE WITHOUT  CONTRAST TECHNIQUE: Multidetector CT imaging of the head and cervical spine was performed following the standard protocol without intravenous contrast. Multiplanar CT image reconstructions of the cervical spine were also generated. COMPARISON:  None. FINDINGS: CT HEAD FINDINGS Brain: Ventricles and sulci are normal in size and configuration. There is no intracranial mass, hemorrhage, extra-axial fluid collection, or midline shift. The brain parenchyma appears unremarkable. No evident acute infarct. Vascular: No hyperdense vessel. No appreciable vascular calcification. Skull: The bony calvarium appears intact. Sinuses/Orbits: There is opacification of much of the left sphenoid sinus. There is mucosal thickening in several ethmoid air cells. Other visualized paranasal sinuses are clear. Orbits appear symmetric bilaterally. Other: The mastoid air cells are clear. CT CERVICAL SPINE FINDINGS Alignment: There is no appreciable spondylolisthesis. Skull base and vertebrae: The skull base and craniocervical junction regions appear normal. There is no demonstrable fracture. No blastic or lytic bone lesions. Soft tissues and spinal canal: Prevertebral soft tissues and predental space regions are normal. There is no evident cord or canal hematoma. No paraspinous lesions are evident. Disc levels: The disc spaces appear unremarkable. There is slight facet hypertrophy at several levels. There is no nerve root edema or effacement. No disc extrusion or stenosis. Upper chest: Visualized upper lung regions are clear. Other: None IMPRESSION: CT head: Areas of paranasal sinus disease. Study otherwise unremarkable. CT cervical spine: No fracture or spondylolisthesis. Slight osteoarthritic change at several levels without nerve root edema or effacement. No disc extrusion or stenosis. Electronically Signed   By: Bretta Bang III M.D.   On: 03/11/2020 11:44   DG Knee Complete 4 Views Left  Result Date: 03/11/2020 CLINICAL DATA:   Larey Seat down steps at home this morning. Left knee pain. Initial encounter. EXAM: LEFT KNEE - COMPLETE 4+ VIEW COMPARISON:  None. FINDINGS: No evidence of fracture, dislocation, or joint effusion. No evidence of arthropathy or other focal bone abnormality. Soft tissues are unremarkable. IMPRESSION: Negative. Electronically Signed   By: Danae Orleans M.D.   On: 03/11/2020 11:27   DG Foot Complete Right  Result Date: 03/11/2020 CLINICAL DATA:  Recent fall with foot and ankle pain, initial encounter EXAM: RIGHT FOOT COMPLETE - 3+ VIEW COMPARISON:  None. FINDINGS: There is well corticated bony density identified adjacent to the lateral malleolus consistent with prior trauma and nonunion. Mild lateral soft tissue swelling is seen. No other bony fracture is seen. 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 No acute fracture is seen. Electronically Signed   By: Alcide Clever M.D.   On: 03/11/2020 11:31   DG Hips Bilat W or Wo Pelvis 3-4 Views  Result Date: 03/11/2020 CLINICAL DATA:  Fall downstairs with hip pain EXAM: DG HIP (WITH OR WITHOUT PELVIS) 4V BILAT COMPARISON:  None. FINDINGS: Pelvic ring is intact. Degenerative changes of the hip joints are noted bilaterally. No fracture or dislocation is noted. Pubic symphysis degenerative changes are noted as well. No soft tissue abnormality is seen. IMPRESSION: Degenerative change without acute abnormality. Electronically Signed   By: Alcide Clever M.D.   On: 03/11/2020 11:36    Procedures Procedures (including critical care time)  Medications Ordered in ED Medications - No data to display  ED Course  I have reviewed the triage vital signs and the nursing notes.  Pertinent labs & imaging results that were available during my care of the patient were reviewed by me and considered in my medical decision  making (see chart for details).    MDM Rules/Calculators/A&P                      Patient here for evaluation of  multiple injuries secondary to mechanical fall.  Reported head injury without LOC.  She is neurovascularly intact.  No focal neuro deficits on exam.  Multiple extremity x-rays ordered along with CT of head and C-spine.  On recheck, patient resting comfortably.  No complaints.  Vitals reviewed.   I have ordered and reviewed results of CT head, C-spine, right wrist, bilateral ribs with chest, left knee, bilateral ankle and foot x-rays all without acute bony injuries.  Injuries felt to be musculoskeletal.  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  I have discussed x-ray and CT findings with the patient.  She has her cam walker boot at home from her previous injury, I have recommended that she wear her cam boot, ASO applied to left ankle.  I have explained head injury instructions and symptoms that warrant ER return.  She agrees to RICE therapy and close follow up with her orthopedic provider.   Final Clinical Impression(s) / ED Diagnoses Final diagnoses:  Fall  Fall, initial encounter  Injury of head, initial encounter    Rx / DC Orders ED Discharge Orders    None       Pauline Ausriplett, Elizeth Weinrich, PA-C 03/11/20 1245    Eber HongMiller, Brian, MD 03/13/20 [phone]

## 2020-03-11 NOTE — ED Triage Notes (Addendum)
Pt reports falling down 13 steps in house and landed on hardwood floor. Pt reports not sure why she fell possibly due to not use of progressive lens. Pt reports that she has been having frequent falls. Psychiatrist wants her to have a full work up by neuro due to frequent falls, Pt reporting she hit her head and is dizzy, no loss of consciousness. Bilateral wrist pain. Left and right rib pain . Ankle pain and feet pain. And right side neck pain

## 2020-03-14 ENCOUNTER — Other Ambulatory Visit: Payer: Self-pay | Admitting: Neurology

## 2020-03-14 ENCOUNTER — Ambulatory Visit: Payer: 59 | Admitting: Orthopedic Surgery

## 2020-03-14 ENCOUNTER — Other Ambulatory Visit (HOSPITAL_COMMUNITY): Payer: Self-pay | Admitting: Neurology

## 2020-03-14 ENCOUNTER — Telehealth: Payer: Self-pay | Admitting: Radiology

## 2020-03-14 ENCOUNTER — Telehealth: Payer: Self-pay | Admitting: Orthopedic Surgery

## 2020-03-14 DIAGNOSIS — G43701 Chronic migraine without aura, not intractable, with status migrainosus: Secondary | ICD-10-CM | POA: Insufficient documentation

## 2020-03-14 DIAGNOSIS — R296 Repeated falls: Secondary | ICD-10-CM

## 2020-03-14 DIAGNOSIS — G959 Disease of spinal cord, unspecified: Secondary | ICD-10-CM

## 2020-03-14 NOTE — Telephone Encounter (Signed)
Patient came to office regarding concern about her cast on left wrist, for which she was scheduled for this afternoon, and for which she came to office at 11:45am this morning - clinical staff took care of cast. Aware of next scheduled appointment. States was seen at [facility] Emergency room for right foot/ankle, due to a fall. States she has been recently treated for the ankle by Dr [facility]. Has CD; signed request for notes for Dr Romeo Apple to review and advise prior to scheduling. Emergency department report indicates nonunion fracture. Pending.

## 2020-03-14 NOTE — Telephone Encounter (Addendum)
Patient walked in today and stated cast soft. It was noted to be soft at palm, have reinforced cast with another layer of pink cast material.   Patient reports multiple falls , reports falling down multiple steps. Has appointment with Neurologist already.  She also states cast loose, can pull off if she tried. I advised her not to pull on the cast, and it is loose intentionally, so she may still be able to type, with the cast on.  She voiced understanding, stopped at desk after cast repair to make follow up appointment  To you [facility]

## 2020-03-20 ENCOUNTER — Ambulatory Visit (INDEPENDENT_AMBULATORY_CARE_PROVIDER_SITE_OTHER): Payer: 59 | Admitting: Orthopedic Surgery

## 2020-03-20 ENCOUNTER — Other Ambulatory Visit: Payer: Self-pay

## 2020-03-20 ENCOUNTER — Encounter: Payer: Self-pay | Admitting: Orthopedic Surgery

## 2020-03-20 ENCOUNTER — Ambulatory Visit: Payer: 59

## 2020-03-20 DIAGNOSIS — M25532 Pain in left wrist: Secondary | ICD-10-CM | POA: Diagnosis not present

## 2020-03-20 NOTE — Progress Notes (Signed)
Chief Complaint  Patient presents with  . Follow-up    Recheck on left wrist, DOI 11-27-19   44 yo F to fall since I saw her last on the seventh  Cast off exam shows tenderness over the distal radius and first extensor compartment pain with ulnar deviation  X-rays negative  Brace  Could have some cast pressure over the first extensor compartment  Follow-up in 4 weeks  Encounter Diagnosis  Name Primary?  . Pain in left wrist Yes

## 2020-03-31 ENCOUNTER — Encounter (HOSPITAL_COMMUNITY): Payer: Self-pay

## 2020-03-31 ENCOUNTER — Telehealth: Payer: Self-pay | Admitting: Obstetrics and Gynecology

## 2020-03-31 ENCOUNTER — Telehealth: Payer: Self-pay | Admitting: Orthopedic Surgery

## 2020-03-31 ENCOUNTER — Emergency Department (HOSPITAL_COMMUNITY): Payer: 59

## 2020-03-31 ENCOUNTER — Other Ambulatory Visit: Payer: Self-pay

## 2020-03-31 ENCOUNTER — Other Ambulatory Visit: Payer: Self-pay | Admitting: *Deleted

## 2020-03-31 ENCOUNTER — Emergency Department (HOSPITAL_COMMUNITY)
Admission: EM | Admit: 2020-03-31 | Discharge: 2020-03-31 | Disposition: A | Discharge status: Home / Self Care | Payer: 59 | Attending: Emergency Medicine | Admitting: Emergency Medicine

## 2020-03-31 DIAGNOSIS — E039 Hypothyroidism, unspecified: Secondary | ICD-10-CM | POA: Diagnosis not present

## 2020-03-31 DIAGNOSIS — G43019 Migraine without aura, intractable, without status migrainosus: Secondary | ICD-10-CM | POA: Diagnosis not present

## 2020-03-31 DIAGNOSIS — G43909 Migraine, unspecified, not intractable, without status migrainosus: Secondary | ICD-10-CM | POA: Diagnosis present

## 2020-03-31 DIAGNOSIS — Z79899 Other long term (current) drug therapy: Secondary | ICD-10-CM | POA: Insufficient documentation

## 2020-03-31 LAB — I-STAT CHEM 8, ED
BUN: 12 mg/dL (ref 6–20)
Calcium, Ion: 1.23 mmol/L (ref 1.15–1.40)
Chloride: 104 mmol/L (ref 98–111)
Creatinine, Ser: 1.2 mg/dL — ABNORMAL HIGH (ref 0.44–1.00)
Glucose, Bld: 100 mg/dL — ABNORMAL HIGH (ref 70–99)
HCT: 44 % (ref 36.0–46.0)
Hemoglobin: 15 g/dL (ref 12.0–15.0)
Potassium: 2.9 mmol/L — ABNORMAL LOW (ref 3.5–5.1)
Sodium: 144 mmol/L (ref 135–145)
TCO2: 25 mmol/L (ref 22–32)

## 2020-03-31 LAB — COMPREHENSIVE METABOLIC PANEL
ALT: 29 U/L (ref 0–44)
AST: 28 U/L (ref 15–41)
Albumin: 4.6 g/dL (ref 3.5–5.0)
Alkaline Phosphatase: 84 U/L (ref 38–126)
Anion gap: 16 — ABNORMAL HIGH (ref 5–15)
BUN: 12 mg/dL (ref 6–20)
CO2: 24 mmol/L (ref 22–32)
Calcium: 9.7 mg/dL (ref 8.9–10.3)
Chloride: 100 mmol/L (ref 98–111)
Creatinine, Ser: 1.18 mg/dL — ABNORMAL HIGH (ref 0.44–1.00)
GFR calc Af Amer: >60 mL/min (ref >60)
GFR calc non Af Amer: 56 mL/min — ABNORMAL LOW (ref >60)
Glucose, Bld: 91 mg/dL (ref 70–99)
Potassium: 2.8 mmol/L — ABNORMAL LOW (ref 3.5–5.1)
Sodium: 140 mmol/L (ref 135–145)
Total Bilirubin: 0.8 mg/dL (ref 0.3–1.2)
Total Protein: 8 g/dL (ref 6.5–8.1)

## 2020-03-31 LAB — DIFFERENTIAL
Abs Immature Granulocytes: 0.01 10*3/uL (ref 0.00–0.07)
Basophils Absolute: 0.1 10*3/uL (ref 0.0–0.1)
Basophils Relative: 1 %
Eosinophils Absolute: 0.1 10*3/uL (ref 0.0–0.5)
Eosinophils Relative: 1 %
Immature Granulocytes: 0 %
Lymphocytes Relative: 36 %
Lymphs Abs: 2.9 10*3/uL (ref 0.7–4.0)
Monocytes Absolute: 1 10*3/uL (ref 0.1–1.0)
Monocytes Relative: 13 %
Neutro Abs: 4 10*3/uL (ref 1.7–7.7)
Neutrophils Relative %: 49 %

## 2020-03-31 LAB — URINALYSIS, ROUTINE W REFLEX MICROSCOPIC
Bacteria, UA: NONE SEEN
Bilirubin Urine: NEGATIVE
Glucose, UA: NEGATIVE mg/dL
Ketones, ur: NEGATIVE mg/dL
Nitrite: NEGATIVE
Protein, ur: NEGATIVE mg/dL
Specific Gravity, Urine: 1.03 (ref 1.005–1.030)
pH: 6 (ref 5.0–8.0)

## 2020-03-31 LAB — RAPID URINE DRUG SCREEN, HOSP PERFORMED
Amphetamines: POSITIVE — AB
Barbiturates: NOT DETECTED
Benzodiazepines: NOT DETECTED
Cocaine: NOT DETECTED
Opiates: NOT DETECTED
Tetrahydrocannabinol: NOT DETECTED

## 2020-03-31 LAB — CBC
HCT: 43.6 % (ref 36.0–46.0)
Hemoglobin: 14.5 g/dL (ref 12.0–15.0)
MCH: 31.7 pg (ref 26.0–34.0)
MCHC: 33.3 g/dL (ref 30.0–36.0)
MCV: 95.2 fL (ref 80.0–100.0)
Platelets: 348 10*3/uL (ref 150–400)
RBC: 4.58 MIL/uL (ref 3.87–5.11)
RDW: 13.8 % (ref 11.5–15.5)
WBC: 8.1 10*3/uL (ref 4.0–10.5)
nRBC: 0 % (ref 0.0–0.2)

## 2020-03-31 LAB — PROTIME-INR
INR: 1.1 (ref 0.8–1.2)
Prothrombin Time: 13.4 seconds (ref 11.4–15.2)

## 2020-03-31 LAB — APTT: aPTT: 26 seconds (ref 24–36)

## 2020-03-31 LAB — CBG MONITORING, ED: Glucose-Capillary: 107 mg/dL — ABNORMAL HIGH (ref 70–99)

## 2020-03-31 LAB — ETHANOL: Alcohol, Ethyl (B): <10 mg/dL (ref <10)

## 2020-03-31 LAB — POC URINE PREG, ED: Preg Test, Ur: NEGATIVE

## 2020-03-31 MED ORDER — HYDROCODONE-ACETAMINOPHEN 5-325 MG PO TABS
1.0000 | ORAL_TABLET | Freq: Once | ORAL | Status: AC
  Administered 2020-03-31: 1 via ORAL
  Filled 2020-03-31: qty 1

## 2020-03-31 MED ORDER — KETOROLAC TROMETHAMINE 30 MG/ML IJ SOLN
30.0000 mg | Freq: Once | INTRAMUSCULAR | Status: AC
  Administered 2020-03-31: 30 mg via INTRAVENOUS
  Filled 2020-03-31: qty 1

## 2020-03-31 MED ORDER — IOHEXOL 350 MG/ML SOLN
75.0000 mL | Freq: Once | INTRAVENOUS | Status: AC | PRN
  Administered 2020-03-31: 75 mL via INTRAVENOUS

## 2020-03-31 MED ORDER — POTASSIUM CHLORIDE 10 MEQ/100ML IV SOLN
10.0000 meq | Freq: Once | INTRAVENOUS | Status: AC
  Administered 2020-03-31: 10 meq via INTRAVENOUS
  Filled 2020-03-31: qty 100

## 2020-03-31 NOTE — Telephone Encounter (Signed)
Patient called back after we were unable to connect on phone - said her left hand pinky finger and also wrist up to elbow started hurting through the night. Said she is at work typing and it is hurting. York Spaniel has prescription for Meloxicam but states it could take up to 2 weeks for the medication to be effective.  Patient is aware of next scheduled appointment 04/18/20. Please advise.

## 2020-03-31 NOTE — Telephone Encounter (Signed)
Patient relayed it has increased since cast was taken off, and also with typing at work. Appointment 04/18/20 is follow up for this same wrist injury. Does more time need to be set?

## 2020-03-31 NOTE — Telephone Encounter (Signed)
Called patient back and she is running out of megace. Requesting refill until she can be seen. Routed refill request to Dr. Emelda Fear.

## 2020-03-31 NOTE — ED Notes (Signed)
CT notified of code stroke

## 2020-03-31 NOTE — Telephone Encounter (Signed)
I sent her a my chart message  She will let us know if we need to provide a work note.

## 2020-03-31 NOTE — ED Provider Notes (Signed)
[facility] EMERGENCY DEPARTMENT Provider Note   CSN: [id number] Arrival date & time: 03/31/20  1145  An emergency department physician performed an initial assessment on this suspected stroke patient at 57.  History Chief Complaint  Patient presents with   Migraine    Marisa Johnson is a 44 y.o. female.  Patient complains of a headache with some blurred vision and numbness to her face.  Her neurologist sent her over here to get CT angio of the head neck  The history is provided by the patient. No language interpreter was used.  Migraine This is a recurrent problem. The current episode started 6 to 12 hours ago. The problem occurs constantly. The problem has not changed since onset.Associated symptoms include headaches. Pertinent negatives include no chest pain and no abdominal pain. Nothing aggravates the symptoms. Nothing relieves the symptoms. She has tried nothing for the symptoms. The treatment provided no relief.       Past Medical History:  Diagnosis Date   ADHD    Anxiety    Carpal tunnel syndrome    Connective tissue disorder [facility])    Contraceptive management 08/09/2015   Decreased libido 08/09/2015   Friable cervix 08/09/2015   History of abnormal cervical Pap smear 08/09/2015   Hypothyroidism    IBS (irritable bowel syndrome)    Irregular intermenstrual bleeding 08/09/2015   Major depressive disorder    Migraine    Mixed connective tissue disease ([place])    Pseudobulbar affect    PTSD (post-traumatic stress disorder)    Sjogren's syndrome with lung involvement ([place])    Thyroid disease    Vaginal Pap smear, abnormal     Patient Active Problem List   Diagnosis Date Noted   Closed fracture of lower end of left radius with routine healing 12/16/2019   Routine screening for STI (sexually transmitted infection) 10/06/2019   Cervicitis and endocervicitis 02/11/2018   History of abortion 07/25/2017   Endometritis 07/25/2017   Pelvic  pain 07/25/2017   Dyspnea 05/03/2017   SOB (shortness of breath) 05/03/2017   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[place]) 03/15/2017   Sjogren's syndrome with lung involvement ([place]) 03/15/2017   Irregular intermenstrual bleeding 08/09/2015   Decreased libido 08/09/2015   Friable cervix 08/09/2015   Contraceptive management 08/09/2015   History of abnormal cervical Pap smear 08/09/2015   Abdominal pain, left lower quadrant 09/21/2013   Nausea and vomiting 09/20/2013   Irritable bowel syndrome 05/29/2010   CLOSED FRACTURE OF HEAD OF RADIUS 04/13/2008   FX CLOSED FIBULA NOS 04/13/2008    Past Surgical History:  Procedure Laterality Date   DILATION AND CURETTAGE OF UTERUS N/A 07/25/2017   Procedure: SUCTION DILATATION AND CURETTAGE;  Surgeon: Jonnie Kind, MD;  Location: [facility];  Service: Gynecology;  Laterality: N/A;   DILITATION & CURRETTAGE/HYSTROSCOPY WITH NOVASURE ABLATION N/A 09/30/2017   Procedure: DILATATION, HYSTEROSCOPY WITH NOVASURE ENDOMETRIAL ABLATION;  Surgeon: Jonnie Kind, MD;  Location: [facility];  Service: Gynecology;  Laterality: N/A;   ESOPHAGOGASTRODUODENOSCOPY N/A 11/17/2016   Procedure: ESOPHAGOGASTRODUODENOSCOPY (EGD);  Surgeon: Rogene Houston, MD;  Location: [facility] END[facility] SUITE;  Service: Endoscopy;  Laterality: N/A;   LAPAROSCOPIC BILATERAL SALPINGECTOMY Bilateral 09/30/2017   Procedure: LAPAROSCOPIC BILATERAL SALPINGECTOMY;  Surgeon: Jonnie Kind, MD;  Location: [facility];  Service: Gynecology;  Laterality: Bilateral;   None       OB History    Gravida  5   Para  3   Term  3   Preterm  AB  2   Living  3     SAB  1   TAB  1   Ectopic      Multiple      Live Births              Family History  Problem Relation Age of Onset   Diabetes Mother    Hypertension Mother    Hypothyroidism Mother    Hypertension Father    Stroke Father    Cancer Father        prostate   Cancer Maternal Grandmother    Kidney disease  Paternal Grandmother    Hypertension Paternal Grandmother    Hyperlipidemia Paternal Grandmother    Heart disease Paternal Grandmother    Heart attack Paternal Grandmother    Diabetes Daughter    Hypothyroidism Daughter    Anxiety disorder Sister    Anxiety disorder Brother    Anxiety disorder Sister    Colon cancer Neg Hx     Social History   Tobacco Use   Smoking status: Never Smoker   Smokeless tobacco: Never Used   Tobacco comment: Never smoker  Vaping Use   Vaping Use: Never used  Substance Use Topics   Alcohol use: Yes    Comment: occ.   Drug use: No    Home Medications Prior to Admission medications   Medication Sig Start Date End Date Taking? Authorizing Provider  ADDERALL XR 30 MG 24 hr capsule Take 30 mg by mouth daily. 09/22/19  Yes [provider]  AIMOVIG 70 MG/ML SOAJ Inject 1 Syringe into the skin every 30 (thirty) days. 08/16/19  Yes [provider]  Brexpiprazole (REXULTI) 0.5 MG TABS Take 1 tablet by mouth daily.    Yes [provider]  buPROPion (WELLBUTRIN XL) 150 MG 24 hr tablet Take 150 mg by mouth every evening.    Yes [provider]  lamoTRIgine (LAMICTAL) 200 MG tablet Take 300 mg by mouth at bedtime. Takes 1.5 tablet   Yes [provider]  levocetirizine (XYZAL) 5 MG tablet Take 5 mg by mouth at bedtime. 09/13/19  Yes [provider]  levothyroxine (SYNTHROID) 150 MCG tablet Take 150 mcg by mouth every morning. 03/08/20  Yes [provider]  megestrol (MEGACE) 40 MG tablet Take 1 tablet (40 mg total) by mouth daily. Beginning one week after completing provera 10/06/19  Yes Tilda Burrow, MD  promethazine (PHENERGAN) 25 MG tablet Take 25 mg by mouth every 6 (six) hours as needed. 03/25/20  Yes [provider]  zolpidem (AMBIEN) 10 MG tablet Take 10 mg by mouth daily. 03/14/20  Yes [provider]  clonazePAM (KLONOPIN) 0.5 MG tablet Take 0.5 mg by mouth  daily.     [provider]    Allergies    Abilify [aripiprazole], Other, Reglan [metoclopramide], and Dexamethasone  Review of Systems   Review of Systems  Constitutional: Negative for appetite change and fatigue.  HENT: Negative for congestion, ear discharge and sinus pressure.        Mild blurred vision  Eyes: Negative for discharge.  Respiratory: Negative for cough.   Cardiovascular: Negative for chest pain.  Gastrointestinal: Negative for abdominal pain and diarrhea.  Genitourinary: Negative for frequency and hematuria.  Musculoskeletal: Negative for back pain.  Skin: Negative for rash.  Neurological: Positive for headaches. Negative for seizures.  Psychiatric/Behavioral: Negative for hallucinations.    Physical Exam Updated Vital Signs BP (!) 145/94    Pulse (!) 105  Temp 98.3 F (36.8 C) (Oral)    Resp 18    Ht 5\' 3"  (1.6 m)    Wt 89.4 kg    SpO2 97%    BMI 34.90 kg/m   Physical Exam Vitals and nursing note reviewed.  Constitutional:      Appearance: She is well-developed.  HENT:     Head: Normocephalic.     Nose: Nose normal.  Eyes:     General: No scleral icterus.    Conjunctiva/sclera: Conjunctivae normal.  Neck:     Thyroid: No thyromegaly.  Cardiovascular:     Rate and Rhythm: Normal rate and regular rhythm.     Heart sounds: No murmur heard.  No friction rub. No gallop.   Pulmonary:     Breath sounds: No stridor. No wheezing or rales.  Chest:     Chest wall: No tenderness.  Abdominal:     General: There is no distension.     Tenderness: There is no abdominal tenderness. There is no rebound.  Musculoskeletal:        General: Normal range of motion.     Cervical back: Neck supple.  Lymphadenopathy:     Cervical: No cervical adenopathy.  Skin:    Findings: No erythema or rash.  Neurological:     Mental Status: She is alert and oriented to person, place, and time.     Motor: No abnormal muscle tone.     Coordination: Coordination  normal.  Psychiatric:        Behavior: Behavior normal.     ED Results / Procedures / Treatments   Labs (all labs ordered are listed, but only abnormal results are displayed) Labs Reviewed  COMPREHENSIVE METABOLIC PANEL - Abnormal; Notable for the following components:      Result Value   Potassium 2.8 (*)    Creatinine, Ser 1.18 (*)    GFR calc non Af Amer 56 (*)    Anion gap 16 (*)    All other components within normal limits  RAPID URINE DRUG SCREEN, HOSP PERFORMED - Abnormal; Notable for the following components:   Amphetamines POSITIVE (*)    All other components within normal limits  URINALYSIS, ROUTINE W REFLEX MICROSCOPIC - Abnormal; Notable for the following components:   Hgb urine dipstick MODERATE (*)    Leukocytes,Ua SMALL (*)    All other components within normal limits  I-STAT CHEM 8, ED - Abnormal; Notable for the following components:   Potassium 2.9 (*)    Creatinine, Ser 1.20 (*)    Glucose, Bld 100 (*)    All other components within normal limits  CBG MONITORING, ED - Abnormal; Notable for the following components:   Glucose-Capillary 107 (*)    All other components within normal limits  ETHANOL  PROTIME-INR  APTT  CBC  DIFFERENTIAL  POC URINE PREG, ED    EKG None  Radiology CT Angio Head W or Wo Contrast  Result Date: 03/31/2020 CLINICAL DATA:  Stroke. Headache behind right eye with vision change EXAM: CT ANGIOGRAPHY HEAD AND NECK TECHNIQUE: Multidetector CT imaging of the head and neck was performed using the standard protocol during bolus administration of intravenous contrast. Multiplanar CT image reconstructions and MIPs were obtained to evaluate the vascular anatomy. Carotid stenosis measurements (when applicable) are obtained utilizing NASCET criteria, using the distal internal carotid diameter as the denominator. CONTRAST:  75mL OMNIPAQUE IOHEXOL 350 MG/ML SOLN COMPARISON:  CT head 03/31/2020 FINDINGS: CTA NECK FINDINGS Aortic arch: Standard  branching. Imaged  portion shows no evidence of aneurysm or dissection. No significant stenosis of the major arch vessel origins. Right carotid system: Normal right carotid. Negative for atherosclerotic disease or dissection Left carotid system: Normal left carotid. Negative for atherosclerotic disease or dissection. Vertebral arteries: Both vertebral arteries widely patent to the basilar without stenosis or dissection. Skeleton: Negative Other neck: Cervical adenopathy bilaterally. Right level 2 lymph node 14 mm. Multiple posterior lymph nodes measuring up to 10 mm. Left level 2 lymph node 14 mm with multiple posterior lymph nodes on the left measuring up to 30mm. Negative pharynx. Upper chest: Lung apices clear bilaterally. Review of the MIP images confirms the above findings CTA HEAD FINDINGS Anterior circulation: Normal cavernous carotid bilaterally. No stenosis or aneurysm. Anterior and middle cerebral arteries normal bilaterally without stenosis or aneurysm. Posterior circulation: Both vertebral arteries patent to the basilar. PICA patent bilaterally. Basilar is tortuous but widely patent. Superior cerebellar and posterior cerebral arteries patent bilaterally. Negative for aneurysm. Venous sinuses: Normal venous enhancement Anatomic variants: None Review of the MIP images confirms the above findings IMPRESSION: Negative CTA head and neck. Negative for aneurysm or vascular malformation. Mild cervical adenopathy bilaterally. These may be reactive nodes however lymphoma is a consideration. Correlate with any recent infection. Clinical follow-up and biopsy suggested if there is progression. Electronically Signed   By: Marlan Palau M.D.   On: 03/31/2020 13:38   CT Angio Neck W and/or Wo Contrast  Result Date: 03/31/2020 CLINICAL DATA:  Stroke. Headache behind right eye with vision change EXAM: CT ANGIOGRAPHY HEAD AND NECK TECHNIQUE: Multidetector CT imaging of the head and neck was performed using the standard  protocol during bolus administration of intravenous contrast. Multiplanar CT image reconstructions and MIPs were obtained to evaluate the vascular anatomy. Carotid stenosis measurements (when applicable) are obtained utilizing NASCET criteria, using the distal internal carotid diameter as the denominator. CONTRAST:  4mL OMNIPAQUE IOHEXOL 350 MG/ML SOLN COMPARISON:  CT head 03/31/2020 FINDINGS: CTA NECK FINDINGS Aortic arch: Standard branching. Imaged portion shows no evidence of aneurysm or dissection. No significant stenosis of the major arch vessel origins. Right carotid system: Normal right carotid. Negative for atherosclerotic disease or dissection Left carotid system: Normal left carotid. Negative for atherosclerotic disease or dissection. Vertebral arteries: Both vertebral arteries widely patent to the basilar without stenosis or dissection. Skeleton: Negative Other neck: Cervical adenopathy bilaterally. Right level 2 lymph node 14 mm. Multiple posterior lymph nodes measuring up to 10 mm. Left level 2 lymph node 14 mm with multiple posterior lymph nodes on the left measuring up to 66mm. Negative pharynx. Upper chest: Lung apices clear bilaterally. Review of the MIP images confirms the above findings CTA HEAD FINDINGS Anterior circulation: Normal cavernous carotid bilaterally. No stenosis or aneurysm. Anterior and middle cerebral arteries normal bilaterally without stenosis or aneurysm. Posterior circulation: Both vertebral arteries patent to the basilar. PICA patent bilaterally. Basilar is tortuous but widely patent. Superior cerebellar and posterior cerebral arteries patent bilaterally. Negative for aneurysm. Venous sinuses: Normal venous enhancement Anatomic variants: None Review of the MIP images confirms the above findings IMPRESSION: Negative CTA head and neck. Negative for aneurysm or vascular malformation. Mild cervical adenopathy bilaterally. These may be reactive nodes however lymphoma is a  consideration. Correlate with any recent infection. Clinical follow-up and biopsy suggested if there is progression. Electronically Signed   By: Marlan Palau M.D.   On: 03/31/2020 13:38   CT HEAD CODE STROKE WO CONTRAST  Result Date: 03/31/2020 CLINICAL DATA:  Code stroke.  Right-sided visual changes and facial tingling yesterday. Today numbness in the right lower extremity. EXAM: CT HEAD WITHOUT CONTRAST TECHNIQUE: Contiguous axial images were obtained from the base of the skull through the vertex without intravenous contrast. COMPARISON:  CT head without contrast 03/11/2020. MR head without contrast 07/10/2016 FINDINGS: Brain: No acute infarct, hemorrhage, or mass lesion is present. The ventricles are of normal size. No significant white matter lesions are present. No significant extraaxial fluid collection is present. The brainstem and cerebellum are within normal limits. Vascular: No hyperdense vessel or unexpected calcification. Skull: Calvarium is intact. No focal lytic or blastic lesions are present. No significant extracranial soft tissue lesion is present. Sinuses/Orbits: The paranasal sinuses and mastoid air cells are clear. Scratched at chronic left sphenoid sinus disease is again noted. The paranasal sinuses and mastoid air cells are otherwise clear. The globes and orbits are within normal limits. ASPECTS [facility] Stroke Program Early CT Score) - Ganglionic level infarction (caudate, lentiform nuclei, internal capsule, insula, M1-M3 cortex): 7/7 - Supraganglionic infarction (M4-M6 cortex): 3/3 Total score (0-10 with 10 being normal): /10 IMPRESSION: 1. Normal CT appearance of the brain. 2. ASPECTS is 10/10. 3. Chronic left sphenoid sinus disease. These results were called by telephone at the time of interpretation on 03/31/2020 at 12:19 pm to provider [facility] Aivan Fillingim , who verbally acknowledged these results. Electronically Signed   By: Marin Roberts M.D.   On: 03/31/2020 12:20     Procedures Procedures (including critical care time)  Medications Ordered in ED Medications  HYDROcodone-acetaminophen (NORCO/VICODIN) 5-325 MG per tablet 1 tablet (has no administration in time range)  potassium chloride 10 mEq in 100 mL IVPB ( Intravenous Stopped 03/31/20 1326)  iohexol (OMNIPAQUE) 350 MG/ML injection 75 mL (75 mLs Intravenous Contrast Given 03/31/20 1300)  ketorolac (TORADOL) 30 MG/ML injection 30 mg (30 mg Intravenous Given 03/31/20 1404)    ED Course  I have reviewed the triage vital signs and the nursing notes.  Pertinent labs & imaging results that were available during my care of the patient were reviewed by me and considered in my medical decision making (see chart for details). Patient had negative CT head and CT angio of the head neck.  I spoke with her neurologist and he was comfortable with discharging the patient home on a baby aspirin a day and follow-up in the office.   MDM Rules/Calculators/A&P                          Patient with migraine headache causing blurred vision numbness in her face that has resolved.  Patient will follow up with her neurologist Dr. Gerilyn Pilgrim         This patient presents to the ED for concern of headache, this involves an extensive number of treatment options, and is a complaint that carries with it a high risk of complications and morbidity.  The differential diagnosis includes stroke migraine   Lab Tests:   I Ordered, reviewed, and interpreted labs, which included CBC chemistries which show potassium low  Medicines ordered:   I ordered medication Toradol for migraine  Imaging Studies ordered:   I ordered imaging studies which included CT head negative and  I independently visualized and interpreted imaging which showed negative  Additional history obtained:   Additional history obtained from patient neurologist  Previous records obtained and reviewed   Consultations Obtained:   I consulted  neurology and discussed lab and imaging findings  Reevaluation:  After  the interventions stated above, I reevaluated the patient and found improvement  Critical Interventions:     Final Clinical Impression(s) / ED Diagnoses Final diagnoses:  Intractable migraine without aura and without status migrainosus    Rx / DC Orders ED Discharge Orders    None       Bethann Berkshire, MD 04/03/20 1101

## 2020-03-31 NOTE — Discharge Instructions (Addendum)
Take one baby aspirin a day and follow-up with Dr. Gerilyn Pilgrim as planned.  Get your blood pressure checked next week by your family doctor

## 2020-03-31 NOTE — Telephone Encounter (Signed)
My advice is to stop typing  Avoid activity that puts the elbow in a flexed/bent position  May need OOW

## 2020-03-31 NOTE — Consult Note (Signed)
[facility] Tele[facility]urology Consult Services   Date of Service:   03/31/2020 12:11:06  Impression:     .  R51 - HA (Headache)  Comments/Sign-Out: 44 year old female who presents with headache and right side tingling. Presentation is likely secondary complex migraine.  Metrics: Last Known Well: 03/31/2020 09:30:00 [facility] Notification Time: 03/31/2020 12:11:05 Arrival Time: 03/31/2020 11:45:00 Stamp Time: 03/31/2020 12:11:06 Time First Login Attempt: 03/31/2020 12:17:00 Symptoms: Right side numbness and tingling and migraine NIHSS Start Assessment Time: 03/31/2020 12:21:58 Patient is not a candidate for Alteplase/Activase. Alteplase Medical Decision: 03/31/2020 12:27:51 Patient was not deemed candidate for Alteplase/Activase thrombolytics because of following reasons: No disabling symptoms.  CT head showed no acute hemorrhage or acute core infarct.  ED Physician notified of diagnostic impression and management plan on 03/31/2020 12:31:51  Advanced Imaging: Advanced Imaging Not Recommended because:  Clinical Presentation is not Suggestive of LVO and NIHSS is <6   Our recommendations are outlined below.  Recommendations:     .  Activate Stroke Protocol Admission/Order Set     .  Stroke/Telemetry Floor     .  Neuro Checks     .  Bedside Swallow Eval     .  DVT Prophylaxis     .  IV Fluids, Normal Saline     .  Head of Bed 30 Degrees     .  Euglycemia and Avoid Hyperthermia (PRN Acetaminophen)     .  Antiplatelet Therapy Recommended  Routine Consultation with [facility] for Follow up Care  Sign Out:     .  Discussed with Emergency Department Provider    ------------------------------------------------------------------------------  History of Present Illness: Patient is a 44 year old Female.  Patient was brought by private transportation with symptoms of Right side numbness and tingling and migraine  44 year old female with a  history of migraine headaches on Aimovig who presented to the hospital because of right side numbness and tingling in the setting of a typical migraine. Patient usually gets migraine headaches behind her right eye. She also reports frequent falls and is being evaluation for cervical spine abnormalities but this is the first time she has had numbness and tingling related to her migraines. She initially had 2 episodes yesterday and but that resolved after Excedrin Migraine. This morning around 9:30 she again started with a headache and then the odd feeling on her right side.           Examination: BP(153/100), Pulse(118), Blood Glucose(107) 1A: Level of Consciousness - Alert; keenly responsive + 0 1B: Ask Month and Age - Both Questions Right + 0 1C: Blink Eyes & Squeeze Hands - Performs Both Tasks + 0 2: Test Horizontal Extraocular Movements - Normal + 0 3: Test Visual Fields - No Visual Loss + 0 4: Test Facial Palsy (Use Grimace if Obtunded) - Normal symmetry + 0 5A: Test Left Arm Motor Drift - No Drift for 10 Seconds + 0 5B: Test Right Arm Motor Drift - No Drift for 10 Seconds + 0 6A: Test Left Leg Motor Drift - No Drift for 5 Seconds + 0 6B: Test Right Leg Motor Drift - No Drift for 5 Seconds + 0 7: Test Limb Ataxia (FNF/Heel-Shin) - No Ataxia + 0 8: Test Sensation - Mild-Moderate Loss: Less Sharp/More Dull + 1 9: Test Language/Aphasia - Normal; No aphasia + 0 10: Test Dysarthria - Normal + 0 11: Test Extinction/Inattention - No abnormality + 0  NIHSS Score: 1  Pre-Morbid Modified  Ranking Scale: 0 Points = No symptoms at all   Patient/Family was informed the Neurology Consult would occur via TeleHealth consult by way of interactive audio and video telecommunications and consented to receiving care in this manner.   Patient is being evaluated for possible acute neurologic impairment and high probability of imminent or life-threatening deterioration. I spent total of 30 minutes  providing care to this patient, including time for face to face visit via telemedicine, review of medical records, imaging studies and discussion of findings with providers, the patient and/or family.   Dr Joice Lofts   [facility] [phone]  Case [id number]

## 2020-03-31 NOTE — ED Triage Notes (Signed)
Pt reports history of migraines and takes aimovig monthy.  Reports had migraine behind r eye yesterday and had vision changes and r sided facial tingling twice yesterday.  This morning at 0930 started having symptoms again but this time she also had numbness down r leg.  Pt says called Dr. Gerilyn Pilgrim and he wanted pt to come to ED and have a CTA.  Reports bp was elevated in his office 2 weeks ago.  Pt says r leg is not numb anymore but still having tingling in r foot and r side of face.

## 2020-03-31 NOTE — Telephone Encounter (Signed)
Patient called with question about wrist - lost phone connection; I called back to patient; reached, and she asked if she can call us back, as she was on her other line.

## 2020-03-31 NOTE — Telephone Encounter (Signed)
We can look at this when she comes in on 04/18/20  Do we have enough space blocked for the new problem? c

## 2020-03-31 NOTE — Progress Notes (Signed)
1157 CALL TIME 1157 BEEPER TIME 1207 EXAM STARTED 1209 EXAM FINISHED 1210 IMAGES SENT TO SOC 1210 EXAM COMPLETED IN Epic 1211 [facility] CALLED

## 2020-03-31 NOTE — Telephone Encounter (Signed)
Will ask Dr Romeo Apple

## 2020-03-31 NOTE — Telephone Encounter (Signed)
Patient states medicine was only filled with 14 tablets and took her last dose last night and wants to know if she can get another refill or have an apt for a refill.

## 2020-04-01 MED ORDER — MEGESTROL ACETATE 40 MG PO TABS: 40.0000 mg | ORAL_TABLET | Freq: Every day | ORAL | 2 refills | Status: DC

## 2020-04-04 NOTE — Telephone Encounter (Signed)
Noted  

## 2020-04-05 ENCOUNTER — Ambulatory Visit (HOSPITAL_COMMUNITY): Payer: 59

## 2020-04-05 ENCOUNTER — Encounter (HOSPITAL_COMMUNITY): Payer: Self-pay

## 2020-04-18 ENCOUNTER — Ambulatory Visit (INDEPENDENT_AMBULATORY_CARE_PROVIDER_SITE_OTHER): Payer: 59 | Admitting: Orthopedic Surgery

## 2020-04-18 ENCOUNTER — Other Ambulatory Visit: Payer: Self-pay

## 2020-04-18 ENCOUNTER — Encounter: Payer: Self-pay | Admitting: Orthopedic Surgery

## 2020-04-18 VITALS — BP 150/98 | HR 82 | Ht 63.0 in | Wt 195.0 lb

## 2020-04-18 DIAGNOSIS — M25532 Pain in left wrist: Secondary | ICD-10-CM

## 2020-04-18 NOTE — Progress Notes (Signed)
Chief Complaint  Patient presents with  . Wrist Injury    01/31/20 left wrist injury/ reinjury still has some pain base of left thumb     Marisa Johnson complains of some pain over the left wrist near the first extensor compartment has some mild discomfort with ulnar deviation of the wrist and pain which runs up the forearm over the first extensor compartment and then proximally to just below the elbow  Examination shows tenderness over the first extensor compartment and normal motion of the thumb including the extensor pollicis longus and flexor pollicis longus  Impression possible de Quervain's syndrome possible compression of the sensory nerve from multiple cast and braces  Recommend no immobilization regular activities return if needed  Encounter Diagnosis  Name Primary?  . Pain in left wrist Yes

## 2020-05-04 ENCOUNTER — Other Ambulatory Visit: Payer: Self-pay

## 2020-05-04 ENCOUNTER — Ambulatory Visit (HOSPITAL_COMMUNITY)
Admission: RE | Admit: 2020-05-04 | Discharge: 2020-05-04 | Disposition: A | Discharge status: Home / Self Care | Payer: 59 | Source: Ambulatory Visit | Attending: Neurology | Admitting: Neurology

## 2020-05-04 DIAGNOSIS — R296 Repeated falls: Secondary | ICD-10-CM | POA: Diagnosis not present

## 2020-05-04 DIAGNOSIS — G959 Disease of spinal cord, unspecified: Secondary | ICD-10-CM

## 2020-05-05 ENCOUNTER — Other Ambulatory Visit (HOSPITAL_COMMUNITY): Payer: Self-pay | Admitting: Internal Medicine

## 2020-05-05 ENCOUNTER — Encounter (HOSPITAL_COMMUNITY): Payer: Self-pay

## 2020-05-05 ENCOUNTER — Other Ambulatory Visit: Payer: Self-pay

## 2020-05-05 DIAGNOSIS — E039 Hypothyroidism, unspecified: Secondary | ICD-10-CM

## 2020-05-05 NOTE — Progress Notes (Signed)
I spoke with the patient today during an introductory phone call. I introduced myself as well as my role in the patient's care. I briefly explained what to expect during the patient's initial visit with Dr. Ellin Saba. Patient expresses no barriers to care at this time. Patient was given the opprotunity to ask questions and all were answered to her satisfaction. I provided my contact information. I will meet with the patient during her initial visit.

## 2020-05-08 ENCOUNTER — Other Ambulatory Visit: Payer: Self-pay

## 2020-05-08 ENCOUNTER — Encounter (HOSPITAL_COMMUNITY): Payer: Self-pay

## 2020-05-08 ENCOUNTER — Inpatient Hospital Stay (HOSPITAL_COMMUNITY): Payer: 59

## 2020-05-08 ENCOUNTER — Encounter (HOSPITAL_COMMUNITY): Payer: Self-pay | Admitting: Hematology

## 2020-05-08 ENCOUNTER — Inpatient Hospital Stay (HOSPITAL_COMMUNITY): Payer: 59 | Attending: Hematology | Admitting: Hematology

## 2020-05-08 VITALS — BP 124/85 | HR 70 | Temp 98.2°F | Resp 18 | Ht 63.0 in | Wt 203.0 lb

## 2020-05-08 DIAGNOSIS — Z801 Family history of malignant neoplasm of trachea, bronchus and lung: Secondary | ICD-10-CM | POA: Diagnosis not present

## 2020-05-08 DIAGNOSIS — N951 Menopausal and female climacteric states: Secondary | ICD-10-CM | POA: Insufficient documentation

## 2020-05-08 DIAGNOSIS — R591 Generalized enlarged lymph nodes: Secondary | ICD-10-CM

## 2020-05-08 DIAGNOSIS — Z8042 Family history of malignant neoplasm of prostate: Secondary | ICD-10-CM | POA: Insufficient documentation

## 2020-05-08 DIAGNOSIS — R232 Flushing: Secondary | ICD-10-CM | POA: Diagnosis not present

## 2020-05-08 DIAGNOSIS — R590 Localized enlarged lymph nodes: Secondary | ICD-10-CM | POA: Diagnosis not present

## 2020-05-08 LAB — LACTATE DEHYDROGENASE: LDH: 167 U/L (ref 98–192)

## 2020-05-08 NOTE — Progress Notes (Signed)
[facility] [address], [place] [address]   CLINIC:  Medical Oncology/Hematology  CONSULT NOTE  Patient Care Team: Benita Stabile, MD as PCP - General (Internal Medicine) Dishmon, Burnett Kanaris, RN as Oncology Nurse Navigator (Oncology) Mickie Bail, RN as Oncology Nurse Navigator (Oncology) Doreatha Massed, MD as Consulting Physician (Hematology)  CHIEF COMPLAINTS/PURPOSE OF CONSULTATION:  Lymph nodes in the neck.  HISTORY OF PRESENTING ILLNESS:  Ms. Marisa Johnson 44 y.o. female is here because of recently imaged enlarged lymph nodes, at the request of Dr. Catalina Pizza.  She notes that she has had several instances of falls. She states that the first fall she had was a result of drinking, but her subsequent falls were due to an unknown cause. Her PCP removed her from several medications to see if it was a side affect from that.   She has an appointment with ENT on 05/23/2020.   She has noticed night sweats, but she believes that this was related to her placement on Megace, which she was placed on by Dr. Emelda Fear, as this started in 10/2019 after she had been placed on Megace for her heavy menstruation. She complains of breakthrough headaches.    MEDICAL HISTORY:  Past Medical History:  Diagnosis Date  . ADHD   . Anxiety   . Carpal tunnel syndrome   . Connective tissue disorder (HCC)   . Contraceptive management 08/09/2015  . Decreased libido 08/09/2015  . Friable cervix 08/09/2015  . History of abnormal cervical Pap smear 08/09/2015  . Hypothyroidism   . IBS (irritable bowel syndrome)   . Irregular intermenstrual bleeding 08/09/2015  . Major depressive disorder   . Migraine   . Mixed connective tissue disease (HCC)   . Pseudobulbar affect   . PTSD (post-traumatic stress disorder)   . Sjogren's syndrome with lung involvement (HCC)   . Thyroid disease   . Vaginal Pap smear, abnormal     SURGICAL HISTORY: Past Surgical History:  Procedure  Laterality Date  . ABLATION    . DILATION AND CURETTAGE OF UTERUS N/A 07/25/2017   Procedure: SUCTION DILATATION AND CURETTAGE;  Surgeon: Tilda Burrow, MD;  Location: [facility];  Service: Gynecology;  Laterality: N/A;  . DILITATION & CURRETTAGE/HYSTROSCOPY WITH NOVASURE ABLATION N/A 09/30/2017   Procedure: DILATATION, HYSTEROSCOPY WITH NOVASURE ENDOMETRIAL ABLATION;  Surgeon: Tilda Burrow, MD;  Location: [facility];  Service: Gynecology;  Laterality: N/A;  . ESOPHAGOGASTRODUODENOSCOPY N/A 11/17/2016   Procedure: ESOPHAGOGASTRODUODENOSCOPY (EGD);  Surgeon: Malissa Hippo, MD;  Location: [facility];  Service: Endoscopy;  Laterality: N/A;  . LAPAROSCOPIC BILATERAL SALPINGECTOMY Bilateral 09/30/2017   Procedure: LAPAROSCOPIC BILATERAL SALPINGECTOMY;  Surgeon: Tilda Burrow, MD;  Location: [facility];  Service: Gynecology;  Laterality: Bilateral;  . None      SOCIAL HISTORY: Social History   Socioeconomic History  . Marital status: Legally Separated    Spouse name: Not on file  . Number of children: 3  . Years of education: Not on file  . Highest education level: Not on file  Occupational History  . Occupation: Copywriter, advertising: Advertising copywriter    Comment: ED, 12 years  Tobacco Use  . Smoking status: Never Smoker  . Smokeless tobacco: Never Used  . Tobacco comment: Never smoker  Vaping Use  . Vaping Use: Never used  Substance and Sexual Activity  . Alcohol use: Yes    Comment: occ.  . Drug use: No  .  Sexual activity: Yes    Birth control/protection: Surgical    Comment: tubal and ablation  Other Topics Concern  . Not on file  Social History Narrative  . Not on file   Social Determinants of Health   Financial Resource Strain:   . Difficulty of Paying Living Expenses:   Food Insecurity:   . Worried About Programme researcher, broadcasting/film/video in the Last Year:   . Barista in the Last Year:   Transportation Needs:   . Freight forwarder (Medical):   Marland Kitchen Lack of  Transportation (Non-Medical):   Physical Activity:   . Days of Exercise per Week:   . Minutes of Exercise per Session:   Stress:   . Feeling of Stress :   Social Connections:   . Frequency of Communication with Friends and Family:   . Frequency of Social Gatherings with Friends and Family:   . Attends Religious Services:   . Active Member of Clubs or Organizations:   . Attends Banker Meetings:   Marland Kitchen Marital Status:   Intimate Partner Violence:   . Fear of Current or Ex-Partner:   . Emotionally Abused:   Marland Kitchen Physically Abused:   . 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    FAMILY HISTORY: Family History  Problem Relation Age of Onset  . Diabetes Mother   . Hypertension Mother   . Hypothyroidism Mother   . Hypertension Father   . Stroke Father   . Cancer Father        prostate, with metastisis  . Cancer Maternal Grandmother        Gaulbladder  . Kidney disease Paternal Grandmother   . Hypertension Paternal Grandmother   . Hyperlipidemia Paternal Grandmother   . Heart disease Paternal Grandmother   . Heart attack Paternal Grandmother   . Diabetes Daughter   . Hypothyroidism Daughter   . Raynaud syndrome Daughter   . Rheumatologic disease Paternal Grandfather   . Anxiety disorder Sister   . Alcoholism Sister   . Anxiety disorder Brother   . Drug abuse Brother   . Anxiety disorder Sister   . Cancer Maternal Uncle        Lung  . Colon cancer Neg Hx     ALLERGIES:  is allergic to abilify [aripiprazole], other, reglan [metoclopramide], and dexamethasone.  MEDICATIONS:  Current Outpatient Medications  Medication Sig Dispense Refill  . ADDERALL XR 30 MG 24 hr capsule Take 30 mg by mouth daily.    Marland Kitchen AIMOVIG 70 MG/ML SOAJ Inject 1 Syringe into the skin every 30 (thirty) days.    . Brexpiprazole (REXULTI) 0.5 MG TABS Take 1 tablet by mouth daily.      Marland Kitchen buPROPion (WELLBUTRIN XL) 150 MG 24 hr tablet Take 150 mg by mouth every evening.     . clonazePAM (KLONOPIN) 0.5 MG tablet Take 0.5 mg by mouth daily.     . cyclobenzaprine (FLEXERIL) 10 MG tablet Take 10 mg by mouth 3 (three) times daily. (Patient not taking: Reported on 05/05/2020)    . lamoTRIgine (LAMICTAL) 200 MG tablet Take 300 mg by mouth at bedtime. Takes 1.5 tablet    . levocetirizine (XYZAL) 5 MG tablet Take 5 mg by mouth at bedtime.    Marland Kitchen levothyroxine (SYNTHROID) 137 MCG tablet levothyroxine 137 mcg tablet  TAKE 1 TABLET BY MOUTH  EVERY MORNING 30 MINS BEFORE MEALS    . megestrol (MEGACE) 40 MG tablet Take 1 tablet (40 mg total) by mouth daily. Beginning one week after completing provera 45 tablet 2  . promethazine (PHENERGAN) 25 MG tablet Take 25 mg by mouth every 6 (six) hours as needed. (Patient not taking: Reported on 05/05/2020)    . valACYclovir (VALTREX) 1000 MG tablet Take 1,000 mg by mouth daily. (Patient not taking: Reported on 05/05/2020)    . zolpidem (AMBIEN) 10 MG tablet Take 10 mg by mouth daily.     No current facility-administered medications for this visit.    REVIEW OF SYSTEMS:   Review of Systems  Constitutional: Positive for diaphoresis (night, occasional).  HENT:  Negative.   Eyes: Negative.   Respiratory: Positive for cough (allergies) and shortness of breath (allergies).   Cardiovascular: Negative.   Gastrointestinal: Positive for vomiting.  Endocrine: Negative.   Genitourinary: Negative.    Musculoskeletal: Negative.   Skin: Negative.   Neurological: Positive for headaches and numbness (R side of face).  Hematological: Negative.   Psychiatric/Behavioral: Negative.   All other systems reviewed and are negative.    PHYSICAL EXAMINATION: ECOG PERFORMANCE STATUS: 0 - Asymptomatic  Vitals:   05/08/20 0818  BP: 124/85  Pulse: 70  Resp: 18  Temp: 98.2 F (36.8 C)  SpO2: 97%   Filed Weights   05/08/20 0818  Weight: (!) 203 lb (92.1 kg)    Physical Exam Vitals and nursing note reviewed.  Constitutional:      Appearance: Normal appearance.  HENT:     Mouth/Throat:     Mouth: Mucous membranes are moist.  Eyes:     Pupils: Pupils are equal, round, and reactive to light.  Cardiovascular:     Rate and Rhythm: Normal rate and regular rhythm.     Pulses: Normal pulses.     Heart sounds: Normal heart sounds.  Pulmonary:     Effort: Pulmonary effort is normal.     Breath sounds: Normal breath sounds.  Abdominal:     Palpations: Abdomen is soft. There is no mass.     Tenderness: There is abdominal tenderness in the left lower quadrant.  Musculoskeletal:        General: No tenderness.  Lymphadenopathy:     Cervical:     Right cervical: No superficial, deep or posterior cervical adenopathy.    Left cervical: No superficial, deep or posterior cervical adenopathy.     Upper Body:     Right upper body: No supraclavicular, axillary, pectoral or epitrochlear adenopathy.     Left upper body: No supraclavicular, axillary, pectoral or epitrochlear adenopathy.  Neurological:     Mental Status: She is alert.  Psychiatric:        Mood and Affect: Mood normal.        Behavior: Behavior normal.      LABORATORY DATA:  I have reviewed the data as listed CBC Latest Ref Rng & Units 03/31/2020 03/31/2020 10/06/2019  WBC 4.0 - 10.5 K/uL 8.1 - -  Hemoglobin 12.0 - 15.0 g/dL 16.114.5 09.615.0 04.511.6  Hematocrit 36 - 46 % 43.6 44.0 -  Platelets 150 - 400 K/uL 348 - -   CMP Latest Ref Rng & Units 03/31/2020 03/31/2020 03/31/2018  Glucose 70 - 99 mg/dL 91 409(W100(H) 91  BUN 6 - 20 mg/dL 12 12 9   Creatinine 0.44 - 1.00 mg/dL 1.19(J1.18(H) 4.78(G1.20(H) 9.560.84  Sodium 135 - 145 mmol/L 140 144 137  Potassium 3.5 - 5.1  mmol/L 2.8(L) 2.9(L) 3.1(L)  Chloride 98 - 111 mmol/L 100 104 103  CO2 22 - 32 mmol/L 24 - 27  Calcium 8.9 - 10.3 mg/dL 9.7 - 8.3(L)  Total Protein 6.5 - 8.1 g/dL 8.0 - 6.6  Total Bilirubin 0.3 - 1.2 mg/dL 0.8 - 0.5  Alkaline Phos 38 - 126 U/L 84 -  66  AST 15 - 41 U/L 28 - 18  ALT 0 - 44 U/L 29 - 14    RADIOGRAPHIC STUDIES: I have personally reviewed the radiological images as listed and agreed with the findings in the report. MR BRAIN WO CONTRAST  Result Date: 05/06/2020 CLINICAL DATA:  Migraines with neck pain and right-sided numbness for 2 months EXAM: MRI HEAD WITHOUT CONTRAST TECHNIQUE: Multiplanar, multiecho pulse sequences of the brain and surrounding structures were obtained without intravenous contrast. COMPARISON:  07/10/2016 FINDINGS: BRAIN: No acute infarct, acute hemorrhage or extra-axial collection. Normal white matter signal. Normal volume of CSF spaces. No chronic microhemorrhage. Normal midline structures. VASCULAR: Major flow voids are preserved. SKULL AND UPPER CERVICAL SPINE: Normal calvarium and skull base. Visualized upper cervical spine and soft tissues are normal. SINUSES/ORBITS: No paranasal sinus fluid levels or advanced mucosal thickening. No mastoid or middle ear effusion. Normal orbits. IMPRESSION: Normal brain MRI. Electronically Signed   By: Deatra Robinson M.D.   On: 05/06/2020 03:30   MR CERVICAL SPINE WO CONTRAST  Result Date: 05/06/2020 CLINICAL DATA:  Migraine headaches, neck pain and right-sided numbness for 2 months. No known injury. EXAM: MRI CERVICAL SPINE WITHOUT CONTRAST TECHNIQUE: Multiplanar, multisequence MR imaging of the cervical spine was performed. No intravenous contrast was administered. COMPARISON:  Cervical spine CT 03/11/2020.  MRI 07/10/2016. FINDINGS: Alignment: Normal. Vertebrae: No acute or suspicious osseous findings. Stable hemangioma within the C6 vertebral body. Cord: Normal in signal and caliber. Posterior Fossa, vertebral arteries, paraspinal tissues: Visualized portions of the posterior fossa and paraspinal soft tissues appear unremarkable. Empty sella turcica and mild mucosal thickening in the left division of the sphenoid sinus are noted. Bilateral vertebral artery flow voids. Disc  levels: No significant disc space findings at or above C3-4. C4-5: Stable mild disc bulging and uncinate spurring. No cord deformity or foraminal narrowing. C5-6: Stable mild disc bulging and uncinate spurring asymmetric to the right. No cord deformity or significant foraminal narrowing. C6-7: Stable shallow central disc protrusion without cord deformity. No significant foraminal narrowing. C7-T1: Mild facet hypertrophy. No spinal stenosis or nerve root encroachment. IMPRESSION: 1. Stable cervical MRI compared with previous examination from 2017. No acute findings or explanation for the patient's symptoms. 2. Stable mild spondylosis. No cord deformity or nerve root encroachment. Electronically Signed   By: Carey Bullocks M.D.   On: 05/06/2020 10:28    ASSESSMENT:  1.  Cervical lymphadenopathy: -CT angio of the neck on 03/31/2020 showed incidental findings of right and left level 2 lymph nodes measuring 14 mm and multiple posterior lymph nodes measuring up to 10 mm. -She denies any fevers, night sweats or weight loss. -She has hot flashes since January after the start of Megace for bleeding.  2.  Family history: -Father had metastatic prostate cancer.  Maternal grandmother had gallbladder cancer and maternal uncle had lung cancer.   PLAN:  1.  Cervical lymphadenopathy: -We reviewed results of the CT scan. -Physical examination today did not reveal any significant palpable adenopathy.  No scalp lesions. -No other lymphadenopathy palpable in the axillary or inguinal region.  No palpable splenomegaly.  No B symptoms. -Lymphoproliferative  process less likely.  Will check LDH level today.  No further testing needed. -We will see her back in 2 months for follow-up of lymphadenopathy.   All questions were answered. The patient knows to call the clinic with any problems, questions or concerns.    Doreatha Massed, MD, 05/08/20 5:57 PM  [facility] [phone]   I, Mal Misty, am acting as a scribe for Dr. Payton Mccallum.  I, Doreatha Massed MD, have reviewed the above documentation for accuracy and completeness, and I agree with the above.

## 2020-05-08 NOTE — Patient Instructions (Signed)
[facility] at [facility] Discharge Instructions  You were seen today by Dr. Ellin Saba. He went over your recent results. Dr. Ellin Saba will see you back in for labs and follow up in 2 months.   Thank you for choosing [facility] at [facility] to provide your oncology and hematology care.  To afford each patient quality time with our provider, please arrive at least 15 minutes before your scheduled appointment time.   If you have a lab appointment with the Cancer Center please come in thru the Main Entrance and check in at the main information desk  You need to re-schedule your appointment should you arrive 10 or more minutes late.  We strive to give you quality time with our providers, and arriving late affects you and other patients whose appointments are after yours.  Also, if you no show three or more times for appointments you may be dismissed from the clinic at the providers discretion.     Again, thank you for choosing [facility].  Our hope is that these requests will decrease the amount of time that you wait before being seen by our physicians.       _____________________________________________________________  Should you have questions after your visit to [facility], please contact our office at [phone] between the hours of 8:00 a.m. and 4:30 p.m.  Voicemails left after 4:00 p.m. will not be returned until the following business day.  For prescription refill requests, have your pharmacy contact our office and allow 72 hours.    Cancer Center Support Programs:   > Cancer Support Group  2nd Tuesday of the month 1pm-2pm, Journey Room

## 2020-05-08 NOTE — Progress Notes (Signed)
I met with the patient during her visit with Dr. Delton Coombes today. Provided my contact information. Patient does not need anything from a navigator standpoint at this time, but was encouraged to contact me should anything arise.

## 2020-05-12 ENCOUNTER — Other Ambulatory Visit: Payer: Self-pay

## 2020-05-12 ENCOUNTER — Ambulatory Visit (HOSPITAL_COMMUNITY)
Admission: RE | Admit: 2020-05-12 | Discharge: 2020-05-12 | Disposition: A | Discharge status: Home / Self Care | Payer: 59 | Source: Ambulatory Visit | Attending: Internal Medicine | Admitting: Internal Medicine

## 2020-05-12 DIAGNOSIS — E039 Hypothyroidism, unspecified: Secondary | ICD-10-CM

## 2020-05-17 ENCOUNTER — Ambulatory Visit (HOSPITAL_COMMUNITY): Payer: 59

## 2020-05-23 ENCOUNTER — Encounter (INDEPENDENT_AMBULATORY_CARE_PROVIDER_SITE_OTHER): Payer: Self-pay | Admitting: Otolaryngology

## 2020-05-23 ENCOUNTER — Other Ambulatory Visit: Payer: Self-pay

## 2020-05-23 ENCOUNTER — Ambulatory Visit (INDEPENDENT_AMBULATORY_CARE_PROVIDER_SITE_OTHER): Payer: 59 | Admitting: Otolaryngology

## 2020-05-23 VITALS — Temp 97.3°F

## 2020-05-23 DIAGNOSIS — J323 Chronic sphenoidal sinusitis: Secondary | ICD-10-CM | POA: Diagnosis not present

## 2020-05-23 NOTE — Progress Notes (Signed)
HPI: Marisa Johnson is a 44 y.o. female who presents is referred by her PCP for evaluation of chronic left sphenoid sinus disease.  She has had a long history of numerous head MRI scans and CT scans.  Her initial MRI scan performed in 2015 showed clear paranasal sinuses and a clear left sphenoid sinus.  However a subsequent MRI scan performed in 2017 demonstrated left sphenoid sinus disease.  She is also had history of migraine headaches.  She also had a CTA scan of her head performed in June of this year because of headache and pain behind her right with some vision changes to rule out stroke or vascular problems.  This was negative for any vascular problems but did demonstrate left sphenoid sinus disease.  She was treated with antibiotics.  She had another MRI scan of her brain performed 05/04/2020 because of persistent headaches and on review of this she continues to have left sphenoid sinus disease with mucoperiosteal thickening and partial obstruction.  The remaining sinuses were clear.  Also noted on the CT scan MRI scan is a septal deviation to the left.  Frontal sinuses have remained clear throughout.  She has mild the nasal congestion but no gross mucopurulent discharge.  She has had no fever.  She has used Flonase in the past but presently not using any nasal sprays.  Past Medical History:  Diagnosis Date  . ADHD   . Anxiety   . Carpal tunnel syndrome   . Connective tissue disorder (HCC)   . Contraceptive management 08/09/2015  . Decreased libido 08/09/2015  . Friable cervix 08/09/2015  . History of abnormal cervical Pap smear 08/09/2015  . Hypothyroidism   . IBS (irritable bowel syndrome)   . Irregular intermenstrual bleeding 08/09/2015  . Major depressive disorder   . Migraine   . Mixed connective tissue disease (HCC)   . Pseudobulbar affect   . PTSD (post-traumatic stress disorder)   . Sjogren's syndrome with lung involvement (HCC)   . Thyroid disease   . Vaginal Pap smear, abnormal     Past Surgical History:  Procedure Laterality Date  . ABLATION    . DILATION AND CURETTAGE OF UTERUS N/A 07/25/2017   Procedure: SUCTION DILATATION AND CURETTAGE;  Surgeon: Tilda Burrow, MD;  Location: [facility];  Service: Gynecology;  Laterality: N/A;  . DILITATION & CURRETTAGE/HYSTROSCOPY WITH NOVASURE ABLATION N/A 09/30/2017   Procedure: DILATATION, HYSTEROSCOPY WITH NOVASURE ENDOMETRIAL ABLATION;  Surgeon: Tilda Burrow, MD;  Location: [facility];  Service: Gynecology;  Laterality: N/A;  . ESOPHAGOGASTRODUODENOSCOPY N/A 11/17/2016   Procedure: ESOPHAGOGASTRODUODENOSCOPY (EGD);  Surgeon: Malissa Hippo, MD;  Location: [facility];  Service: Endoscopy;  Laterality: N/A;  . LAPAROSCOPIC BILATERAL SALPINGECTOMY Bilateral 09/30/2017   Procedure: LAPAROSCOPIC BILATERAL SALPINGECTOMY;  Surgeon: Tilda Burrow, MD;  Location: [facility];  Service: Gynecology;  Laterality: Bilateral;  . None     Social History   Socioeconomic History  . Marital status: Legally Separated    Spouse name: Not on file  . Number of children: 3  . Years of education: Not on file  . Highest education level: Not on file  Occupational History  . Occupation: Copywriter, advertising: Advertising copywriter    Comment: ED, 12 years  Tobacco Use  . Smoking status: Never Smoker  . Smokeless tobacco: Never Used  . Tobacco comment: Never smoker  Vaping Use  . Vaping Use: Never used  Substance and Sexual Activity  . Alcohol use: Yes  Comment: occ.  . Drug use: No  . Sexual activity: Yes    Birth control/protection: Surgical    Comment: tubal and ablation  Other Topics Concern  . Not on file  Social History Narrative  . Not on file   Social Determinants of Health   Financial Resource Strain:   . Difficulty of Paying Living Expenses:   Food Insecurity:   . Worried About Programme researcher, broadcasting/film/video in the Last Year:   . Barista in the Last Year:   Transportation Needs:   . Freight forwarder  (Medical):   Marland Kitchen Lack of Transportation (Non-Medical):   Physical Activity:   . Days of Exercise per Week:   . Minutes of Exercise per Session:   Stress:   . Feeling of Stress :   Social Connections:   . Frequency of Communication with Friends and Family:   . Frequency of Social Gatherings with Friends and Family:   . Attends Religious Services:   . Active Member of Clubs or Organizations:   . Attends Banker Meetings:   Marland Kitchen Marital Status:    Family History  Problem Relation Age of Onset  . Diabetes Mother   . Hypertension Mother   . Hypothyroidism Mother   . Hypertension Father   . Stroke Father   . Cancer Father        prostate, with metastisis  . Cancer Maternal Grandmother        Gaulbladder  . Kidney disease Paternal Grandmother   . Hypertension Paternal Grandmother   . Hyperlipidemia Paternal Grandmother   . Heart disease Paternal Grandmother   . Heart attack Paternal Grandmother   . Diabetes Daughter   . Hypothyroidism Daughter   . Raynaud syndrome Daughter   . Rheumatologic disease Paternal Grandfather   . Anxiety disorder Sister   . Alcoholism Sister   . Anxiety disorder Brother   . Drug abuse Brother   . Anxiety disorder Sister   . Cancer Maternal Uncle        Lung  . Colon cancer Neg Hx    Allergies  Allergen Reactions  . Abilify [Aripiprazole] Other (See Comments)    Reaction:  Restless leg syndrome   . Other     Bean Sprouts  . Reglan [Metoclopramide] Other (See Comments)    Reaction:  Hallucinations   . Dexamethasone Rash   Prior to Admission medications   Medication Sig Start Date End Date Taking? Authorizing Provider  ADDERALL XR 30 MG 24 hr capsule Take 30 mg by mouth daily. 09/22/19  Yes [provider]  AIMOVIG 70 MG/ML SOAJ Inject 1 Syringe into the skin every 30 (thirty) days. 08/16/19  Yes [provider]  Brexpiprazole (REXULTI) 0.5 MG TABS Take 1 tablet by mouth daily.    Yes [provider]   buPROPion (WELLBUTRIN XL) 150 MG 24 hr tablet Take 150 mg by mouth every evening.    Yes [provider]  clonazePAM (KLONOPIN) 0.5 MG tablet Take 0.5 mg by mouth daily.    Yes [provider]  cyclobenzaprine (FLEXERIL) 10 MG tablet Take 10 mg by mouth 3 (three) times daily.  05/03/20  Yes [provider]  lamoTRIgine (LAMICTAL) 200 MG tablet Take 300 mg by mouth at bedtime. Takes 1.5 tablet   Yes [provider]  levocetirizine (XYZAL) 5 MG tablet Take 5 mg by mouth at bedtime. 09/13/19  Yes [provider]  levothyroxine (SYNTHROID) 137 MCG tablet levothyroxine 137  mcg tablet  TAKE 1 TABLET BY MOUTH EVERY MORNING 30 MINS BEFORE MEALS   Yes [provider]  megestrol (MEGACE) 40 MG tablet Take 1 tablet (40 mg total) by mouth daily. Beginning one week after completing provera 04/01/20  Yes Tilda Burrow, MD  promethazine (PHENERGAN) 25 MG tablet Take 25 mg by mouth every 6 (six) hours as needed.  03/25/20  Yes [provider]  valACYclovir (VALTREX) 1000 MG tablet Take 1,000 mg by mouth daily.  05/04/20  Yes [provider]  zolpidem (AMBIEN) 10 MG tablet Take 10 mg by mouth daily. 03/14/20  Yes [provider]     Positive ROS: Otherwise negative  All other systems have been reviewed and were otherwise negative with the exception of those mentioned in the HPI and as above.  Physical Exam: Constitutional: Alert, well-appearing, no acute distress Ears: External ears without lesions or tenderness. Ear canals are clear bilaterally with intact, clear TMs.  Nasal: External nose without lesions. Septum is deviated to the left with a posterior left septal spur.  Both middle meatus regions were clear with no clinical evidence of acute infection or mucopurulent discharge.. Oral: Lips and gums without lesions. Tongue and palate mucosa without lesions. Posterior oropharynx clear. Neck: No palpable adenopathy or  masses Respiratory: Breathing comfortably  Skin: No facial/neck lesions or rash noted.  Procedures  Assessment: Chronic left sphenoid sinus disease dating back to 2017.  Plan: Placed her on Augmentin 875 mg twice daily for 3 weeks.  Sterapred 10 mg 6-day Dosepak and recommended nightly use of Nasacort 2 sprays each nostril for the next month.  We will repeat CT scan of the sinuses in 5 to 6 weeks to see if the sphenoid sinus clears or not.  If this persist may need surgical intervention.  She will follow-up following the CT scan and 5 to 6 weeks.   Narda Bonds, MD   CC:

## 2020-05-24 ENCOUNTER — Other Ambulatory Visit (INDEPENDENT_AMBULATORY_CARE_PROVIDER_SITE_OTHER): Payer: Self-pay

## 2020-05-24 DIAGNOSIS — J323 Chronic sphenoidal sinusitis: Secondary | ICD-10-CM

## 2020-06-07 ENCOUNTER — Other Ambulatory Visit: Payer: 59 | Admitting: Obstetrics and Gynecology

## 2020-06-27 ENCOUNTER — Ambulatory Visit (HOSPITAL_COMMUNITY)
Admission: RE | Admit: 2020-06-27 | Discharge: 2020-06-27 | Disposition: A | Discharge status: Home / Self Care | Payer: 59 | Source: Ambulatory Visit | Attending: Otolaryngology | Admitting: Otolaryngology

## 2020-06-27 ENCOUNTER — Ambulatory Visit (INDEPENDENT_AMBULATORY_CARE_PROVIDER_SITE_OTHER): Payer: 59 | Admitting: Adult Health

## 2020-06-27 ENCOUNTER — Other Ambulatory Visit (HOSPITAL_COMMUNITY)
Admission: RE | Admit: 2020-06-27 | Discharge: 2020-06-27 | Disposition: A | Discharge status: Home / Self Care | Payer: 59 | Source: Ambulatory Visit | Attending: Obstetrics and Gynecology | Admitting: Obstetrics and Gynecology

## 2020-06-27 ENCOUNTER — Encounter: Payer: Self-pay | Admitting: Adult Health

## 2020-06-27 ENCOUNTER — Other Ambulatory Visit: Payer: Self-pay

## 2020-06-27 VITALS — BP 132/90 | HR 87 | Ht 63.0 in | Wt 201.0 lb

## 2020-06-27 DIAGNOSIS — J323 Chronic sphenoidal sinusitis: Secondary | ICD-10-CM | POA: Diagnosis not present

## 2020-06-27 DIAGNOSIS — Z01419 Encounter for gynecological examination (general) (routine) without abnormal findings: Secondary | ICD-10-CM | POA: Insufficient documentation

## 2020-06-27 DIAGNOSIS — N921 Excessive and frequent menstruation with irregular cycle: Secondary | ICD-10-CM | POA: Diagnosis not present

## 2020-06-27 DIAGNOSIS — Z1211 Encounter for screening for malignant neoplasm of colon: Secondary | ICD-10-CM | POA: Insufficient documentation

## 2020-06-27 LAB — HEMOCCULT GUIAC POC 1CARD (OFFICE): Fecal Occult Blood, POC: NEGATIVE

## 2020-06-27 MED ORDER — MEGESTROL ACETATE 40 MG PO TABS: 40.0000 mg | ORAL_TABLET | Freq: Every day | ORAL | 12 refills | Status: DC

## 2020-06-27 NOTE — Progress Notes (Signed)
Patient ID: Marisa Johnson, female   DOB: 12-01-1975, 44 y.o.   MRN: [id number] History of Present Illness: Marisa Johnson is a 44 year old white female,separated, [id number], in for well woman gyn exam and pap. PCP is Dr Margo Aye.   Current Medications, Allergies, Past Medical History, Past Surgical History, Family History and Social History were reviewed in [employer] record.     Review of Systems: Patient denies any headaches, hearing loss, fatigue, blurred vision, shortness of breath, chest pain, abdominal pain, problems with bowel movements, urination, or intercourse. No joint pain or mood swings. Has had some itching, in vulva area, but has been more sexually active Has low back pain at times Has sinus issues getting CT today  Has bleeding if not on megace 1 daily, she had failed ablation     Physical Exam:BP 132/90 (BP Location: Left Arm, Patient Position: Sitting, Cuff Size: Normal)   Pulse 87   Ht 5\' 3"  (1.6 m)   Wt 201 lb (91.2 kg)   BMI 35.61 kg/m  General:  Well developed, well nourished, no acute distress Skin:  Warm and dry,tan Neck:  Midline trachea, normal thyroid, good ROM, no lymphadenopathy Lungs; Clear to auscultation bilaterally Breast:  No dominant palpable mass, retraction, or nipple discharge Cardiovascular: Regular rate and rhythm Abdomen:  Soft, non tender, no hepatosplenomegaly Pelvic:  External genitalia is normal in appearance, has several angiokeratomas.  The vagina is normal in appearance. Urethra has no lesions or masses. The cervix is bulbous, pap with GC/CHL and high risk HPV genotyping performed.  Uterus is felt to be normal size, shape, and contour.  No adnexal masses or tenderness noted.Bladder is non tender, no masses felt. Rectal: Good sphincter tone, no polyps, or hemorrhoids felt.  Hemoccult negative. Extremities/musculoskeletal:  No swelling or varicosities noted, no clubbing or cyanosis Psych:  No mood changes, alert and  cooperative,seems happy AA is 4 Fall risk is high PHQ 9 score is 0  Upstream - 06/27/20 1556      Pregnancy Intention Screening   Does the patient want to become pregnant in the next year? No    Does the patient's partner want to become pregnant in the next year? No    Would the patient like to discuss contraceptive options today? No      Contraception Wrap Up   Current Method Female Sterilization    End Method Female Sterilization    Contraception Counseling Provided No         Examination chaperoned by 06/29/20 LPN  Impression and Plan: 1. Encounter for gynecological examination with Papanicolaou smear of cervix Pap sent Physical in 1 year Pap in 3 if normal Mammogram yearly Labs with PCP Colonoscopy or cologuard at 45,advised   2. Encounter for screening fecal occult blood testing  3. Irregular intermenstrual bleeding Will refill megace Meds ordered this encounter  Medications  . megestrol (MEGACE) 40 MG tablet    Sig: Take 1 tablet (40 mg total) by mouth daily. Take 1 daily    Dispense:  30 tablet    Refill:  12    Order Specific Question:   Supervising Provider    Answer:   Nance Pear H [[id number]]

## 2020-06-28 ENCOUNTER — Ambulatory Visit (INDEPENDENT_AMBULATORY_CARE_PROVIDER_SITE_OTHER): Payer: 59 | Admitting: Otolaryngology

## 2020-06-28 ENCOUNTER — Encounter (INDEPENDENT_AMBULATORY_CARE_PROVIDER_SITE_OTHER): Payer: Self-pay | Admitting: Otolaryngology

## 2020-06-28 VITALS — Temp 97.0°F

## 2020-06-28 DIAGNOSIS — J310 Chronic rhinitis: Secondary | ICD-10-CM | POA: Diagnosis not present

## 2020-06-28 DIAGNOSIS — J323 Chronic sphenoidal sinusitis: Secondary | ICD-10-CM

## 2020-06-28 DIAGNOSIS — J342 Deviated nasal septum: Secondary | ICD-10-CM

## 2020-06-28 NOTE — Progress Notes (Signed)
HPI: Marisa Johnson is a 44 y.o. female who returns today for evaluation of chronic nasal sinus symptoms.  She has had a previous MRI scan that showed left sphenoid sinus disease.  She has recently undergone treatment with Augmentin and prednisone for couple weeks and had repeat CT scan performed yesterday of her sinuses.  She still complains mostly of congestion in her nose.  I reviewed the CT scan with the patient in the office today I reviewed the CT scan with the patient and this showed clear paranasal sinuses except for some mild mucoperiosteal thickening within the left sphenoid sinus but no air-fluid level and no bony changes.  She does have a septal spur to the left. Today she is complaining of nasal congestion which is more on the left side..  Past Medical History:  Diagnosis Date  . ADHD   . Anxiety   . Carpal tunnel syndrome   . Connective tissue disorder (HCC)   . Contraceptive management 08/09/2015  . Decreased libido 08/09/2015  . Friable cervix 08/09/2015  . History of abnormal cervical Pap smear 08/09/2015  . Hypothyroidism   . IBS (irritable bowel syndrome)   . Irregular intermenstrual bleeding 08/09/2015  . Major depressive disorder   . Migraine   . Mixed connective tissue disease (HCC)   . Pseudobulbar affect   . PTSD (post-traumatic stress disorder)   . Sjogren's syndrome with lung involvement (HCC)   . Thyroid disease   . Vaginal Pap smear, abnormal    Past Surgical History:  Procedure Laterality Date  . ABLATION    . DILATION AND CURETTAGE OF UTERUS N/A 07/25/2017   Procedure: SUCTION DILATATION AND CURETTAGE;  Surgeon: Tilda Burrow, MD;  Location: [facility];  Service: Gynecology;  Laterality: N/A;  . DILITATION & CURRETTAGE/HYSTROSCOPY WITH NOVASURE ABLATION N/A 09/30/2017   Procedure: DILATATION, HYSTEROSCOPY WITH NOVASURE ENDOMETRIAL ABLATION;  Surgeon: Tilda Burrow, MD;  Location: [facility];  Service: Gynecology;  Laterality: N/A;  .  ESOPHAGOGASTRODUODENOSCOPY N/A 11/17/2016   Procedure: ESOPHAGOGASTRODUODENOSCOPY (EGD);  Surgeon: Malissa Hippo, MD;  Location: [facility];  Service: Endoscopy;  Laterality: N/A;  . LAPAROSCOPIC BILATERAL SALPINGECTOMY Bilateral 09/30/2017   Procedure: LAPAROSCOPIC BILATERAL SALPINGECTOMY;  Surgeon: Tilda Burrow, MD;  Location: [facility];  Service: Gynecology;  Laterality: Bilateral;  . None     Social History   Socioeconomic History  . Marital status: Legally Separated    Spouse name: Not on file  . Number of children: 3  . Years of education: Not on file  . Highest education level: Not on file  Occupational History  . Occupation: Copywriter, advertising: Advertising copywriter    Comment: ED, 12 years  Tobacco Use  . Smoking status: Never Smoker  . Smokeless tobacco: Never Used  . Tobacco comment: Never smoker  Vaping Use  . Vaping Use: Never used  Substance and Sexual Activity  . Alcohol use: Yes    Comment: occ.  . Drug use: No  . Sexual activity: Yes    Birth control/protection: Surgical    Comment: tubal and ablation  Other Topics Concern  . Not on file  Social History Narrative  . Not on file   Social Determinants of Health   Financial Resource Strain: Low Risk   . Difficulty of Paying Living Expenses: Not hard at all  Food Insecurity: No Food Insecurity  . Worried About Programme researcher, broadcasting/film/video in the Last Year: Never true  . Ran Out of  Food in the Last Year: Never true  Transportation Needs: No Transportation Needs  . Lack of Transportation (Medical): No  . Lack of Transportation (Non-Medical): No  Physical Activity: Inactive  . Days of Exercise per Week: 0 days  . Minutes of Exercise per Session: 0 min  Stress: No Stress Concern Present  . Feeling of Stress : Not at all  Social Connections: Socially Isolated  . Frequency of Communication with Friends and Family: More than three times a week  . Frequency of Social Gatherings with Friends and Family: More  than three times a week  . Attends Religious Services: Never  . Active Member of Clubs or Organizations: No  . Attends Banker Meetings: Never  . Marital Status: Separated   Family History  Problem Relation Age of Onset  . Diabetes Mother   . Hypertension Mother   . Hypothyroidism Mother   . Hypertension Father   . Stroke Father   . Cancer Father        prostate, with metastisis  . Cancer Maternal Grandmother        Gaulbladder  . Kidney disease Paternal Grandmother   . Hypertension Paternal Grandmother   . Hyperlipidemia Paternal Grandmother   . Heart disease Paternal Grandmother   . Heart attack Paternal Grandmother   . Diabetes Daughter   . Hypothyroidism Daughter   . Raynaud syndrome Daughter   . Rheumatologic disease Paternal Grandfather   . Anxiety disorder Sister   . Alcoholism Sister   . Anxiety disorder Brother   . Drug abuse Brother   . Anxiety disorder Sister   . Cancer Maternal Uncle        Lung  . Colon cancer Neg Hx    Allergies  Allergen Reactions  . Abilify [Aripiprazole] Other (See Comments)    Reaction:  Restless leg syndrome   . Other     Bean Sprouts  . Reglan [Metoclopramide] Other (See Comments)    Reaction:  Hallucinations   . Dexamethasone Rash   Prior to Admission medications   Medication Sig Start Date End Date Taking? Authorizing Provider  ADDERALL XR 30 MG 24 hr capsule Take 30 mg by mouth daily. 09/22/19  Yes [provider]  AIMOVIG 70 MG/ML SOAJ Inject 1 Syringe into the skin every 30 (thirty) days. 08/16/19  Yes [provider]  Brexpiprazole (REXULTI) 0.5 MG TABS Take 1 tablet by mouth daily.    Yes [provider]  buPROPion (WELLBUTRIN XL) 150 MG 24 hr tablet Take 150 mg by mouth every evening.    Yes [provider]  busPIRone (BUSPAR) 10 MG tablet Take by mouth. 05/30/20  Yes [provider]  lamoTRIgine (LAMICTAL) 200 MG tablet Take 300 mg by mouth at bedtime. Takes 1.5  tablet   Yes [provider]  levocetirizine (XYZAL) 5 MG tablet Take 5 mg by mouth at bedtime. 09/13/19  Yes [provider]  levothyroxine (SYNTHROID) 137 MCG tablet levothyroxine 137 mcg tablet  TAKE 1 TABLET BY MOUTH EVERY MORNING 30 MINS BEFORE MEALS   Yes [provider]  megestrol (MEGACE) 40 MG tablet Take 1 tablet (40 mg total) by mouth daily. Take 1 daily 06/27/20  Yes Cyril Mourning A, NP  promethazine (PHENERGAN) 25 MG tablet Take 25 mg by mouth every 6 (six) hours as needed.  03/25/20  Yes [provider]  valACYclovir (VALTREX) 1000 MG tablet Take 1,000 mg by mouth daily.  05/04/20  Yes [provider]  zolpidem (  AMBIEN) 10 MG tablet Take 10 mg by mouth daily. 03/14/20  Yes [provider]     Positive ROS: Otherwise negative  All other systems have been reviewed and were otherwise negative with the exception of those mentioned in the HPI and as above.  Physical Exam: Constitutional: Alert, well-appearing, no acute distress Ears: External ears without lesions or tenderness. Ear canals are clear bilaterally with intact, clear TMs.  Nasal: External nose without lesions. Septum slightly deviated to the right anteriorly and septal spur on the left side she has mild rhinitis which today is worse on the right side..  Both middle meatus regions are clear with no clinical evidence of acute sinus infection no mucopurulent discharge.  Mucus clear within the nasal cavity. Oral: Lips and gums without lesions. Tongue and palate mucosa without lesions. Posterior oropharynx clear. Neck: No palpable adenopathy or masses Respiratory: Breathing comfortably  Skin: No facial/neck lesions or rash noted.  Procedures  Assessment: Chronic rhinitis with slight septal deviation and left septal spur. Clinically no evidence of acute sinus infection with no mucopurulent discharge. Chronic mucosal thickening within the left sphenoid  sinus.  Plan: Discussed with her concerning regular use of nasal steroid spray which should help with the nasal congestion. I am not sure surgical intervention is warranted at this time but if she has chronic problems could help improve nasal breathing with septoplasty and turbinate reductions and would recommend left sphenoidotomy if surgery was performed. She will continue with nasal steroid spray at this point and will call us back if problems worsen.   Narda Bonds, MD

## 2020-06-30 LAB — CYTOLOGY - PAP
Chlamydia: NEGATIVE
Comment: NEGATIVE
Comment: NEGATIVE
Comment: NORMAL
Diagnosis: NEGATIVE
High risk HPV: NEGATIVE
Neisseria Gonorrhea: NEGATIVE

## 2020-07-19 ENCOUNTER — Inpatient Hospital Stay (HOSPITAL_COMMUNITY): Payer: 59 | Attending: Hematology | Admitting: Hematology

## 2020-07-27 ENCOUNTER — Other Ambulatory Visit: Payer: Self-pay

## 2020-07-27 ENCOUNTER — Encounter: Payer: Self-pay | Admitting: Orthopedic Surgery

## 2020-07-27 ENCOUNTER — Ambulatory Visit (INDEPENDENT_AMBULATORY_CARE_PROVIDER_SITE_OTHER): Payer: 59 | Admitting: Orthopedic Surgery

## 2020-07-27 VITALS — BP 173/118 | HR 103 | Ht 63.0 in | Wt 200.0 lb

## 2020-07-27 DIAGNOSIS — M659 Synovitis and tenosynovitis, unspecified: Secondary | ICD-10-CM | POA: Diagnosis not present

## 2020-07-27 NOTE — Progress Notes (Signed)
Chief Complaint  Patient presents with  . Wrist Pain    left since injury 11/27/19 states" sensory nerve compression "    44 year old female history of left wrist injury after casting and bracing had symptoms of de Quervain's syndrome and radial sensory nerve compression  Comes in with pain in the same area of the left wrist but this is more on the volar side  Pain radiates up the forearm with no history of trauma to the wrist since we have seen her  She did fracture her right talus  She is wearing a cam walker does not use crutches or walker  BP (!) 173/118   Pulse (!) 103   Ht 5\' 3"  (1.6 m)   Wt 200 lb (90.7 kg)   BMI 35.43 kg/m   Examination reveals negative de Quervain's normal sensation no pain or tenderness in the first extensor compartment but she is tender on the radial side of the wrist and the FCR tendon    Encounter Diagnosis  Name Primary?  . FCR (flexor carpi radialis) tenosynovitis Yes   SHOULD RESOLVE   FU AS NEEDED

## 2021-01-13 ENCOUNTER — Encounter (HOSPITAL_COMMUNITY): Payer: Self-pay | Admitting: Emergency Medicine

## 2021-01-13 ENCOUNTER — Emergency Department (HOSPITAL_COMMUNITY): Payer: 59

## 2021-01-13 ENCOUNTER — Emergency Department (HOSPITAL_COMMUNITY)
Admission: EM | Admit: 2021-01-13 | Discharge: 2021-01-13 | Disposition: A | Discharge status: Home / Self Care | Payer: 59 | Attending: Emergency Medicine | Admitting: Emergency Medicine

## 2021-01-13 ENCOUNTER — Other Ambulatory Visit: Payer: Self-pay

## 2021-01-13 DIAGNOSIS — X500XXA Overexertion from strenuous movement or load, initial encounter: Secondary | ICD-10-CM | POA: Insufficient documentation

## 2021-01-13 DIAGNOSIS — Z79899 Other long term (current) drug therapy: Secondary | ICD-10-CM | POA: Diagnosis not present

## 2021-01-13 DIAGNOSIS — M545 Low back pain, unspecified: Secondary | ICD-10-CM

## 2021-01-13 DIAGNOSIS — E039 Hypothyroidism, unspecified: Secondary | ICD-10-CM | POA: Diagnosis not present

## 2021-01-13 HISTORY — DX: Chronic fatigue, unspecified: R53.82

## 2021-01-13 LAB — POC URINE PREG, ED: Preg Test, Ur: NEGATIVE

## 2021-01-13 MED ORDER — DEXAMETHASONE SODIUM PHOSPHATE 10 MG/ML IJ SOLN
10.0000 mg | Freq: Once | INTRAMUSCULAR | Status: AC
  Administered 2021-01-13: 10 mg via INTRAMUSCULAR
  Filled 2021-01-13: qty 1

## 2021-01-13 MED ORDER — OXYCODONE-ACETAMINOPHEN 5-325 MG PO TABS: 1.0000 | ORAL_TABLET | Freq: Four times a day (QID) | ORAL | 0 refills | Status: DC | PRN

## 2021-01-13 MED ORDER — OXYCODONE-ACETAMINOPHEN 5-325 MG PO TABS
1.0000 | ORAL_TABLET | Freq: Once | ORAL | Status: AC
  Administered 2021-01-13: 1 via ORAL
  Filled 2021-01-13: qty 1

## 2021-01-13 NOTE — Discharge Instructions (Addendum)
Start taking the prednisone prescribed earlier tomorrow.   Continue taking your flexeril. Take oxycodone if needed for pain relief.   Do not drive within 4 hours of taking oxycodone as this will make you drowsy.  Avoid lifting,  Bending,  Twisting or any other activity that worsens your pain over the next week.  Apply an icepack to your lower back for 10-15 minutes every 2 hours for the next 2 days, you may alternate this with heat as discussed.  You should get rechecked if your symptoms are not better over the next 5 days or you develop increased pain, weakness in your leg(s) or loss of bladder or bowel function - these can be symptoms of a worsening condition.

## 2021-01-13 NOTE — ED Triage Notes (Signed)
Patient c/o lower back pain. Per patient started having muscle spasms in back on Wednesday after carrying heavy vacuum up stairs. Patient states still having spasms next day and bent over to open a box/package that came in and felt a pop. Per patient became difficult to stand back up and pain intensified. Denies any complications with BMs or urination. Patient using lidocaine patch, flexeril, ibuprofen, and tylenol with no relief. Patient had a E-visit with doctor today and was prescribed steroids but told she still needed to be evaluated at Urgent Care or ED.

## 2021-01-13 NOTE — ED Notes (Signed)
DC instructions reviewed with pt including new prescription and how to take it. Explained s/s to watch for in case she needs to return. Mother present to drive her home.

## 2021-01-14 NOTE — ED Provider Notes (Signed)
[facility] EMERGENCY DEPARTMENT Provider Note   CSN: [id number] Arrival date & time: 01/13/21  1136     History Chief Complaint  Patient presents with  . Back Pain    Marisa Johnson is a 45 y.o. female with history as outlined below presenting for evaluation of low back pain.  She reports occasional problems with mild low back discomfort but has had significant escalation in pain across her mid lumbar region over the past 3 days.  She describes a muscle spasm type discomfort after carrying a heavy vacuum cleaner up a flight of steps.  She describes tight crampy pain that was not responding to flexeril, ibuprofen and tylenol.  The next day she simply bent over to open a box and felt a popping sensation, now with worsened persistent mid lower back pain without radiation.  She denies pain or weakness in her legs, also no urinary or fecal incontinence or retention.  She has also tried lidoderm patch without improvement.  Had an e visit with pcp today who prescribed prednisone but requested her come here for imaging.  Pain is worsened with positional changes/movement.  She has found no alleviators for her pain.  The history is provided by the patient.       Past Medical History:  Diagnosis Date  . ADHD   . Anxiety   . Carpal tunnel syndrome   . Chronic fatigue   . Connective tissue disorder (HCC)   . Contraceptive management 08/09/2015  . Decreased libido 08/09/2015  . Friable cervix 08/09/2015  . History of abnormal cervical Pap smear 08/09/2015  . Hypothyroidism   . IBS (irritable bowel syndrome)   . Irregular intermenstrual bleeding 08/09/2015  . Major depressive disorder   . Migraine   . Mixed connective tissue disease (HCC)   . Pseudobulbar affect   . PTSD (post-traumatic stress disorder)   . Sjogren's syndrome with lung involvement (HCC)   . Thyroid disease   . Vaginal Pap smear, abnormal     Patient Active Problem List   Diagnosis Date Noted  . Encounter for screening  fecal occult blood testing 06/27/2020  . Encounter for gynecological examination with Papanicolaou smear of cervix 06/27/2020  . Chronic migraine without aura, with status migrainosus 03/14/2020  . Closed fracture of lower end of left radius with routine healing 12/16/2019  . Routine screening for STI (sexually transmitted infection) 10/06/2019  . Cervicitis and endocervicitis 02/11/2018  . History of abortion 07/25/2017  . Endometritis 07/25/2017  . Pelvic pain 07/25/2017  . Dyspnea 05/03/2017  . SOB (shortness of breath) 05/03/2017  . 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  . Sjogren's syndrome with lung involvement (HCC) 03/15/2017  . Irregular intermenstrual bleeding 08/09/2015  . Decreased libido 08/09/2015  . Friable cervix 08/09/2015  . Contraceptive management 08/09/2015  . History of abnormal cervical Pap smear 08/09/2015  . Abdominal pain, left lower quadrant 09/21/2013  . Nausea and vomiting 09/20/2013  . Irritable bowel syndrome 05/29/2010  . CLOSED FRACTURE OF HEAD OF RADIUS 04/13/2008  . FX CLOSED FIBULA NOS 04/13/2008    Past Surgical History:  Procedure Laterality Date  . ABLATION    . DILATION AND CURETTAGE OF UTERUS N/A 07/25/2017   Procedure: SUCTION DILATATION AND CURETTAGE;  Surgeon: Tilda Burrow, MD;  Location: [facility];  Service: Gynecology;  Laterality: N/A;  . DILITATION & CURRETTAGE/HYSTROSCOPY WITH NOVASURE ABLATION N/A 09/30/2017   Procedure: DILATATION, HYSTEROSCOPY WITH NOVASURE ENDOMETRIAL ABLATION;  Surgeon: Tilda Burrow, MD;  Location: [facility];  Service: Gynecology;  Laterality: N/A;  . ESOPHAGOGASTRODUODENOSCOPY N/A 11/17/2016   Procedure: ESOPHAGOGASTRODUODENOSCOPY (EGD);  Surgeon: Malissa Hippo, MD;  Location: [facility];  Service: Endoscopy;  Laterality: N/A;  . LAPAROSCOPIC BILATERAL SALPINGECTOMY Bilateral 09/30/2017   Procedure: LAPAROSCOPIC BILATERAL SALPINGECTOMY;  Surgeon: Tilda Burrow, MD;  Location: [facility];  Service:  Gynecology;  Laterality: Bilateral;  . None       OB History    Gravida  5   Para  3   Term  3   Preterm      AB  2   Living  3     SAB  1   IAB  1   Ectopic      Multiple      Live Births              Family History  Problem Relation Age of Onset  . Diabetes Mother   . Hypertension Mother   . Hypothyroidism Mother   . Hypertension Father   . Stroke Father   . Cancer Father        prostate, with metastisis  . Cancer Maternal Grandmother        Gaulbladder  . Kidney disease Paternal Grandmother   . Hypertension Paternal Grandmother   . Hyperlipidemia Paternal Grandmother   . Heart disease Paternal Grandmother   . Heart attack Paternal Grandmother   . Diabetes Daughter   . Hypothyroidism Daughter   . Raynaud syndrome Daughter   . Rheumatologic disease Paternal Grandfather   . Anxiety disorder Sister   . Alcoholism Sister   . Anxiety disorder Brother   . Drug abuse Brother   . Anxiety disorder Sister   . Cancer Maternal Uncle        Lung  . Colon cancer Neg Hx     Social History   Tobacco Use  . Smoking status: Never Smoker  . Smokeless tobacco: Never Used  . Tobacco comment: Never smoker  Vaping Use  . Vaping Use: Never used  Substance Use Topics  . Alcohol use: Yes    Comment: occ.  . Drug use: No    Home Medications Prior to Admission medications   Medication Sig Start Date End Date Taking? Authorizing Provider  oxyCODONE-acetaminophen (PERCOCET/ROXICET) 5-325 MG tablet Take 1 tablet by mouth every 6 (six) hours as needed. 01/13/21  Yes Nathan Stallworth, Raynelle Fanning, PA-C  ADDERALL XR 30 MG 24 hr capsule Take 30 mg by mouth daily. 09/22/19   [provider]  AIMOVIG 70 MG/ML SOAJ Inject 1 Syringe into the skin every 30 (thirty) days. 08/16/19   [provider]  Brexpiprazole (REXULTI) 0.5 MG TABS Take 1 tablet by mouth daily.     [provider]  buPROPion (WELLBUTRIN XL) 150 MG 24 hr tablet Take 150 mg by mouth every evening.      [provider]  busPIRone (BUSPAR) 10 MG tablet Take by mouth. 05/30/20   [provider]  lamoTRIgine (LAMICTAL) 200 MG tablet Take 300 mg by mouth at bedtime. Takes 1.5 tablet    [provider]  levocetirizine (XYZAL) 5 MG tablet Take 5 mg by mouth at bedtime. 09/13/19   [provider]  levothyroxine (SYNTHROID) 137 MCG tablet levothyroxine 137 mcg tablet  TAKE 1 TABLET BY MOUTH EVERY MORNING 30 MINS BEFORE MEALS    [provider]  megestrol (MEGACE) 40 MG tablet Take 1 tablet (40 mg total) by mouth daily. Take 1 daily 06/27/20   Cyril Mourning  A, NP  promethazine (PHENERGAN) 25 MG tablet Take 25 mg by mouth every 6 (six) hours as needed.  03/25/20   [provider]  zolpidem (AMBIEN) 10 MG tablet Take 10 mg by mouth daily. 03/14/20   [provider]    Allergies    Abilify [aripiprazole], Other, Reglan [metoclopramide], Triptans, and Dexamethasone  Review of Systems   Review of Systems  Constitutional: Negative for fever.  Respiratory: Negative for shortness of breath.   Cardiovascular: Negative for chest pain and leg swelling.  Gastrointestinal: Negative for abdominal distention, abdominal pain and constipation.  Genitourinary: Negative for difficulty urinating, dysuria, flank pain, frequency and urgency.  Musculoskeletal: Positive for back pain. Negative for gait problem and joint swelling.  Skin: Negative for rash.  Neurological: Negative for weakness and numbness.  All other systems reviewed and are negative.   Physical Exam Updated Vital Signs BP 125/88 (BP Location: Right Arm)   Pulse 81   Temp 97.8 F (36.6 C) (Oral)   Resp 17   Ht 5\' 3"  (1.6 m)   Wt 93 kg   SpO2 99%   BMI 36.31 kg/m   Physical Exam Vitals and nursing note reviewed.  Constitutional:      Appearance: She is well-developed.  HENT:     Head: Normocephalic.  Eyes:     Conjunctiva/sclera: Conjunctivae normal.  Cardiovascular:      Rate and Rhythm: Normal rate.     Comments: Pedal pulses normal. Pulmonary:     Effort: Pulmonary effort is normal.  Abdominal:     General: Bowel sounds are normal. There is no distension.     Palpations: Abdomen is soft. There is no mass.  Musculoskeletal:        General: Normal range of motion.     Cervical back: Normal range of motion and neck supple.     Lumbar back: Tenderness present. No swelling, edema or spasms.     Comments: Mild ttp mid lumbar, no palpable deformity.   Skin:    General: Skin is warm and dry.  Neurological:     General: No focal deficit present.     Mental Status: She is alert and oriented to person, place, and time.     Sensory: No sensory deficit.     Motor: No tremor or atrophy.     Gait: Gait normal.     Deep Tendon Reflexes:     Reflex Scores:      Patellar reflexes are 1+ on the right side and 1+ on the left side.    Comments: No strength deficit noted in hip and knee flexor and extensor muscle groups.  Ankle flexion and extension intact without deficit.  Patellar reflexes are reduced but equal.  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      ED Results / Procedures / Treatments   Labs (all labs ordered are listed, but only abnormal results are displayed) Labs Reviewed  POC URINE PREG, ED    EKG None  Radiology DG Lumbar Spine Complete  Result Date: 01/13/2021 CLINICAL DATA:  Low back pain 4 days after lifting injury. EXAM: LUMBAR SPINE - COMPLETE 4+ VIEW COMPARISON:  None. FINDINGS: Vertebral body alignment, heights and disc space heights are normal. There is minimal facet arthropathy over the lower lumbar spine. There is no compression fracture or subluxation. IMPRESSION: 1. No acute findings. 2. Minimal facet arthropathy over the lower lumbar spine. Electronically Signed   By: 03/15/2021 M.D.  On: 01/13/2021 13:31    Procedures Procedures   Medications Ordered in ED Medications  dexamethasone  (DECADRON) injection 10 mg (10 mg Intramuscular Given 01/13/21 1330)  oxyCODONE-acetaminophen (PERCOCET/ROXICET) 5-325 MG per tablet 1 tablet (1 tablet Oral Given 01/13/21 1431)    ED Course  I have reviewed the triage vital signs and the nursing notes.  Pertinent labs & imaging results that were available during my care of the patient were reviewed by me and considered in my medical decision making (see chart for details).    MDM Rules/Calculators/A&P                          No neuro deficit on exam or by history to suggest emergent or surgical presentation.  Imaging is reassuring as is exam.  Outlined return precautions and home tx, continuing current regimen, starting the prednisone prescribed by pcp tomorrow as she received decadron injection here for todays dose.  Few oxycodone prescribed with precautions.  Outlined worsened sx that should prompt immediate re-evaluation including distal weakness, bowel/bladder retention/incontinence.  The patient appears reasonably screened and/or stabilized for discharge and I doubt any other medical condition or other [facility] requiring further screening, evaluation, or treatment in the ED at this time prior to discharge.        Final Clinical Impression(s) / ED Diagnoses Final diagnoses:  Acute midline low back pain without sciatica    Rx / DC Orders ED Discharge Orders         Ordered    oxyCODONE-acetaminophen (PERCOCET/ROXICET) 5-325 MG tablet  Every 6 hours PRN        01/13/21 1408           Burgess Amordol, Jayshun Galentine, Cordelia Poche-C 01/14/21 [id number]    Eber HongMiller, Brian, MD 01/14/21 [phone]

## 2021-01-16 ENCOUNTER — Other Ambulatory Visit (HOSPITAL_COMMUNITY): Payer: Self-pay | Admitting: Adult Health Nurse Practitioner

## 2021-01-16 DIAGNOSIS — Z1231 Encounter for screening mammogram for malignant neoplasm of breast: Secondary | ICD-10-CM

## 2021-01-17 ENCOUNTER — Inpatient Hospital Stay (HOSPITAL_COMMUNITY): Admission: RE | Admit: 2021-01-17 | Payer: 59 | Source: Ambulatory Visit

## 2021-01-17 ENCOUNTER — Other Ambulatory Visit (HOSPITAL_COMMUNITY): Payer: Self-pay | Admitting: Family Medicine

## 2021-01-17 ENCOUNTER — Encounter (HOSPITAL_COMMUNITY): Payer: Self-pay

## 2021-01-17 DIAGNOSIS — Z1231 Encounter for screening mammogram for malignant neoplasm of breast: Secondary | ICD-10-CM

## 2021-01-17 DIAGNOSIS — R928 Other abnormal and inconclusive findings on diagnostic imaging of breast: Secondary | ICD-10-CM

## 2021-02-08 ENCOUNTER — Other Ambulatory Visit (HOSPITAL_COMMUNITY): Payer: Self-pay | Admitting: Adult Health Nurse Practitioner

## 2021-02-08 DIAGNOSIS — Z1231 Encounter for screening mammogram for malignant neoplasm of breast: Secondary | ICD-10-CM

## 2021-02-21 ENCOUNTER — Ambulatory Visit (HOSPITAL_COMMUNITY): Payer: 59

## 2021-02-22 ENCOUNTER — Telehealth: Payer: Self-pay | Admitting: Adult Health

## 2021-02-22 NOTE — Telephone Encounter (Signed)
Manufacturer issue with the pharmacy getting the 40mg  of Megestrol  They asked pt to see if we could order 20mg  with directions to take 2 daily  Please advise & notify pt    [facility]

## 2021-02-22 NOTE — Telephone Encounter (Signed)
Discussed alternative option for Megace prescription due to manufacturer issue with Cyril Mourning, NP. She stated that the alternative was appropriate. Called pharmacy to give a verbal order for the alternative. Called pt to explain the series of events. She will be checking with the pharmacy later today to pu her med.

## 2021-02-28 ENCOUNTER — Ambulatory Visit (HOSPITAL_COMMUNITY)
Admission: RE | Admit: 2021-02-28 | Discharge: 2021-02-28 | Disposition: A | Discharge status: Home / Self Care | Payer: 59 | Source: Ambulatory Visit | Attending: Adult Health Nurse Practitioner | Admitting: Adult Health Nurse Practitioner

## 2021-02-28 ENCOUNTER — Other Ambulatory Visit: Payer: Self-pay

## 2021-02-28 DIAGNOSIS — Z1231 Encounter for screening mammogram for malignant neoplasm of breast: Secondary | ICD-10-CM | POA: Diagnosis present

## 2021-04-02 ENCOUNTER — Emergency Department (HOSPITAL_COMMUNITY)
Admission: EM | Admit: 2021-04-02 | Discharge: 2021-04-02 | Disposition: A | Discharge status: Home / Self Care | Payer: 59 | Attending: Emergency Medicine | Admitting: Emergency Medicine

## 2021-04-02 ENCOUNTER — Encounter (HOSPITAL_COMMUNITY): Payer: Self-pay | Admitting: *Deleted

## 2021-04-02 ENCOUNTER — Other Ambulatory Visit: Payer: Self-pay

## 2021-04-02 ENCOUNTER — Emergency Department (HOSPITAL_COMMUNITY): Payer: 59

## 2021-04-02 DIAGNOSIS — Z79899 Other long term (current) drug therapy: Secondary | ICD-10-CM | POA: Diagnosis not present

## 2021-04-02 DIAGNOSIS — E039 Hypothyroidism, unspecified: Secondary | ICD-10-CM | POA: Insufficient documentation

## 2021-04-02 DIAGNOSIS — R519 Headache, unspecified: Secondary | ICD-10-CM | POA: Diagnosis not present

## 2021-04-02 DIAGNOSIS — R0602 Shortness of breath: Secondary | ICD-10-CM | POA: Diagnosis not present

## 2021-04-02 DIAGNOSIS — R0789 Other chest pain: Secondary | ICD-10-CM | POA: Insufficient documentation

## 2021-04-02 LAB — CBC WITH DIFFERENTIAL/PLATELET
Abs Immature Granulocytes: 0.01 10*3/uL (ref 0.00–0.07)
Basophils Absolute: 0 10*3/uL (ref 0.0–0.1)
Basophils Relative: 1 %
Eosinophils Absolute: 0.1 10*3/uL (ref 0.0–0.5)
Eosinophils Relative: 1 %
HCT: 42.9 % (ref 36.0–46.0)
Hemoglobin: 14 g/dL (ref 12.0–15.0)
Immature Granulocytes: 0 %
Lymphocytes Relative: 31 %
Lymphs Abs: 1.9 10*3/uL (ref 0.7–4.0)
MCH: 31.8 pg (ref 26.0–34.0)
MCHC: 32.6 g/dL (ref 30.0–36.0)
MCV: 97.5 fL (ref 80.0–100.0)
Monocytes Absolute: 0.6 10*3/uL (ref 0.1–1.0)
Monocytes Relative: 10 %
Neutro Abs: 3.6 10*3/uL (ref 1.7–7.7)
Neutrophils Relative %: 57 %
Platelets: 313 10*3/uL (ref 150–400)
RBC: 4.4 MIL/uL (ref 3.87–5.11)
RDW: 12.6 % (ref 11.5–15.5)
WBC: 6.3 10*3/uL (ref 4.0–10.5)
nRBC: 0 % (ref 0.0–0.2)

## 2021-04-02 LAB — BASIC METABOLIC PANEL
Anion gap: 8 (ref 5–15)
BUN: 10 mg/dL (ref 6–20)
CO2: 24 mmol/L (ref 22–32)
Calcium: 9.3 mg/dL (ref 8.9–10.3)
Chloride: 105 mmol/L (ref 98–111)
Creatinine, Ser: 1.08 mg/dL — ABNORMAL HIGH (ref 0.44–1.00)
GFR, Estimated: >60 mL/min (ref >60)
Glucose, Bld: 95 mg/dL (ref 70–99)
Potassium: 3.9 mmol/L (ref 3.5–5.1)
Sodium: 137 mmol/L (ref 135–145)

## 2021-04-02 LAB — TROPONIN I (HIGH SENSITIVITY)
Troponin I (High Sensitivity): <2 ng/L (ref <18)
Troponin I (High Sensitivity): <2 ng/L (ref <18)

## 2021-04-02 MED ORDER — LIDOCAINE VISCOUS HCL 2 % MT SOLN
15.0000 mL | Freq: Once | OROMUCOSAL | Status: AC
  Administered 2021-04-02: 15 mL via ORAL
  Filled 2021-04-02: qty 15

## 2021-04-02 MED ORDER — ALUM & MAG HYDROXIDE-SIMETH 200-200-20 MG/5ML PO SUSP
30.0000 mL | Freq: Once | ORAL | Status: AC
  Administered 2021-04-02: 30 mL via ORAL
  Filled 2021-04-02: qty 30

## 2021-04-02 MED ORDER — KETOROLAC TROMETHAMINE 30 MG/ML IJ SOLN
15.0000 mg | Freq: Once | INTRAMUSCULAR | Status: AC
  Administered 2021-04-02: 15 mg via INTRAVENOUS
  Filled 2021-04-02: qty 1

## 2021-04-02 MED ORDER — PROCHLORPERAZINE EDISYLATE 10 MG/2ML IJ SOLN
10.0000 mg | Freq: Once | INTRAMUSCULAR | Status: AC
  Administered 2021-04-02: 10 mg via INTRAVENOUS
  Filled 2021-04-02: qty 2

## 2021-04-02 MED ORDER — DIPHENHYDRAMINE HCL 50 MG/ML IJ SOLN
12.5000 mg | Freq: Once | INTRAMUSCULAR | Status: AC
  Administered 2021-04-02: 12.5 mg via INTRAVENOUS
  Filled 2021-04-02: qty 1

## 2021-04-02 MED ORDER — ONDANSETRON 8 MG PO TBDP: 8.0000 mg | ORAL_TABLET | Freq: Once | ORAL | Status: DC

## 2021-04-02 NOTE — ED Notes (Signed)
Pt reports headache 4/10, but no chestpain at this time. Pt reports that her doctor usually gives her toradol and pt requesting . Message sent to PA

## 2021-04-02 NOTE — ED Triage Notes (Signed)
Mid sternal chest pain

## 2021-04-02 NOTE — ED Provider Notes (Signed)
[facility] EMERGENCY DEPARTMENT Provider Note   CSN: [id number] Arrival date & time: 04/02/21  1314     History No chief complaint on file.   Marisa DubinLori A Dragovich is a 45 y.o. female.  HPI  Patient with significant medical history of anxiety, IBS, migraines, Sjogren's syndrome, fibromyalgia presents to the emergency department with chief complaint of chest pain.  Patient states chest pain came on around 1250 today, states it was in the middle of her chest and slightly radiated up to the top of her chest, she describes the pain as a pressure-like sensation, not necessarily associated with shortness of breath, states she became flushed, felt as if she was going to pass out but denies nausea or vomiting.  She also states that she has a slight headache, states is on the left side of her head, she is taking some medication with seems to help with her headache.  Patient states chest pain is not worsened with food intake, states that it feels worse when she sits up versus laying down, she has no recent cardiac history, she denies smoking history, illicit drug use, nondiabetic.  Patient denies alleviating factors.  Patient denies headaches, fevers, chills, abdominal pain.  Past Medical History:  Diagnosis Date   ADHD    Anxiety    Carpal tunnel syndrome    Chronic fatigue    Connective tissue disorder (HCC)    Contraceptive management 08/09/2015   Decreased libido 08/09/2015   Friable cervix 08/09/2015   History of abnormal cervical Pap smear 08/09/2015   Hypothyroidism    IBS (irritable bowel syndrome)    Irregular intermenstrual bleeding 08/09/2015   Major depressive disorder    Migraine    Mixed connective tissue disease (HCC)    Pseudobulbar affect    PTSD (post-traumatic stress disorder)    Sjogren's syndrome with lung involvement (HCC)    Thyroid disease    Vaginal Pap smear, abnormal     Patient Active Problem List   Diagnosis Date Noted   Encounter for screening fecal occult  blood testing 06/27/2020   Encounter for gynecological examination with Papanicolaou smear of cervix 06/27/2020   Chronic migraine without aura, with status migrainosus 03/14/2020   Closed fracture of lower end of left radius with routine healing 12/16/2019   Routine screening for STI (sexually transmitted infection) 10/06/2019   Cervicitis and endocervicitis 02/11/2018   History of abortion 07/25/2017   Endometritis 07/25/2017   Pelvic pain 07/25/2017   Dyspnea 05/03/2017   SOB (shortness of breath) 05/03/2017   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   Sjogren's syndrome with lung involvement (HCC) 03/15/2017   Irregular intermenstrual bleeding 08/09/2015   Decreased libido 08/09/2015   Friable cervix 08/09/2015   Contraceptive management 08/09/2015   History of abnormal cervical Pap smear 08/09/2015   Abdominal pain, left lower quadrant 09/21/2013   Nausea and vomiting 09/20/2013   Irritable bowel syndrome 05/29/2010   CLOSED FRACTURE OF HEAD OF RADIUS 04/13/2008   FX CLOSED FIBULA NOS 04/13/2008    Past Surgical History:  Procedure Laterality Date   ABLATION     DILATION AND CURETTAGE OF UTERUS N/A 07/25/2017   Procedure: SUCTION DILATATION AND CURETTAGE;  Surgeon: Tilda BurrowFerguson, John V, MD;  Location: [facility];  Service: Gynecology;  Laterality: N/A;   DILITATION & CURRETTAGE/HYSTROSCOPY WITH NOVASURE ABLATION N/A 09/30/2017   Procedure: DILATATION, HYSTEROSCOPY WITH NOVASURE ENDOMETRIAL ABLATION;  Surgeon: Tilda BurrowFerguson, John V, MD;  Location: [facility];  Service: Gynecology;  Laterality: N/A;   ESOPHAGOGASTRODUODENOSCOPY N/A 11/17/2016  Procedure: ESOPHAGOGASTRODUODENOSCOPY (EGD);  Surgeon: Malissa Hippo, MD;  Location: [facility];  Service: Endoscopy;  Laterality: N/A;   LAPAROSCOPIC BILATERAL SALPINGECTOMY Bilateral 09/30/2017   Procedure: LAPAROSCOPIC BILATERAL SALPINGECTOMY;  Surgeon: Tilda Burrow, MD;  Location: [facility];  Service: Gynecology;  Laterality: Bilateral;   None        OB History     Gravida  5   Para  3   Term  3   Preterm      AB  2   Living  3      SAB  1   IAB  1   Ectopic      Multiple      Live Births              Family History  Problem Relation Age of Onset   Diabetes Mother    Hypertension Mother    Hypothyroidism Mother    Hypertension Father    Stroke Father    Cancer Father        prostate, with metastisis   Cancer Maternal Grandmother        Gaulbladder   Kidney disease Paternal Grandmother    Hypertension Paternal Grandmother    Hyperlipidemia Paternal Grandmother    Heart disease Paternal Grandmother    Heart attack Paternal Grandmother    Diabetes Daughter    Hypothyroidism Daughter    Raynaud syndrome Daughter    Rheumatologic disease Paternal Grandfather    Anxiety disorder Sister    Alcoholism Sister    Anxiety disorder Brother    Drug abuse Brother    Anxiety disorder Sister    Cancer Maternal Uncle        Lung   Colon cancer Neg Hx     Social History   Tobacco Use   Smoking status: Never   Smokeless tobacco: Never   Tobacco comments:    Never smoker  Vaping Use   Vaping Use: Never used  Substance Use Topics   Alcohol use: Yes    Comment: occ.   Drug use: No    Home Medications Prior to Admission medications   Medication Sig Start Date End Date Taking? Authorizing Provider  ADDERALL XR 30 MG 24 hr capsule Take 30 mg by mouth daily. 09/22/19   [provider]  AIMOVIG 70 MG/ML SOAJ Inject 1 Syringe into the skin every 30 (thirty) days. 08/16/19   [provider]  Brexpiprazole (REXULTI) 0.5 MG TABS Take 1 tablet by mouth daily.     [provider]  buPROPion (WELLBUTRIN XL) 150 MG 24 hr tablet Take 150 mg by mouth every evening.     [provider]  busPIRone (BUSPAR) 10 MG tablet Take by mouth. 05/30/20   [provider]  lamoTRIgine (LAMICTAL) 200 MG tablet Take 300 mg by mouth at bedtime. Takes 1.5 tablet    [provider]  levocetirizine (XYZAL) 5 MG tablet Take 5 mg by mouth at bedtime. 09/13/19   [provider]  levothyroxine (SYNTHROID) 137 MCG tablet levothyroxine 137 mcg tablet  TAKE 1 TABLET BY MOUTH EVERY MORNING 30 MINS BEFORE MEALS    [provider]  megestrol (MEGACE) 40 MG tablet Take 1 tablet (40 mg total) by mouth daily. Take 1 daily 06/27/20   [name], NP  oxyCODONE-acetaminophen (PERCOCET/ROXICET) 5-325 MG tablet Take 1 tablet by mouth every 6 (six) hours as needed. 01/13/21   Burgess Amor, PA-C  promethazine (PHENERGAN) 25 MG tablet Take  25 mg by mouth every 6 (six) hours as needed.  03/25/20   [provider]  zolpidem (AMBIEN) 10 MG tablet Take 10 mg by mouth daily. 03/14/20   [provider]    Allergies    Abilify [aripiprazole], Other, Reglan [metoclopramide], Triptans, and Dexamethasone  Review of Systems   Review of Systems  Constitutional:  Positive for fever. Negative for chills.  HENT:  Negative for congestion.   Respiratory:  Negative for shortness of breath.   Cardiovascular:  Positive for chest pain.  Gastrointestinal:  Negative for abdominal pain, diarrhea, nausea and vomiting.  Genitourinary:  Negative for dysuria.  Musculoskeletal:  Negative for myalgias.  Skin:  Negative for rash.  Neurological:  Positive for headaches. Negative for dizziness.   Physical Exam Updated Vital Signs BP 110/75   Pulse 71   Temp 98.3 F (36.8 C) (Oral)   Resp (!) 22   SpO2 97%   Physical Exam Vitals and nursing note reviewed.  Constitutional:      General: She is not in acute distress.    Appearance: She is not ill-appearing.  HENT:     Head: Normocephalic and atraumatic.     Nose: No congestion.  Eyes:     Conjunctiva/sclera: Conjunctivae normal.  Cardiovascular:     Rate and Rhythm: Normal rate and regular rhythm.     Pulses: Normal pulses.     Heart sounds: No murmur heard.   No friction rub. No gallop.      Comments: Chest pain was not reproducible. Pulmonary:     Effort: No respiratory distress.     Breath sounds: No wheezing, rhonchi or rales.  Abdominal:     Palpations: Abdomen is soft.     Tenderness: There is no abdominal tenderness. There is no right CVA tenderness or left CVA tenderness.  Musculoskeletal:     Right lower leg: No edema.     Left lower leg: No edema.  Skin:    General: Skin is warm and dry.  Neurological:     Mental Status: She is alert.  Psychiatric:        Mood and Affect: Mood normal.    ED Results / Procedures / Treatments   Labs (all labs ordered are listed, but only abnormal results are displayed) Labs Reviewed  BASIC METABOLIC PANEL - Abnormal; Notable for the following components:      Result Value   Creatinine, Ser 1.08 (*)    All other components within normal limits  CBC WITH DIFFERENTIAL/PLATELET  TROPONIN I (HIGH SENSITIVITY)  TROPONIN I (HIGH SENSITIVITY)    EKG EKG Interpretation  Date/Time:  Monday April 02 2021 13:27:14 EDT Ventricular Rate:  80 PR Interval:  175 QRS Duration: 91 QT Interval:  364 QTC Calculation: 420 R Axis:   -36 Text Interpretation: Sinus rhythm Left axis deviation Low voltage, precordial leads Abnormal R-wave progression, late transition Confirmed by Eber Hong ([id number]) on 04/02/2021 2:16:42 PM  Radiology DG Chest Port 1 View  Result Date: 04/02/2021 CLINICAL DATA:  Mid chest pain starting today. EXAM: PORTABLE CHEST 1 VIEW COMPARISON:  03/11/2020 FINDINGS: Heart, mediastinum and hila are within normal limits. Clear lungs.  No pleural effusion or pneumothorax. Skeletal structures are grossly intact. IMPRESSION: No active disease. Electronically Signed   By: Amie Portland M.D.   On: 04/02/2021 15:07    Procedures Procedures   Medications Ordered in ED Medications  ondansetron (ZOFRAN-ODT) disintegrating tablet 8 mg (8 mg Oral Patient Refused/Not Given 04/02/21 1400)  ketorolac (TORADOL) 30 MG/ML injection 15  mg (15 mg Intravenous Given 04/02/21 1456)  prochlorperazine (COMPAZINE) injection 10 mg (10 mg Intravenous Given 04/02/21 1450)  diphenhydrAMINE (BENADRYL) injection 12.5 mg (12.5 mg Intravenous Given 04/02/21 1459)  alum & mag hydroxide-simeth (MAALOX/MYLANTA) 200-200-20 MG/5ML suspension 30 mL (30 mLs Oral Given 04/02/21 1539)    And  lidocaine (XYLOCAINE) 2 % viscous mouth solution 15 mL (15 mLs Oral Given 04/02/21 1539)    ED Course  I have reviewed the triage vital signs and the nursing notes.  Pertinent labs & imaging results that were available during my care of the patient were reviewed by me and considered in my medical decision making (see chart for details).    MDM Rules/Calculators/A&P                         Initial impression-patient presents with substernal chest pain.  She is alert, does not appear in acute stress, vital signs reassuring.  Will obtain chest pain work-up, provide patient with antiemetics as she states she feels slightly nauseous.  Reassess.  Work-up-CBC unremarkable, BMP shows creatinine 1.08, negative delta troponin. chest x-ray negative for acute findings.  EKG sinus without signs of ischemia  Reassessment-patient was reassessed after GI cocktail, states she is feeling much better, she has no complaints at this time.  Vital signs remained stable.  Patient is agreement for discharge.  Rule out-I have low suspicion for ACS as history is atypical, patient has no cardiac history, EKG was sinus rhythm without signs of ischemia, patient had a delta troponin.  Low suspicion for PE as patient denies pleuritic chest pain, shortness of breath, patient denies leg pain, no pedal edema noted on exam, patient was PERC negative.  Low suspicion for AAA or aortic dissection as history is atypical, patient has low risk factors.  Low suspicion for systemic infection as patient is nontoxic-appearing, vital signs reassuring, no obvious source infection noted on exam.   Plan- Chest  pain since resolved-etiology seems consistent with esophageal spasms, will recommend PPIs as needed, follow-up with GI for further evaluation.  Vital signs have remained stable, no indication for hospital admission.  Patient given at home care as well strict return precautions.  Patient verbalized that they understood agreed to said plan.  Final Clinical Impression(s) / ED Diagnoses Final diagnoses:  Atypical chest pain    Rx / DC Orders ED Discharge Orders     None        Carroll Sage, PA-C 04/02/21 1613    Eber Hong, MD 04/04/21 [phone]

## 2021-04-02 NOTE — Discharge Instructions (Addendum)
Lab work and imaging all reassuring.  Please continue with all home medications as prescribed.  Recommend follow-up with GI for further evaluation if symptoms persist.  Come back to the emergency department if you develop chest pain, shortness of breath, severe abdominal pain, uncontrolled nausea, vomiting, diarrhea.

## 2021-07-02 ENCOUNTER — Other Ambulatory Visit: Payer: Self-pay | Admitting: Adult Health

## 2021-07-13 ENCOUNTER — Other Ambulatory Visit: Payer: Self-pay

## 2021-07-13 ENCOUNTER — Encounter: Payer: Self-pay | Admitting: Emergency Medicine

## 2021-07-13 ENCOUNTER — Ambulatory Visit
Admission: EM | Admit: 2021-07-13 | Discharge: 2021-07-13 | Disposition: A | Discharge status: Home / Self Care | Payer: 59 | Attending: Internal Medicine | Admitting: Internal Medicine

## 2021-07-13 DIAGNOSIS — N3001 Acute cystitis with hematuria: Secondary | ICD-10-CM

## 2021-07-13 LAB — POCT URINALYSIS DIP (MANUAL ENTRY)
Bilirubin, UA: NEGATIVE
Glucose, UA: NEGATIVE mg/dL
Ketones, POC UA: NEGATIVE mg/dL
Nitrite, UA: POSITIVE — AB
Protein Ur, POC: >=300 mg/dL — AB
Spec Grav, UA: 1.02 (ref 1.010–1.025)
Urobilinogen, UA: 0.2 E.U./dL
pH, UA: 6 (ref 5.0–8.0)

## 2021-07-13 MED ORDER — FLUCONAZOLE 150 MG PO TABS: 150.0000 mg | ORAL_TABLET | Freq: Once | ORAL | 0 refills | Status: AC

## 2021-07-13 MED ORDER — CEPHALEXIN 500 MG PO CAPS: 500.0000 mg | ORAL_CAPSULE | Freq: Two times a day (BID) | ORAL | 0 refills | Status: AC

## 2021-07-13 MED ORDER — KETOROLAC TROMETHAMINE 30 MG/ML IJ SOLN
30.0000 mg | Freq: Once | INTRAMUSCULAR | Status: AC
  Administered 2021-07-13: 30 mg via INTRAMUSCULAR

## 2021-07-13 NOTE — Discharge Instructions (Addendum)
Increase oral fluid intake Take medications as prescribed Take ibuprofen or Tylenol as needed for pain If symptoms worsen please return to urgent care to be reevaluated.

## 2021-07-13 NOTE — ED Provider Notes (Signed)
[facility]    CSN: [id number] Arrival date & time: 07/13/21  1545      History   Chief Complaint No chief complaint on file.   HPI Marisa Johnson is a 45 y.o. female comes to urgent care with a 1 week history of right lower back pain, dysuria, urgency or frequency as well as pressure in the lower abdomen.  Symptoms have been persistent for a week.  No nausea, vomiting, fever or chills.  Patient denies any vaginal discharge.  She took Azo several days ago but she continues to have symptoms. HPI  Past Medical History:  Diagnosis Date   ADHD    Anxiety    Carpal tunnel syndrome    Chronic fatigue    Connective tissue disorder (HCC)    Contraceptive management 08/09/2015   Decreased libido 08/09/2015   Friable cervix 08/09/2015   History of abnormal cervical Pap smear 08/09/2015   Hypothyroidism    IBS (irritable bowel syndrome)    Irregular intermenstrual bleeding 08/09/2015   Major depressive disorder    Migraine    Mixed connective tissue disease (HCC)    Pseudobulbar affect    PTSD (post-traumatic stress disorder)    Sjogren's syndrome with lung involvement (HCC)    Thyroid disease    Vaginal Pap smear, abnormal     Patient Active Problem List   Diagnosis Date Noted   Encounter for screening fecal occult blood testing 06/27/2020   Encounter for gynecological examination with Papanicolaou smear of cervix 06/27/2020   Chronic migraine without aura, with status migrainosus 03/14/2020   Closed fracture of lower end of left radius with routine healing 12/16/2019   Routine screening for STI (sexually transmitted infection) 10/06/2019   Cervicitis and endocervicitis 02/11/2018   History of abortion 07/25/2017   Endometritis 07/25/2017   Pelvic pain 07/25/2017   Dyspnea 05/03/2017   SOB (shortness of breath) 05/03/2017   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   Sjogren's syndrome with lung involvement (HCC) 03/15/2017   Irregular intermenstrual bleeding  08/09/2015   Decreased libido 08/09/2015   Friable cervix 08/09/2015   Contraceptive management 08/09/2015   History of abnormal cervical Pap smear 08/09/2015   Abdominal pain, left lower quadrant 09/21/2013   Nausea and vomiting 09/20/2013   Irritable bowel syndrome 05/29/2010   CLOSED FRACTURE OF HEAD OF RADIUS 04/13/2008   FX CLOSED FIBULA NOS 04/13/2008    Past Surgical History:  Procedure Laterality Date   ABLATION     DILATION AND CURETTAGE OF UTERUS N/A 07/25/2017   Procedure: SUCTION DILATATION AND CURETTAGE;  Surgeon: Tilda Burrow, MD;  Location: [facility];  Service: Gynecology;  Laterality: N/A;   DILITATION & CURRETTAGE/HYSTROSCOPY WITH NOVASURE ABLATION N/A 09/30/2017   Procedure: DILATATION, HYSTEROSCOPY WITH NOVASURE ENDOMETRIAL ABLATION;  Surgeon: Tilda Burrow, MD;  Location: [facility];  Service: Gynecology;  Laterality: N/A;   ESOPHAGOGASTRODUODENOSCOPY N/A 11/17/2016   Procedure: ESOPHAGOGASTRODUODENOSCOPY (EGD);  Surgeon: Malissa Hippo, MD;  Location: [facility];  Service: Endoscopy;  Laterality: N/A;   LAPAROSCOPIC BILATERAL SALPINGECTOMY Bilateral 09/30/2017   Procedure: LAPAROSCOPIC BILATERAL SALPINGECTOMY;  Surgeon: Tilda Burrow, MD;  Location: [facility];  Service: Gynecology;  Laterality: Bilateral;   None      OB History     Gravida  5   Para  3   Term  3   Preterm      AB  2   Living  3      SAB  1  IAB  1   Ectopic      Multiple      Live Births               Home Medications    Prior to Admission medications   Medication Sig Start Date End Date Taking? Authorizing Provider  cephALEXin (KEFLEX) 500 MG capsule Take 1 capsule (500 mg total) by mouth 2 (two) times daily for 5 days. 07/13/21 07/18/21 Yes Nadja Lina, Britta Mccreedy, MD  fluconazole (DIFLUCAN) 150 MG tablet Take 1 tablet (150 mg total) by mouth once for 1 dose. 07/13/21 07/13/21 Yes Dazhane Villagomez, Britta Mccreedy, MD  tirzepatide [facility]) 7.5 MG/0.5ML Pen Inject 7.5 mg into the skin  once a week.   Yes [provider]  ADDERALL XR 30 MG 24 hr capsule Take 30 mg by mouth daily. 09/22/19   [provider]  AIMOVIG 70 MG/ML SOAJ Inject 1 Syringe into the skin every 30 (thirty) days. 08/16/19   [provider]  Brexpiprazole (REXULTI) 0.5 MG TABS Take 1 tablet by mouth daily.     [provider]  buPROPion (WELLBUTRIN XL) 150 MG 24 hr tablet Take 150 mg by mouth every evening.     [provider]  busPIRone (BUSPAR) 10 MG tablet Take by mouth. 05/30/20   [provider]  lamoTRIgine (LAMICTAL) 200 MG tablet Take 300 mg by mouth at bedtime. Takes 1.5 tablet    [provider]  levocetirizine (XYZAL) 5 MG tablet Take 5 mg by mouth at bedtime. 09/13/19   [provider]  levothyroxine (SYNTHROID) 137 MCG tablet levothyroxine 137 mcg tablet  TAKE 1 TABLET BY MOUTH EVERY MORNING 30 MINS BEFORE MEALS    [provider]  megestrol (MEGACE) 40 MG tablet TAKE 1 TABLET(40 MG) BY MOUTH DAILY 07/02/21   [name], NP  oxyCODONE-acetaminophen (PERCOCET/ROXICET) 5-325 MG tablet Take 1 tablet by mouth every 6 (six) hours as needed. 01/13/21   Burgess Amor, PA-C  promethazine (PHENERGAN) 25 MG tablet Take 25 mg by mouth every 6 (six) hours as needed.  03/25/20   [provider]  zolpidem (AMBIEN) 10 MG tablet Take 10 mg by mouth daily. 03/14/20   [provider]    Family History Family History  Problem Relation Age of Onset   Diabetes Mother    Hypertension Mother    Hypothyroidism Mother    Hypertension Father    Stroke Father    Cancer Father        prostate, with metastisis   Cancer Maternal Grandmother        Gaulbladder   Kidney disease Paternal Grandmother    Hypertension Paternal Grandmother    Hyperlipidemia Paternal Grandmother    Heart disease Paternal Grandmother    Heart attack Paternal Grandmother    Diabetes Daughter    Hypothyroidism Daughter    Raynaud syndrome  Daughter    Rheumatologic disease Paternal Grandfather    Anxiety disorder Sister    Alcoholism Sister    Anxiety disorder Brother    Drug abuse Brother    Anxiety disorder Sister    Cancer Maternal Uncle        Lung   Colon cancer Neg Hx     Social History Social History   Tobacco Use   Smoking status: Never   Smokeless tobacco: Never   Tobacco comments:    Never smoker  Vaping Use   Vaping Use: Never used  Substance Use Topics   Alcohol use: Yes  Comment: occ.   Drug use: No     Allergies   Abilify [aripiprazole], Other, Reglan [metoclopramide], Triptans, and Dexamethasone   Review of Systems Review of Systems  Constitutional: Negative.   Gastrointestinal:  Positive for abdominal pain. Negative for nausea and vomiting.  Genitourinary:  Positive for dysuria, frequency and urgency. Negative for hematuria, pelvic pain, vaginal discharge and vaginal pain.  Musculoskeletal:  Positive for back pain.    Physical Exam Triage Vital Signs ED Triage Vitals [07/13/21 1650]  Enc Vitals Group     BP (!) 157/99     Pulse Rate 96     Resp 19     Temp 98.2 F (36.8 C)     Temp Source Oral     SpO2 98 %     Weight      Height      Head Circumference      Peak Flow      Pain Score 8     Pain Loc      Pain Edu?      Excl. in GC?    No data found.  Updated Vital Signs BP (!) 157/99 (BP Location: Right Arm)   Pulse 96   Temp 98.2 F (36.8 C) (Oral)   Resp 19   SpO2 98%   Visual Acuity Right Eye Distance:   Left Eye Distance:   Bilateral Distance:    Right Eye Near:   Left Eye Near:    Bilateral Near:     Physical Exam Vitals and nursing note reviewed.  Constitutional:      General: She is not in acute distress.    Appearance: She is not ill-appearing.  Cardiovascular:     Rate and Rhythm: Normal rate and regular rhythm.     Pulses: Normal pulses.     Heart sounds: Normal heart sounds.  Pulmonary:     Effort: Pulmonary effort is normal.      Breath sounds: Normal breath sounds.  Abdominal:     General: Bowel sounds are normal.     Palpations: There is no mass.     Tenderness: There is no abdominal tenderness.     Hernia: No hernia is present.  Musculoskeletal:        General: Normal range of motion.  Skin:    General: Skin is warm.  Neurological:     Mental Status: She is alert.     UC Treatments / Results  Labs (all labs ordered are listed, but only abnormal results are displayed) Labs Reviewed  POCT URINALYSIS DIP (MANUAL ENTRY) - Abnormal; Notable for the following components:      Result Value   Clarity, UA cloudy (*)    Blood, UA large (*)    Protein Ur, POC >=300 (*)    Nitrite, UA Positive (*)    Leukocytes, UA Large (3+) (*)    All other components within normal limits  URINE CULTURE    EKG   Radiology No results found.  Procedures Procedures (including critical care time)  Medications Ordered in UC Medications  ketorolac (TORADOL) 30 MG/ML injection 30 mg (30 mg Intramuscular Given 07/13/21 1747)    Initial Impression / Assessment and Plan / UC Course  I have reviewed the triage vital signs and the nursing notes.  Pertinent labs & imaging results that were available during my care of the patient were reviewed by me and considered in my medical decision making (see chart for details).     1.  Acute cystitis with hematuria: Point-of-care urinalysis is positive for leukocyte Estrace, nitrates and blood Urine cultures sent Keflex 500 mg twice daily for 5 days Fluconazole 150 mg x 1 dose. We will call patient if labs are abnormal Return to urgent care if symptoms worsen. Final Clinical Impressions(s) / UC Diagnoses   Final diagnoses:  Acute cystitis with hematuria     Discharge Instructions      Increase oral fluid intake Take medications as prescribed Take ibuprofen or Tylenol as needed for pain If symptoms worsen please return to urgent care to be reevaluated.   ED  Prescriptions     Medication Sig Dispense Auth. Provider   cephALEXin (KEFLEX) 500 MG capsule Take 1 capsule (500 mg total) by mouth 2 (two) times daily for 5 days. 10 capsule Athel Merriweather, Britta Mccreedy, MD   fluconazole (DIFLUCAN) 150 MG tablet Take 1 tablet (150 mg total) by mouth once for 1 dose. 2 tablet Senya Hinzman, Britta Mccreedy, MD      PDMP not reviewed this encounter.   Merrilee Jansky, MD 07/13/21 [phone]

## 2021-07-13 NOTE — ED Triage Notes (Signed)
RT flank pain that radiates around to RT lower abd that started last week.

## 2021-07-16 LAB — URINE CULTURE: Culture: >=100000 — AB

## 2022-01-31 ENCOUNTER — Other Ambulatory Visit (HOSPITAL_COMMUNITY): Payer: Self-pay | Admitting: Adult Health Nurse Practitioner

## 2022-01-31 DIAGNOSIS — Z1231 Encounter for screening mammogram for malignant neoplasm of breast: Secondary | ICD-10-CM

## 2022-02-07 ENCOUNTER — Other Ambulatory Visit: Payer: Self-pay | Admitting: Adult Health

## 2022-02-20 ENCOUNTER — Other Ambulatory Visit: Payer: Self-pay | Admitting: Adult Health

## 2022-02-20 ENCOUNTER — Ambulatory Visit (INDEPENDENT_AMBULATORY_CARE_PROVIDER_SITE_OTHER): Payer: 59 | Admitting: Adult Health

## 2022-02-20 ENCOUNTER — Telehealth: Payer: Self-pay | Admitting: Adult Health

## 2022-02-20 ENCOUNTER — Encounter: Payer: Self-pay | Admitting: Adult Health

## 2022-02-20 VITALS — BP 120/88 | HR 98 | Ht 63.0 in | Wt 184.0 lb

## 2022-02-20 DIAGNOSIS — F908 Attention-deficit hyperactivity disorder, other type: Secondary | ICD-10-CM

## 2022-02-20 DIAGNOSIS — F319 Bipolar disorder, unspecified: Secondary | ICD-10-CM | POA: Diagnosis not present

## 2022-02-20 DIAGNOSIS — F331 Major depressive disorder, recurrent, moderate: Secondary | ICD-10-CM | POA: Diagnosis not present

## 2022-02-20 DIAGNOSIS — F428 Other obsessive-compulsive disorder: Secondary | ICD-10-CM

## 2022-02-20 DIAGNOSIS — F411 Generalized anxiety disorder: Secondary | ICD-10-CM

## 2022-02-20 MED ORDER — AMPHETAMINE-DEXTROAMPHET ER 10 MG PO CP24
10.0000 mg | ORAL_CAPSULE | Freq: Every day | ORAL | 0 refills | Status: DC
Start: 2022-02-20 — End: 2022-03-20

## 2022-02-20 MED ORDER — AMPHETAMINE-DEXTROAMPHETAMINE 10 MG PO TABS: 10.0000 mg | ORAL_TABLET | Freq: Every day | ORAL | 0 refills | Status: DC

## 2022-02-20 MED ORDER — LAMOTRIGINE 25 MG PO TABS: ORAL_TABLET | ORAL | 2 refills | Status: DC

## 2022-02-20 MED ORDER — BUPROPION HCL ER (XL) 150 MG PO TB24: 150.0000 mg | ORAL_TABLET | Freq: Every evening | ORAL | 2 refills | Status: DC

## 2022-02-20 MED ORDER — BREXPIPRAZOLE 2 MG PO TABS: 2.0000 mg | ORAL_TABLET | Freq: Every day | ORAL | 2 refills | Status: DC

## 2022-02-20 NOTE — Progress Notes (Signed)
[facility] MD/PA/NP Initial Note ? ?02/20/2022 11:19 AM ?Marisa Johnson  ?MRN:  [id number] ? ?Chief Complaint:  ? ?HPI:  ? ?Referred by PCP - working with TSH regulation. ? ?Patient seen today for initial psychiatric evaluation - has been followed by other mental health providers over the years. ? ?Describes mood today as "not the best". Pleasant. Tearful at times. Mood symptoms - reports depression, anxiety, and irritability. Reports worry and rumination. Reports obsessive thoughts and acts. Likes things in order and in place. Denies recent panic attacks. Reports mood fluctuations - diagnosed with Bipolar in 2011 - mania "I felt like she was super woman". Was admitted at [facility] for a weekend and released. Has continued her mental health care since being diagnosed and feels she has maintained stability until medication changes made over the past 5 months. She would like to switch back to the medications she did well on. Has not felt well since the addition of Lithium - "I feel out of it". Also notes she is having issues with her thyroid. PCP is working with her to regulate, but levels are low. Stating "I feel bad". Reports feeling lethargic and zoned out. Has continued to work, but has required some time away with continued decline. Currently approved for FMLA and hopes to continue while medications are changed. Varying interest and motivation. Taking medications as prescribed. Working with a Paramedic - DBT. ?Energy levels lower. Active, does not have a regular exercise routine.  Recently joined a gym. Walking some days. ?Enjoys some usual interests and activities. Divorced. Dating. Has 3 children in college. Spending time with family. ?Appetite adequate.  Weight loss with Mounjaro 217 to 184 pounds. ?Sleeps well most nights. Averages 7 hours with 10mg  of Ambien. ?Focus and concentration difficulties. Completing tasks. Managing aspects of household. 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 ?Denies SI or HI.   ?Denies AH or VH. ?Consumes alcohol - one drink a week. ?Denies THC use. ?History of self harm - clean since February of 2022. ? ?Previous medication trials: Adderall, Abilify, Ambien Clonazepam, Wellbutrin, Rexulti, Buspar, Lamictal, Lithium, Adderall XR ? ?Visit Diagnosis:  ?  ICD-10-CM   ?1. Bipolar I disorder (HCC)  F31.9 lamoTRIgine (LAMICTAL) 25 MG tablet  ?  brexpiprazole (REXULTI) 2 MG TABS tablet  ?  ?2. Attention deficit hyperactivity disorder (ADHD), other type  F90.8 amphetamine-dextroamphetamine (ADDERALL) 10 MG tablet  ?  ?3. Major depressive disorder, recurrent episode, moderate (HCC)  F33.1 buPROPion (WELLBUTRIN XL) 150 MG 24 hr tablet  ?  ?4. Obsessional thoughts  F42.8   ?  ?5. Generalized anxiety disorder  F41.1   ?  ? ? ?Past Psychiatric History: Admitted to [facility] in 2011. ? ?Past Medical History:  ?Past Medical History:  ?Diagnosis Date  ? ADHD   ? Anxiety   ? Carpal tunnel syndrome   ? Chronic fatigue   ? Connective tissue disorder (HCC)   ? Contraceptive management 08/09/2015  ? Decreased libido 08/09/2015  ? Friable cervix 08/09/2015  ? History of abnormal cervical Pap smear 08/09/2015  ? Hypothyroidism   ? IBS (irritable bowel syndrome)   ? Irregular intermenstrual bleeding 08/09/2015  ? Major depressive disorder   ? Migraine   ? Mixed connective tissue disease (HCC)   ? Pseudobulbar affect   ? PTSD (post-traumatic stress disorder)   ? Sjogren's syndrome with lung involvement (HCC)   ? Thyroid disease   ? Vaginal Pap smear, abnormal   ?  ?Past Surgical History:  ?Procedure  Laterality Date  ? ABLATION    ? DILATION AND CURETTAGE OF UTERUS N/A 07/25/2017  ? Procedure: SUCTION DILATATION AND CURETTAGE;  Surgeon: Tilda Burrow, MD;  Location: [facility];  Service: Gynecology;  Laterality: N/A;  ? DILITATION & CURRETTAGE/HYSTROSCOPY WITH NOVASURE ABLATION N/A 09/30/2017  ? Procedure: DILATATION, HYSTEROSCOPY WITH NOVASURE ENDOMETRIAL ABLATION;  Surgeon: Tilda Burrow, MD;  Location: [facility];  Service: Gynecology;  Laterality: N/A;  ? ESOPHAGOGASTRODUODENOSCOPY N/A 11/17/2016  ? Procedure: ESOPHAGOGASTRODUODENOSCOPY (EGD);  Surgeon: Malissa Hippo, MD;  [facility];  Service: Endoscopy;  Laterality: N/A;  ? LAPAROSCOPIC BILATERAL SALPINGECTOMY Bilateral 09/30/2017  ? Procedure: LAPAROSCOPIC BILATERAL SALPINGECTOMY;  Surgeon: Tilda Burrow, MD;  Location: [facility];  Service: Gynecology;  Laterality: Bilateral;  ? None    ? ? ?Family Psychiatric History:  Family history of mental illness - mother with mental health issues, brother schizophrenia.  ? ?Family History:  ?Family History  ?Problem Relation Age of Onset  ? Diabetes Mother   ? Hypertension Mother   ? Hypothyroidism Mother   ? Hypertension Father   ? Stroke Father   ? Cancer Father   ?     prostate, with metastisis  ? Cancer Maternal Grandmother   ?     Gaulbladder  ? Kidney disease Paternal Grandmother   ? Hypertension Paternal Grandmother   ? Hyperlipidemia Paternal Grandmother   ? Heart disease Paternal Grandmother   ? Heart attack Paternal Grandmother   ? Diabetes Daughter   ? Hypothyroidism Daughter   ? Raynaud syndrome Daughter   ? Rheumatologic disease Paternal Grandfather   ? Anxiety disorder Sister   ? Alcoholism Sister   ? Anxiety disorder Brother   ? Drug abuse Brother   ? Anxiety disorder Sister   ? Cancer Maternal Uncle   ?     Lung  ? Colon cancer Neg Hx   ? ? ?Social History:  ?Social History  ? ?Socioeconomic History  ? Marital status: Legally Separated  ?  Spouse name: Not on file  ? Number of children: 3  ? Years of education: Not on file  ? Highest education level: Not on file  ?Occupational History  ? Occupation: Designer, jewellery  ?  Employer: [employer]  ?  Comment: ED, 12 years  ?Tobacco Use  ? Smoking status: Never  ? Smokeless tobacco: Never  ? Tobacco comments:  ?  Never smoker  ?Vaping Use  ? Vaping Use: Never used  ?Substance and Sexual Activity  ? Alcohol use: Yes  ?  Comment: occ.  ? Drug use: No   ? Sexual activity: Yes  ?  Birth control/protection: Surgical  ?  Comment: tubal and ablation  ?Other Topics Concern  ? Not on file  ?Social History Narrative  ? Not on file  ? ?Social Determinants of Health  ? ?Financial Resource Strain: Not on file  ?Food Insecurity: Not on file  ?Transportation Needs: Not on file  ?Physical Activity: Not on file  ?Stress: Not on file  ?Social Connections: Not on file  ? ? ?Allergies:  ?Allergies  ?Allergen Reactions  ? Abilify [Aripiprazole] Other (See Comments)  ?  Reaction:  Restless leg syndrome   ? Other   ?  Bean Sprouts  ? Reglan [Metoclopramide] Other (See Comments)  ?  Reaction:  Hallucinations   ? Triptans Other (See Comments)  ?  Elevated blood pressure ?  ? Dexamethasone Rash  ? ? ?Metabolic Disorder Labs: ?No results found  for: HGBA1C, MPG ?No results found for: PROLACTIN ?No results found for: CHOL, TRIG, HDL, CHOLHDL, VLDL, LDLCALC ?Lab Results  ?Component Value Date  ? TSH 0.218 (L) 05/03/2017  ? ? ?Therapeutic Level Labs: ?No results found for: LITHIUM ?No results found for: VALPROATE ?No components found for:  CBMZ ? ?Current Medications: ?Current Outpatient Medications  ?Medication Sig Dispense Refill  ? amphetamine-dextroamphetamine (ADDERALL) 10 MG tablet Take 1 tablet (10 mg total) by mouth daily with breakfast. 30 tablet 0  ? brexpiprazole (REXULTI) 2 MG TABS tablet Take 1 tablet (2 mg total) by mouth daily. 30 tablet 2  ? lamoTRIgine (LAMICTAL) 25 MG tablet Take one tablet daily for 14 days, then increase to two tablets daily. 60 tablet 2  ? AIMOVIG 70 MG/ML SOAJ Inject 1 Syringe into the skin every 30 (thirty) days.    ? buPROPion (WELLBUTRIN XL) 150 MG 24 hr tablet Take 1 tablet (150 mg total) by mouth every evening. 30 tablet 2  ? levocetirizine (XYZAL) 5 MG tablet Take 5 mg by mouth at bedtime.    ? levothyroxine (SYNTHROID) 137 MCG tablet levothyroxine 137 mcg tablet ? TAKE 1 TABLET BY MOUTH EVERY MORNING 30 MINS BEFORE MEALS    ? megestrol (MEGACE)  40 MG tablet TAKE 1 TABLET(40 MG) BY MOUTH DAILY 30 tablet 0  ? oxyCODONE-acetaminophen (PERCOCET/ROXICET) 5-325 MG tablet Take 1 tablet by mouth every 6 (six) hours as needed. 10 tablet 0  ? promethazi

## 2022-02-20 NOTE — Telephone Encounter (Signed)
The IR was the only one out there - switched to the XR formula.

## 2022-02-20 NOTE — Telephone Encounter (Signed)
Pt called at 2:30 pm and said that the adderall you sent in was not xr . She takes adderall 10 mg xr. Did you want her to be on the regular addderall instead of the xr. Please send the xr if that is what you want her to take. Please call her and let her know at [phone] ?

## 2022-02-26 ENCOUNTER — Other Ambulatory Visit: Payer: 59 | Admitting: Adult Health

## 2022-03-05 ENCOUNTER — Telehealth: Payer: Self-pay | Admitting: Adult Health

## 2022-03-05 NOTE — Telephone Encounter (Signed)
Received fax from [facility] for completion of Health Care Provider Certification. Placed in Traci's box.

## 2022-03-05 NOTE — Telephone Encounter (Signed)
Noted received. Will complete

## 2022-03-06 ENCOUNTER — Ambulatory Visit (HOSPITAL_COMMUNITY)
Admission: RE | Admit: 2022-03-06 | Discharge: 2022-03-06 | Disposition: A | Discharge status: Home / Self Care | Payer: 59 | Source: Ambulatory Visit | Attending: Adult Health Nurse Practitioner | Admitting: Adult Health Nurse Practitioner

## 2022-03-06 DIAGNOSIS — Z1231 Encounter for screening mammogram for malignant neoplasm of breast: Secondary | ICD-10-CM | POA: Diagnosis present

## 2022-03-11 NOTE — Telephone Encounter (Signed)
Paper work completed but will have Dr. Jennelle Human review and sign while Rene Kocher out of office this week.

## 2022-03-13 ENCOUNTER — Encounter: Payer: Self-pay | Admitting: Adult Health

## 2022-03-13 ENCOUNTER — Ambulatory Visit (INDEPENDENT_AMBULATORY_CARE_PROVIDER_SITE_OTHER): Payer: 59 | Admitting: Adult Health

## 2022-03-13 VITALS — BP 129/89 | HR 94 | Ht 63.0 in | Wt 182.0 lb

## 2022-03-13 DIAGNOSIS — N921 Excessive and frequent menstruation with irregular cycle: Secondary | ICD-10-CM | POA: Diagnosis not present

## 2022-03-13 DIAGNOSIS — Z1211 Encounter for screening for malignant neoplasm of colon: Secondary | ICD-10-CM

## 2022-03-13 DIAGNOSIS — Z0289 Encounter for other administrative examinations: Secondary | ICD-10-CM

## 2022-03-13 DIAGNOSIS — Z01419 Encounter for gynecological examination (general) (routine) without abnormal findings: Secondary | ICD-10-CM | POA: Insufficient documentation

## 2022-03-13 LAB — HEMOCCULT GUIAC POC 1CARD (OFFICE): Fecal Occult Blood, POC: NEGATIVE

## 2022-03-13 MED ORDER — MEGESTROL ACETATE 40 MG PO TABS
ORAL_TABLET | ORAL | 6 refills | Status: DC
Start: 2022-03-13 — End: 2022-10-15

## 2022-03-13 NOTE — Progress Notes (Signed)
Patient ID: Marisa Johnson, female   DOB: 1976-05-15, 46 y.o.   MRN: [id number] History of Present Illness: Marisa Johnson is a 46 year old white female,separated, [id number] in for well woman gyn exam. She is using mounjaro and has lost over 40 lbs in about 6 months. She works for [employer] at home. PCP is Ronny Bacon NP Lab Results  Component Value Date   DIAGPAP  06/27/2020    - Negative for intraepithelial lesion or malignancy (NILM)   HPVHIGH Negative 06/27/2020      Current Medications, Allergies, Past Medical History, Past Surgical History, Family History and Social History were reviewed in [employer] record.     Review of Systems: Patient denies any headaches, hearing loss, fatigue, blurred vision, shortness of breath, chest pain, abdominal pain, problems with bowel movements, urination, or intercourse. No joint pain or mood swings.  No bleeding with megace and no pain with sex, now.   Physical Exam:BP 129/89 (BP Location: Right Arm, Patient Position: Sitting, Cuff Size: Normal)   Pulse 94   Ht 5\' 3"  (1.6 m)   Wt 182 lb (82.6 kg)   BMI 32.24 kg/m   General:  Well developed, well nourished, no acute distress Skin:  Warm and dry,tan Neck:  Midline trachea, normal thyroid, good ROM, no lymphadenopathy Lungs; Clear to auscultation bilaterally Breast:  No dominant palpable mass, retraction, or nipple discharge Cardiovascular: Regular rate and rhythm Abdomen:  Soft, non tender, no hepatosplenomegaly Pelvic:  External genitalia is normal in appearance, no lesions.  The vagina is normal in appearance. Urethra has no lesions or masses. The cervix is bulbous.  Uterus is felt to be normal size, shape, and contour.  No adnexal masses or tenderness noted.Bladder is non tender, no masses felt. Rectal: Good sphincter tone, no polyps, or hemorrhoids felt.  Hemoccult negative. Extremities/musculoskeletal:  No swelling or varicosities noted, no clubbing or cyanosis Psych:  No mood changes,  alert and cooperative,seems happy AA is 2 Fall risk is low     03/13/2022    2:38 PM 06/27/2020    3:31 PM  Depression screen PHQ 2/9  Decreased Interest 1 0  Down, Depressed, Hopeless 1 0  PHQ - 2 Score 2 0  Altered sleeping 1 0  Tired, decreased energy 2 0  Change in appetite 0 0  Feeling bad or failure about yourself  1 0  Trouble concentrating 2 0  Moving slowly or fidgety/restless 0 0  Suicidal thoughts 0 0  PHQ-9 Score 8 0  Difficult doing work/chores  Not difficult at all       03/13/2022    2:38 PM 06/27/2020    3:31 PM  GAD 7 : Generalized Anxiety Score  Nervous, Anxious, on Edge 1 0  Control/stop worrying 1 0  Worry too much - different things 1 0  Trouble relaxing 1 0  Restless 0 0  Easily annoyed or irritable 1 0  Afraid - awful might happen 1 0  Total GAD 7 Score 6 0  Anxiety Difficulty  Not difficult at all    Upstream - 03/13/22 1439       Pregnancy Intention Screening   Does the patient want to become pregnant in the next year? No    Does the patient's partner want to become pregnant in the next year? No    Would the patient like to discuss contraceptive options today? No      Contraception Wrap Up   Current Method Female Sterilization  End Method Female Sterilization    Contraception Counseling Provided No            Examination chaperoned by Celene Squibb LPN    Impression and Plan: 1. Encounter for well woman exam with routine gynecological exam Pap and physical in 1 year Had normal mammogram 03/06/22 She did cologuard at through work about 3 months ago was negative Labs with PCP  2. Encounter for screening fecal occult blood testing Hemoccult was negative    3. Irregular intermenstrual bleeding Has resolved with megace Meds ordered this encounter  Medications   megestrol (MEGACE) 40 MG tablet    Sig: TAKE 1 TABLET(40 MG) BY MOUTH DAILY    Dispense:  30 tablet    Refill:  6    Order Specific Question:   Supervising Provider     Answer:   Tania Ade H [[id number]]

## 2022-03-14 NOTE — Telephone Encounter (Signed)
Paperwork faxed per request.

## 2022-03-20 ENCOUNTER — Telehealth (INDEPENDENT_AMBULATORY_CARE_PROVIDER_SITE_OTHER): Payer: 59 | Admitting: Adult Health

## 2022-03-20 ENCOUNTER — Encounter: Payer: Self-pay | Admitting: Adult Health

## 2022-03-20 ENCOUNTER — Telehealth: Payer: Self-pay

## 2022-03-20 DIAGNOSIS — F908 Attention-deficit hyperactivity disorder, other type: Secondary | ICD-10-CM | POA: Diagnosis not present

## 2022-03-20 DIAGNOSIS — F331 Major depressive disorder, recurrent, moderate: Secondary | ICD-10-CM | POA: Diagnosis not present

## 2022-03-20 DIAGNOSIS — F319 Bipolar disorder, unspecified: Secondary | ICD-10-CM

## 2022-03-20 DIAGNOSIS — F411 Generalized anxiety disorder: Secondary | ICD-10-CM

## 2022-03-20 DIAGNOSIS — F428 Other obsessive-compulsive disorder: Secondary | ICD-10-CM

## 2022-03-20 MED ORDER — AMPHETAMINE-DEXTROAMPHET ER 10 MG PO CP24: 10.0000 mg | ORAL_CAPSULE | Freq: Every day | ORAL | 0 refills | Status: DC

## 2022-03-20 MED ORDER — LAMOTRIGINE 100 MG PO TABS: ORAL_TABLET | ORAL | 5 refills | Status: DC

## 2022-03-20 MED ORDER — BUPROPION HCL ER (XL) 300 MG PO TB24: 300.0000 mg | ORAL_TABLET | Freq: Every evening | ORAL | 5 refills | Status: DC

## 2022-03-20 NOTE — Telephone Encounter (Signed)
Updated pt's FMLA and refaxed per request

## 2022-03-20 NOTE — Progress Notes (Signed)
[name] [id number] 1976-09-25 46 y.o.  Virtual Visit via Video Note  I connected with pt @ on 03/20/22 at  1:00 PM EDT by a video enabled telemedicine application and verified that I am speaking with the correct person using two identifiers.   I discussed the limitations of evaluation and management by telemedicine and the availability of in person appointments. The patient expressed understanding and agreed to proceed.  I discussed the assessment and treatment plan with the patient. The patient was provided an opportunity to ask questions and all were answered. The patient agreed with the plan and demonstrated an understanding of the instructions.   The patient was advised to call back or seek an in-person evaluation if the symptoms worsen or if the condition fails to improve as anticipated.  I provided 25 minutes of non-face-to-face time during this encounter.  The patient was located at home.  The provider was located at [facility].   Dorothyann Gibbs, NP   Subjective:   Patient ID:  Marisa Johnson is a 46 y.o. (DOB [birth year]) female.  Chief Complaint: No chief complaint on file.   HPI Marisa Johnson presents for follow-up of ADHD, MDD, GAD, obsessional thoughts, and BPD.  Referred by PCP - working with TSH regulation.  Patient seen today for medication management.  Describes mood today as "better". Pleasant. Tearful at times. Mood symptoms - reports depression - financial stressors and college. Feels like financial stressors worsen depression. Decreased anxiety and irritability. Decreased worry and rumination. Decreased obsessive thoughts and acts - still having to put her deodorant on a a certain amount of "odd" times. Reports one panic attack - associated with wanting to leave work. Denies mood fluctuations - diagnosed with Bipolar in 2011 - mania. Has FMLA in place as needed. Feels like the changes in medication have been helpful. Has stopped the Lithium and  feels better physically. Stating "I feel more present without it". PCP monitoring her thyroid.  Currently approved for FMLA and hopes to continue while medications are changed. Varying interest and motivation. Taking medications as prescribed. Working with a Paramedic - DBT. Energy levels improved. Active, has been exercising some. Recently joined a gym.  Enjoys some usual interests and activities. Divorced. Dating. Has 3 children in college. Spending time with family. Appetite adequate.  Weight loss with Mounjaro 217 to 179 pounds. Sleeps well most nights. Averages 7 hours with 10mg  of Ambien. Focus and concentration difficulties. Completing tasks. Managing aspects of household. 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 Denies SI or HI.  Denies AH or VH. Consumes alcohol - one drink a week. Denies THC use. History of self harm - clean since February of 2022.  Thyroid is better  Previous medication trials: Adderall, Abilify, Ambien Clonazepam, Wellbutrin, Rexulti, Buspar, Lamictal, Lithium, Adderall XR    Review of Systems:  Review of Systems  Musculoskeletal:  Negative for gait problem.  Neurological:  Negative for tremors.  Psychiatric/Behavioral:         Please refer to HPI   Medications: I have reviewed the patient's current medications.  Current Outpatient Medications  Medication Sig Dispense Refill   AIMOVIG 70 MG/ML SOAJ Inject 1 Syringe into the skin every 30 (thirty) days.     amphetamine-dextroamphetamine (ADDERALL XR) 10 MG 24 hr capsule Take 1 capsule (10 mg total) by mouth daily. 30 capsule 0   brexpiprazole (REXULTI) 2 MG TABS tablet Take 1 tablet (2 mg total) by mouth daily. 30 tablet 2  buPROPion (WELLBUTRIN XL) 150 MG 24 hr tablet Take 1 tablet (150 mg total) by mouth every evening. 30 tablet 2   lamoTRIgine (LAMICTAL) 25 MG tablet Take one tablet daily for 14 days, then increase to two tablets daily. 60 tablet 2   levocetirizine (XYZAL) 5 MG tablet Take 5 mg by  mouth at bedtime.     levothyroxine (SYNTHROID) 100 MCG tablet levothyroxine 137 mcg tablet  TAKE 1 TABLET BY MOUTH EVERY MORNING 30 MINS BEFORE MEALS     megestrol (MEGACE) 40 MG tablet TAKE 1 TABLET(40 MG) BY MOUTH DAILY 30 tablet 6   promethazine (PHENERGAN) 25 MG tablet Take 25 mg by mouth every 6 (six) hours as needed.      tirzepatide (MOUNJARO) 7.5 MG/0.5ML Pen Inject 7.5 mg into the skin once a week.     Ubrogepant (UBRELVY) 100 MG TABS Take by mouth.     zolpidem (AMBIEN) 10 MG tablet Take 10 mg by mouth daily.     No current facility-administered medications for this visit.    Medication Side Effects: None  Allergies:  Allergies  Allergen Reactions   Abilify [Aripiprazole] Other (See Comments)    Reaction:  Restless leg syndrome    Other     Bean Sprouts   Reglan [Metoclopramide] Other (See Comments)    Reaction:  Hallucinations    Triptans Other (See Comments)    Elevated blood pressure    Dexamethasone Rash    Past Medical History:  Diagnosis Date   ADHD    Anxiety    Carpal tunnel syndrome    Chronic fatigue    Connective tissue disorder (HCC)    Contraceptive management 08/09/2015   Decreased libido 08/09/2015   Friable cervix 08/09/2015   History of abnormal cervical Pap smear 08/09/2015   Hypothyroidism    IBS (irritable bowel syndrome)    Irregular intermenstrual bleeding 08/09/2015   Major depressive disorder    Migraine    Mixed connective tissue disease (HCC)    Pseudobulbar affect    PTSD (post-traumatic stress disorder)    Sjogren's syndrome with lung involvement (HCC)    Thyroid disease    Vaginal Pap smear, abnormal     Family History  Problem Relation Age of Onset   Diabetes Mother    Hypertension Mother    Hypothyroidism Mother    Hypertension Father    Stroke Father    Cancer Father        prostate, with metastisis   Cancer Maternal Grandmother        Gaulbladder   Kidney disease Paternal Grandmother    Hypertension Paternal  Grandmother    Hyperlipidemia Paternal Grandmother    Heart disease Paternal Grandmother    Heart attack Paternal Grandmother    Diabetes Daughter    Hypothyroidism Daughter    Raynaud syndrome Daughter    Rheumatologic disease Paternal Grandfather    Anxiety disorder Sister    Alcoholism Sister    Anxiety disorder Brother    Drug abuse Brother    Anxiety disorder Sister    Cancer Maternal Uncle        Lung   Colon cancer Neg Hx     Social History   Socioeconomic History   Marital status: Legally Separated    Spouse name: Not on file   Number of children: 3   Years of education: Not on file   Highest education level: Not on file  Occupational History   Occupation: Designer, jewellery  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    Comment: ED, 12 years  Tobacco Use   Smoking status: Never   Smokeless tobacco: Never   Tobacco comments:    Never smoker  Vaping Use   Vaping Use: Never used  Substance and Sexual Activity   Alcohol use: Yes    Comment: occ.   Drug use: No   Sexual activity: Yes    Birth control/protection: Surgical    Comment: tubal and ablation  Other Topics Concern   Not on file  Social History Narrative   Not on file   Social Determinants of Health   Financial Resource Strain: Low Risk    Difficulty of Paying Living Expenses: Not very hard  Food Insecurity: No Food Insecurity   Worried About Running Out of Food in the Last Year: Never true   Ran Out of Food in the Last Year: Never true  Transportation Needs: No Transportation Needs   Lack of Transportation (Medical): No   Lack of Transportation (Non-Medical): No  Physical Activity: Insufficiently Active   Days of Exercise per Week: 2 days   Minutes of Exercise per Session: 30 min  Stress: Stress Concern Present   Feeling of Stress : To some extent  Social Connections: Moderately Isolated   Frequency of Communication with Friends and Family: Three times a week   Frequency of Social Gatherings with  Friends and Family: Twice a week   Attends Religious Services: 1 to 4 times per year   Active Member of [employer] or Organizations: No   Attends Banker Meetings: Never   Marital Status: Divorced  Catering manager Violence: Not At Risk   Fear of Current or Ex-Partner: No   Emotionally Abused: No   Physically Abused: No   Sexually Abused: No    Past Medical History, Surgical history, Social history, and Family history were reviewed and updated as appropriate.   Please see review of systems for further details on the patient's review from today.   Objective:   Physical Exam:  There were no vitals taken for this visit.  Physical Exam Constitutional:      General: She is not in acute distress. Musculoskeletal:        General: No deformity.  Neurological:     Mental Status: She is alert and oriented to person, place, and time.     Coordination: Coordination normal.  Psychiatric:        Attention and Perception: Attention and perception normal. She does not perceive auditory or visual hallucinations.        Mood and Affect: Mood normal. Mood is not anxious or depressed. Affect is not labile, blunt, angry or inappropriate.        Speech: Speech normal.        Behavior: Behavior normal.        Thought Content: Thought content normal. Thought content is not paranoid or delusional. Thought content does not include homicidal or suicidal ideation. Thought content does not include homicidal or suicidal plan.        Cognition and Memory: Cognition and memory normal.        Judgment: Judgment normal.     Comments: Insight intact    Lab Review:     Component Value Date/Time   NA 137 04/02/2021 1339   K 3.9 04/02/2021 1339   CL 105 04/02/2021 1339   CO2 24 04/02/2021 1339   GLUCOSE 95 04/02/2021 1339   BUN 10 04/02/2021 1339   CREATININE 1.08 (H) 04/02/2021  1339   CALCIUM 9.3 04/02/2021 1339   PROT 8.0 03/31/2020 1217   ALBUMIN 4.6 03/31/2020 1217   AST 28 03/31/2020 1217    ALT 29 03/31/2020 1217   ALKPHOS 84 03/31/2020 1217   BILITOT 0.8 03/31/2020 1217   GFRNONAA >60 04/02/2021 1339   GFRAA >60 03/31/2020 1217       Component Value Date/Time   WBC 6.3 04/02/2021 1339   RBC 4.40 04/02/2021 1339   HGB 14.0 04/02/2021 1339   HGB 13.5 07/25/2017 1238   HCT 42.9 04/02/2021 1339   HCT 42.4 07/25/2017 1238   PLT 313 04/02/2021 1339   PLT 300 07/25/2017 1238   MCV 97.5 04/02/2021 1339   MCV 91 07/25/2017 1238   MCH 31.8 04/02/2021 1339   MCHC 32.6 04/02/2021 1339   RDW 12.6 04/02/2021 1339   RDW 13.2 07/25/2017 1238   LYMPHSABS 1.9 04/02/2021 1339   LYMPHSABS 4.6 (H) 07/25/2017 1238   MONOABS 0.6 04/02/2021 1339   EOSABS 0.1 04/02/2021 1339   EOSABS 0.2 07/25/2017 1238   BASOSABS 0.0 04/02/2021 1339   BASOSABS 0.0 07/25/2017 1238    No results found for: POCLITH, LITHIUM   No results found for: PHENYTOIN, PHENOBARB, VALPROATE, CBMZ   .res Assessment: Plan:    Plan:  PDMP reviewed  Rexulti 2mg  daily. Increase Lamictal 50mg  to 100mg  daily Increase Wellbutrin XL 150mg  to 300mg  daily Adderall 10mg  daily  Time spent with patient was minutes. Greater than 50% of face to face time with patient was spent on counseling and coordination of care.    RTC 4 weeks  Patient advised to contact office with any questions, adverse effects, or acute worsening in signs and symptoms.   There are no diagnoses linked to this encounter.   Please see After Visit Summary for patient specific instructions.  No future appointments.  No orders of the defined types were placed in this encounter.     -------------------------------

## 2022-03-26 ENCOUNTER — Telehealth: Payer: Self-pay | Admitting: Adult Health

## 2022-03-26 NOTE — Telephone Encounter (Signed)
Send to nursing staff please.

## 2022-03-26 NOTE — Telephone Encounter (Signed)
Pt called requesting FMLA paperwork to be amended. Contact Pt for info @ [phone]

## 2022-03-27 NOTE — Telephone Encounter (Signed)
This was already amended last week.  Please see if needs to be refaxed or something new

## 2022-03-28 NOTE — Telephone Encounter (Signed)
Put new info in Traci's box 6/15. Also, advised Traci.

## 2022-04-17 ENCOUNTER — Telehealth (INDEPENDENT_AMBULATORY_CARE_PROVIDER_SITE_OTHER): Payer: 59 | Admitting: Adult Health

## 2022-04-17 ENCOUNTER — Encounter: Payer: Self-pay | Admitting: Adult Health

## 2022-04-17 DIAGNOSIS — F319 Bipolar disorder, unspecified: Secondary | ICD-10-CM | POA: Diagnosis not present

## 2022-04-17 DIAGNOSIS — F331 Major depressive disorder, recurrent, moderate: Secondary | ICD-10-CM

## 2022-04-17 DIAGNOSIS — F428 Other obsessive-compulsive disorder: Secondary | ICD-10-CM

## 2022-04-17 DIAGNOSIS — F411 Generalized anxiety disorder: Secondary | ICD-10-CM

## 2022-04-17 DIAGNOSIS — F908 Attention-deficit hyperactivity disorder, other type: Secondary | ICD-10-CM | POA: Diagnosis not present

## 2022-04-17 MED ORDER — AMPHETAMINE-DEXTROAMPHET ER 20 MG PO CP24
20.0000 mg | ORAL_CAPSULE | Freq: Every day | ORAL | 0 refills | Status: DC
Start: 2022-04-17 — End: 2022-05-15

## 2022-04-17 MED ORDER — LAMOTRIGINE 100 MG PO TABS: ORAL_TABLET | ORAL | 5 refills | Status: DC

## 2022-04-17 MED ORDER — BREXPIPRAZOLE 2 MG PO TABS: 2.0000 mg | ORAL_TABLET | Freq: Every day | ORAL | 2 refills | Status: DC

## 2022-04-17 MED ORDER — BUPROPION HCL ER (XL) 300 MG PO TB24
300.0000 mg | ORAL_TABLET | Freq: Every evening | ORAL | 5 refills | Status: DC
Start: 2022-04-17 — End: 2022-05-15

## 2022-04-17 NOTE — Progress Notes (Signed)
Marisa Johnson [id number] Dec 15, 1975 46 y.o.  Virtual Visit via Video Note  I connected with pt @ on 04/17/22 at 10:40 AM EDT by a video enabled telemedicine application and verified that I am speaking with the correct person using two identifiers.   I discussed the limitations of evaluation and management by telemedicine and the availability of in person appointments. The patient expressed understanding and agreed to proceed.  I discussed the assessment and treatment plan with the patient. The patient was provided an opportunity to ask questions and all were answered. The patient agreed with the plan and demonstrated an understanding of the instructions.   The patient was advised to call back or seek an in-person evaluation if the symptoms worsen or if the condition fails to improve as anticipated.  I provided 25 minutes of non-face-to-face time during this encounter.  The patient was located at home.  The provider was located at [facility].   Dorothyann Gibbs, NP   Subjective:   Patient ID:  Marisa Johnson is a 46 y.o. (DOB 1976-03-27) female.  Chief Complaint: No chief complaint on file.   HPI Marisa Johnson presents for follow-up of ADHD, MDD, GAD, obsessional thoughts, and BPD.  Referred by PCP - working with TSH regulation.  Patient seen today for medication management.  Describes mood today as "not too good". Pleasant. Tearful at times. Mood symptoms - reports depression. Feels anxious at times. Decreased irritability. Reports some worry and rumination. Decreased obsessive thoughts and acts. Denies recent panic attacks. Denies mood fluctuations - more lows - diagnosed with Bipolar in 2011 - mania. Thyroid more regulated - "normal". Does not feel like medications are as helpful. Would like to increase Adderall for attention issues and chronic fatigue syndrome. Varying interest and motivation. Taking medications as prescribed. Working with a Paramedic - DBT. Energy  levels lower. Active, does not have a regular exercise routine. Recently joined a gym.  Enjoys some usual interests and activities. Divorced. Dating. Has 3 children in college - home for the summer. Spending time with family. Appetite adequate.  Weight loss with Mounjaro - 179 pounds. Sleeps well most nights. Averages 11.5 hours with 10mg  of Ambien. Focus and concentration difficulties - starts out ok in the mornings and then struggles in the afternoons. Completing tasks. Managing aspects of household. 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 Denies SI or HI.  Denies AH or VH. Consumes alcohol - one drink a week. Denies THC use. History of self harm - clean since February of 2022.  Currently approved for FMLA for mental health and chronic fatigue.   Thyroid is better  Previous medication trials: Adderall, Abilify, Ambien Clonazepam, Wellbutrin, Rexulti, Buspar, Lamictal, Lithium, Adderall XR    Review of Systems:  Review of Systems  Musculoskeletal:  Negative for gait problem.  Neurological:  Negative for tremors.  Psychiatric/Behavioral:         Please refer to HPI    Medications: I have reviewed the patient's current medications.  Current Outpatient Medications  Medication Sig Dispense Refill   amphetamine-dextroamphetamine (ADDERALL XR) 20 MG 24 hr capsule Take 1 capsule (20 mg total) by mouth daily. 30 capsule 0   AIMOVIG 70 MG/ML SOAJ Inject 1 Syringe into the skin every 30 (thirty) days.     amphetamine-dextroamphetamine (ADDERALL XR) 10 MG 24 hr capsule Take 1 capsule (10 mg total) by mouth daily. 30 capsule 0   brexpiprazole (REXULTI) 2 MG  TABS tablet Take 1 tablet (2 mg total) by mouth daily. 30 tablet 2   buPROPion (WELLBUTRIN XL) 300 MG 24 hr tablet Take 1 tablet (300 mg total) by mouth every evening. 30 tablet 5   lamoTRIgine (LAMICTAL) 100 MG tablet Take one tablet daily. 30 tablet 5   levocetirizine (XYZAL) 5 MG tablet Take 5 mg by  mouth at bedtime.     levothyroxine (SYNTHROID) 100 MCG tablet levothyroxine 137 mcg tablet  TAKE 1 TABLET BY MOUTH EVERY MORNING 30 MINS BEFORE MEALS     megestrol (MEGACE) 40 MG tablet TAKE 1 TABLET(40 MG) BY MOUTH DAILY 30 tablet 6   promethazine (PHENERGAN) 25 MG tablet Take 25 mg by mouth every 6 (six) hours as needed.      tirzepatide (MOUNJARO) 7.5 MG/0.5ML Pen Inject 7.5 mg into the skin once a week.     Ubrogepant (UBRELVY) 100 MG TABS Take by mouth.     zolpidem (AMBIEN) 10 MG tablet Take 10 mg by mouth daily.     No current facility-administered medications for this visit.    Medication Side Effects: None  Allergies:  Allergies  Allergen Reactions   Abilify [Aripiprazole] Other (See Comments)    Reaction:  Restless leg syndrome    Other     Bean Sprouts   Reglan [Metoclopramide] Other (See Comments)    Reaction:  Hallucinations    Triptans Other (See Comments)    Elevated blood pressure    Dexamethasone Rash    Past Medical History:  Diagnosis Date   ADHD    Anxiety    Carpal tunnel syndrome    Chronic fatigue    Connective tissue disorder (HCC)    Contraceptive management 08/09/2015   Decreased libido 08/09/2015   Friable cervix 08/09/2015   History of abnormal cervical Pap smear 08/09/2015   Hypothyroidism    IBS (irritable bowel syndrome)    Irregular intermenstrual bleeding 08/09/2015   Major depressive disorder    Migraine    Mixed connective tissue disease (HCC)    Pseudobulbar affect    PTSD (post-traumatic stress disorder)    Sjogren's syndrome with lung involvement (HCC)    Thyroid disease    Vaginal Pap smear, abnormal     Family History  Problem Relation Age of Onset   Diabetes Mother    Hypertension Mother    Hypothyroidism Mother    Hypertension Father    Stroke Father    Cancer Father        prostate, with metastisis   Cancer Maternal Grandmother        Gaulbladder   Kidney disease Paternal Grandmother    Hypertension Paternal  Grandmother    Hyperlipidemia Paternal Grandmother    Heart disease Paternal Grandmother    Heart attack Paternal Grandmother    Diabetes Daughter    Hypothyroidism Daughter    Raynaud syndrome Daughter    Rheumatologic disease Paternal Grandfather    Anxiety disorder Sister    Alcoholism Sister    Anxiety disorder Brother    Drug abuse Brother    Anxiety disorder Sister    Cancer Maternal Uncle        Lung   Colon cancer Neg Hx     Social History   Socioeconomic History   Marital status: Legally Separated    Spouse name: Not on file   Number of children: 3   Years of education: Not on file   Highest education level: Not on file  Occupational History  Occupation: Copywriter, 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   Drug use: No   Sexual activity: Yes    Birth control/protection: Surgical    Comment: tubal and ablation  Other Topics Concern   Not on file  Social History Narrative   Not on file   Social Determinants of Health   Financial Resource Strain: Low Risk  (03/13/2022)   Overall Financial Resource Strain (CARDIA)    Difficulty of Paying Living Expenses: Not very hard  Food Insecurity: No Food Insecurity (03/13/2022)   Hunger Vital Sign    Worried About Running Out of Food in the Last Year: Never true    Ran Out of Food in the Last Year: Never true  Transportation Needs: No Transportation Needs (03/13/2022)   PRAPARE - Administrator, Civil Service (Medical): No    Lack of Transportation (Non-Medical): No  Physical Activity: Insufficiently Active (03/13/2022)   Exercise Vital Sign    Days of Exercise per Week: 2 days    Minutes of Exercise per Session: 30 min  Stress: Stress Concern Present (03/13/2022)   [employer] of [employer]ational [employer] - Occupational Stress Questionnaire    Feeling of Stress : To some extent  Social Connections: Moderately Isolated (03/13/2022)   Social Connection and Isolation Panel [NHANES]    Frequency of Communication with Friends and Family: Three times a week    Frequency of Social Gatherings with Friends and Family: Twice a week    Attends Religious Services: 1 to 4 times per year    Active Member of [employer] or Organizations: No    Attends Banker Meetings: Never    Marital Status: Divorced  Catering manager Violence: Not At Risk (03/13/2022)   Humiliation, Afraid, Rape, and Kick questionnaire    Fear of Current or Ex-Partner: No    Emotionally Abused: No    Physically Abused: No    Sexually Abused: No    Past Medical History, Surgical history, Social history, and Family history were reviewed and updated as appropriate.   Please see review of systems for further details on the patient's review from today.   Objective:   Physical Exam:  There were no vitals taken for this visit.  Physical Exam Constitutional:      General: She is not in acute distress. Musculoskeletal:        General: No deformity.  Neurological:     Mental Status: She is alert and oriented to person, place, and time.     Coordination: Coordination normal.  Psychiatric:        Attention and Perception: Attention and perception normal. She does not perceive auditory or visual hallucinations.        Mood and Affect: Mood normal. Mood is not anxious or depressed. Affect is not labile, blunt, angry or inappropriate.        Speech: Speech normal.        Behavior: Behavior normal.        Thought Content: Thought content normal. Thought content is not paranoid or delusional. Thought content does not include homicidal or suicidal ideation. Thought content does not include homicidal or suicidal plan.  Cognition and Memory: Cognition and memory normal.        Judgment: Judgment  normal.     Comments: Insight intact     Lab Review:     Component Value Date/Time   NA 137 04/02/2021 1339   K 3.9 04/02/2021 1339   CL 105 04/02/2021 1339   CO2 24 04/02/2021 1339   GLUCOSE 95 04/02/2021 1339   BUN 10 04/02/2021 1339   CREATININE 1.08 (H) 04/02/2021 1339   CALCIUM 9.3 04/02/2021 1339   PROT 8.0 03/31/2020 1217   ALBUMIN 4.6 03/31/2020 1217   AST 28 03/31/2020 1217   ALT 29 03/31/2020 1217   ALKPHOS 84 03/31/2020 1217   BILITOT 0.8 03/31/2020 1217   GFRNONAA >60 04/02/2021 1339   GFRAA >60 03/31/2020 1217       Component Value Date/Time   WBC 6.3 04/02/2021 1339   RBC 4.40 04/02/2021 1339   HGB 14.0 04/02/2021 1339   HGB 13.5 07/25/2017 1238   HCT 42.9 04/02/2021 1339   HCT 42.4 07/25/2017 1238   PLT 313 04/02/2021 1339   PLT 300 07/25/2017 1238   MCV 97.5 04/02/2021 1339   MCV 91 07/25/2017 1238   MCH 31.8 04/02/2021 1339   MCHC 32.6 04/02/2021 1339   RDW 12.6 04/02/2021 1339   RDW 13.2 07/25/2017 1238   LYMPHSABS 1.9 04/02/2021 1339   LYMPHSABS 4.6 (H) 07/25/2017 1238   MONOABS 0.6 04/02/2021 1339   EOSABS 0.1 04/02/2021 1339   EOSABS 0.2 07/25/2017 1238   BASOSABS 0.0 04/02/2021 1339   BASOSABS 0.0 07/25/2017 1238    No results found for: "POCLITH", "LITHIUM"   No results found for: "PHENYTOIN", "PHENOBARB", "VALPROATE", "CBMZ"   .res Assessment: Plan:    Plan:  PDMP reviewed  Rexulti 2mg  daily Lamictal 100mg  daily Wellbutrin XL300mg  daily Adderall XR 20mg  daily  Time spent with patient was 25 minutes. Greater than 50% of face to face time with patient was spent on counseling and coordination of care.    RTC 4 weeks  Patient advised to contact office with any questions, adverse effects, or acute worsening in signs and symptoms.  Diagnoses and all orders for this visit:  Attention deficit hyperactivity disorder (ADHD), other type -     amphetamine-dextroamphetamine (ADDERALL XR) 20 MG 24 hr capsule; Take 1 capsule (20 mg  total) by mouth daily.  Bipolar I disorder (HCC) -     brexpiprazole (REXULTI) 2 MG TABS tablet; Take 1 tablet (2 mg total) by mouth daily. -     lamoTRIgine (LAMICTAL) 100 MG tablet; Take one tablet daily.  Major depressive disorder, recurrent episode, moderate (HCC) -     buPROPion (WELLBUTRIN XL) 300 MG 24 hr tablet; Take 1 tablet (300 mg total) by mouth every evening.  Obsessional thoughts  Generalized anxiety disorder     Please see After Visit Summary for patient specific instructions.  No future appointments.  No orders of the defined types were placed in this encounter.     -------------------------------

## 2022-05-15 ENCOUNTER — Encounter: Payer: Self-pay | Admitting: Adult Health

## 2022-05-15 ENCOUNTER — Telehealth (INDEPENDENT_AMBULATORY_CARE_PROVIDER_SITE_OTHER): Payer: 59 | Admitting: Adult Health

## 2022-05-15 DIAGNOSIS — F428 Other obsessive-compulsive disorder: Secondary | ICD-10-CM | POA: Diagnosis not present

## 2022-05-15 DIAGNOSIS — F908 Attention-deficit hyperactivity disorder, other type: Secondary | ICD-10-CM

## 2022-05-15 DIAGNOSIS — F411 Generalized anxiety disorder: Secondary | ICD-10-CM

## 2022-05-15 DIAGNOSIS — F331 Major depressive disorder, recurrent, moderate: Secondary | ICD-10-CM | POA: Diagnosis not present

## 2022-05-15 DIAGNOSIS — F319 Bipolar disorder, unspecified: Secondary | ICD-10-CM

## 2022-05-15 MED ORDER — BREXPIPRAZOLE 2 MG PO TABS: 2.0000 mg | ORAL_TABLET | Freq: Every day | ORAL | 5 refills | Status: DC

## 2022-05-15 MED ORDER — LAMOTRIGINE 100 MG PO TABS: ORAL_TABLET | ORAL | 5 refills | Status: DC

## 2022-05-15 MED ORDER — BUPROPION HCL ER (XL) 300 MG PO TB24
300.0000 mg | ORAL_TABLET | Freq: Every evening | ORAL | 5 refills | Status: DC
Start: 2022-05-15 — End: 2022-11-05

## 2022-05-15 MED ORDER — AMPHETAMINE-DEXTROAMPHET ER 20 MG PO CP24: 20.0000 mg | ORAL_CAPSULE | Freq: Every day | ORAL | 0 refills | Status: DC

## 2022-05-15 MED ORDER — AMPHETAMINE-DEXTROAMPHET ER 20 MG PO CP24
20.0000 mg | ORAL_CAPSULE | Freq: Every day | ORAL | 0 refills | Status: DC
Start: 2022-05-15 — End: 2022-07-02

## 2022-05-15 NOTE — Progress Notes (Signed)
Marisa Johnson [id number] 1975/10/15 46 y.o.  Virtual Visit via Video Note  I connected with pt @ on 05/15/22 at  5:20 PM EDT by a video enabled telemedicine application and verified that I am speaking with the correct person using two identifiers.   I discussed the limitations of evaluation and management by telemedicine and the availability of in person appointments. The patient expressed understanding and agreed to proceed.  I discussed the assessment and treatment plan with the patient. The patient was provided an opportunity to ask questions and all were answered. The patient agreed with the plan and demonstrated an understanding of the instructions.   The patient was advised to call back or seek an in-person evaluation if the symptoms worsen or if the condition fails to improve as anticipated.  I provided 25 minutes of non-face-to-face time during this encounter.  The patient was located at home.  The provider was located at [facility].   Marisa Gibbs, NP   Subjective:   Patient ID:  Marisa Johnson is a 46 y.o. (DOB 1976/07/07) female.  Chief Complaint: No chief complaint on file.   HPI [name] presents for follow-up of  ADHD, MDD, GAD, obsessional thoughts, and BPD.  Referred by PCP - working with TSH regulation.  Patient seen today for medication management.  Describes mood today as "not too good". Pleasant. Tearful at times. Mood symptoms - denies depression - but has negative thoughts. Feels more anxious overall. Decreased irritability. Reports worry and rumination. Decreased obsessive thoughts and acts. Denies recent panic attacks. Denies mood fluctuations. Diagnosed with Bipolar in 2011 - mania. Having ongoing issues regulating thyroid. Also has chronic fatigue related to Covid. Feels like medications are helpful - more like myself. Varying interest and motivation. Taking medications as prescribed. Working with a Paramedic - DBT. Energy levels lower.  Active, does not have a regular exercise routine. Recently joined a gym, but hasn't gone yet.  Enjoys some usual interests and activities. Divorced. Dating. Has 3 children in college - home for the summer. Spending time with family. Appetite adequate.  Weight loss with Mounjaro - 180 pounds. Sleeps well most nights. Averages 11.5 hours with 10mg  of Ambien. Focus and concentration difficulties - more focused in the mornings. Completing tasks. Managing aspects of household. 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 Denies SI or HI.  Denies AH or VH. Consumes alcohol - 4 beers on the weekends. Denies THC use. History of self harm - clean since February of 2022.  Currently approved for FMLA for mental health and chronic fatigue.   Previous medication trials: Adderall, Abilify, Ambien Clonazepam, Wellbutrin, Rexulti, Buspar, Lamictal, Lithium, Adderall XR      Review of Systems:  Review of Systems  Musculoskeletal:  Negative for gait problem.  Neurological:  Negative for tremors.  Psychiatric/Behavioral:         Please refer to HPI    Medications: I have reviewed the patient's current medications.  Current Outpatient Medications  Medication Sig Dispense Refill   [START ON 06/12/2022] amphetamine-dextroamphetamine (ADDERALL XR) 20 MG 24 hr capsule Take 1 capsule (20 mg total) by mouth daily. 30 capsule 0   [START ON 07/10/2022] amphetamine-dextroamphetamine (ADDERALL XR) 20 MG 24 hr capsule Take 1 capsule (20 mg total) by mouth daily. 30 capsule 0   AIMOVIG 70 MG/ML SOAJ Inject 1 Syringe into the skin every 30 (thirty) days.     amphetamine-dextroamphetamine (ADDERALL XR) 10  MG 24 hr capsule Take 1 capsule (10 mg total) by mouth daily. 30 capsule 0   amphetamine-dextroamphetamine (ADDERALL XR) 20 MG 24 hr capsule Take 1 capsule (20 mg total) by mouth daily. 30 capsule 0   brexpiprazole (REXULTI) 2 MG TABS tablet Take 1 tablet (2 mg total) by mouth daily. 30  tablet 5   buPROPion (WELLBUTRIN XL) 300 MG 24 hr tablet Take 1 tablet (300 mg total) by mouth every evening. 30 tablet 5   lamoTRIgine (LAMICTAL) 100 MG tablet Take one tablet daily. 30 tablet 5   levocetirizine (XYZAL) 5 MG tablet Take 5 mg by mouth at bedtime.     levothyroxine (SYNTHROID) 100 MCG tablet levothyroxine 137 mcg tablet  TAKE 1 TABLET BY MOUTH EVERY MORNING 30 MINS BEFORE MEALS     megestrol (MEGACE) 40 MG tablet TAKE 1 TABLET(40 MG) BY MOUTH DAILY 30 tablet 6   promethazine (PHENERGAN) 25 MG tablet Take 25 mg by mouth every 6 (six) hours as needed.      tirzepatide (MOUNJARO) 7.5 MG/0.5ML Pen Inject 7.5 mg into the skin once a week.     Ubrogepant (UBRELVY) 100 MG TABS Take by mouth.     zolpidem (AMBIEN) 10 MG tablet Take 10 mg by mouth daily.     No current facility-administered medications for this visit.    Medication Side Effects: None  Allergies:  Allergies  Allergen Reactions   Abilify [Aripiprazole] Other (See Comments)    Reaction:  Restless leg syndrome    Other     Bean Sprouts   Reglan [Metoclopramide] Other (See Comments)    Reaction:  Hallucinations    Triptans Other (See Comments)    Elevated blood pressure    Dexamethasone Rash    Past Medical History:  Diagnosis Date   ADHD    Anxiety    Carpal tunnel syndrome    Chronic fatigue    Connective tissue disorder (HCC)    Contraceptive management 08/09/2015   Decreased libido 08/09/2015   Friable cervix 08/09/2015   History of abnormal cervical Pap smear 08/09/2015   Hypothyroidism    IBS (irritable bowel syndrome)    Irregular intermenstrual bleeding 08/09/2015   Major depressive disorder    Migraine    Mixed connective tissue disease (HCC)    Pseudobulbar affect    PTSD (post-traumatic stress disorder)    Sjogren's syndrome with lung involvement (HCC)    Thyroid disease    Vaginal Pap smear, abnormal     Family History  Problem Relation Age of Onset   Diabetes Mother     Hypertension Mother    Hypothyroidism Mother    Hypertension Father    Stroke Father    Cancer Father        prostate, with metastisis   Cancer Maternal Grandmother        Gaulbladder   Kidney disease Paternal Grandmother    Hypertension Paternal Grandmother    Hyperlipidemia Paternal Grandmother    Heart disease Paternal Grandmother    Heart attack Paternal Grandmother    Diabetes Daughter    Hypothyroidism Daughter    Raynaud syndrome Daughter    Rheumatologic disease Paternal Grandfather    Anxiety disorder Sister    Alcoholism Sister    Anxiety disorder Brother    Drug abuse Brother    Anxiety disorder Sister    Cancer Maternal Uncle        Lung   Colon cancer Neg Hx     Social History  Socioeconomic History   Marital status: Legally Separated    Spouse name: Not on file   Number of children: 3   Years of education: Not on file   Highest education level: Not on file  Occupational History   Occupation: Registered Nurse    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    Comment: ED, 12 years  Tobacco Use   Smoking status: Never   Smokeless tobacco: Never   Tobacco comments:    Never smoker  Vaping Use   Vaping Use: Never used  Substance and Sexual Activity   Alcohol use: Yes    Comment: occ.   Drug use: No   Sexual activity: Yes    Birth control/protection: Surgical    Comment: tubal and ablation  Other Topics Concern   Not on file  Social History Narrative   Not on file   Social Determinants of Health   Financial Resource Strain: Low Risk  (03/13/2022)   Overall Financial Resource Strain (CARDIA)    Difficulty of Paying Living Expenses: Not very hard  Food Insecurity: No Food Insecurity (03/13/2022)   Hunger Vital Sign    Worried About Running Out of Food in the Last Year: Never true    Ran Out of Food in the Last Year: Never true  Transportation Needs: No Transportation Needs (03/13/2022)   PRAPARE - Administrator, Civil Service (Medical): No    Lack  of Transportation (Non-Medical): No  Physical Activity: Insufficiently Active (03/13/2022)   Exercise Vital Sign    Days of Exercise per Week: 2 days    Minutes of Exercise per Session: 30 min  Stress: Stress Concern Present (03/13/2022)   [employer] of Occupational Health - Occupational Stress Questionnaire    Feeling of Stress : To some extent  Social Connections: Moderately Isolated (03/13/2022)   Social Connection and Isolation Panel [NHANES]    Frequency of Communication with Friends and Family: Three times a week    Frequency of Social Gatherings with Friends and Family: Twice a week    Attends Religious Services: 1 to 4 times per year    Active Member of [employer] or Organizations: No    Attends Banker Meetings: Never    Marital Status: Divorced  Catering manager Violence: Not At Risk (03/13/2022)   Humiliation, Afraid, Rape, and Kick questionnaire    Fear of Current or Ex-Partner: No    Emotionally Abused: No    Physically Abused: No    Sexually Abused: No    Past Medical History, Surgical history, Social history, and Family history were reviewed and updated as appropriate.   Please see review of systems for further details on the patient's review from today.   Objective:   Physical Exam:  There were no vitals taken for this visit.  Physical Exam Constitutional:      General: She is not in acute distress. Musculoskeletal:        General: No deformity.  Neurological:     Mental Status: She is alert and oriented to person, place, and time.     Coordination: Coordination normal.  Psychiatric:        Attention and Perception: Attention and perception normal. She does not perceive auditory or visual hallucinations.        Mood and Affect: Mood normal. Mood is not anxious or depressed. Affect is not labile, blunt, angry or inappropriate.        Speech: Speech normal.        Behavior: Behavior  normal.        Thought Content: Thought content normal. Thought  content is not paranoid or delusional. Thought content does not include homicidal or suicidal ideation. Thought content does not include homicidal or suicidal plan.        Cognition and Memory: Cognition and memory normal.        Judgment: Judgment normal.     Comments: Insight intact     Lab Review:     Component Value Date/Time   NA 137 04/02/2021 1339   K 3.9 04/02/2021 1339   CL 105 04/02/2021 1339   CO2 24 04/02/2021 1339   GLUCOSE 95 04/02/2021 1339   BUN 10 04/02/2021 1339   CREATININE 1.08 (H) 04/02/2021 1339   CALCIUM 9.3 04/02/2021 1339   PROT 8.0 03/31/2020 1217   ALBUMIN 4.6 03/31/2020 1217   AST 28 03/31/2020 1217   ALT 29 03/31/2020 1217   ALKPHOS 84 03/31/2020 1217   BILITOT 0.8 03/31/2020 1217   GFRNONAA >60 04/02/2021 1339   GFRAA >60 03/31/2020 1217       Component Value Date/Time   WBC 6.3 04/02/2021 1339   RBC 4.40 04/02/2021 1339   HGB 14.0 04/02/2021 1339   HGB 13.5 07/25/2017 1238   HCT 42.9 04/02/2021 1339   HCT 42.4 07/25/2017 1238   PLT 313 04/02/2021 1339   PLT 300 07/25/2017 1238   MCV 97.5 04/02/2021 1339   MCV 91 07/25/2017 1238   MCH 31.8 04/02/2021 1339   MCHC 32.6 04/02/2021 1339   RDW 12.6 04/02/2021 1339   RDW 13.2 07/25/2017 1238   LYMPHSABS 1.9 04/02/2021 1339   LYMPHSABS 4.6 (H) 07/25/2017 1238   MONOABS 0.6 04/02/2021 1339   EOSABS 0.1 04/02/2021 1339   EOSABS 0.2 07/25/2017 1238   BASOSABS 0.0 04/02/2021 1339   BASOSABS 0.0 07/25/2017 1238    No results found for: "POCLITH", "LITHIUM"   No results found for: "PHENYTOIN", "PHENOBARB", "VALPROATE", "CBMZ"   .res Assessment: Plan:    Plan:  PDMP reviewed  Rexulti 2mg  daily Lamictal 100mg  daily Wellbutrin XL300mg  daily Adderall XR 20mg  daily  Time spent with patient was 25 minutes. Greater than 50% of face to face time with patient was spent on counseling and coordination of care.    RTC 6 weeks  Patient advised to contact office with any questions, adverse  effects, or acute worsening in signs and symptoms.  Discussed potential metabolic side effects associated with atypical antipsychotics, as well as potential risk for movement side effects. Advised pt to contact office if movement side effects occur.    Discussed potential benefits, risks, and side effects of stimulants with patient to include increased heart rate, palpitations, insomnia, increased anxiety, increased irritability, or decreased appetite.  Instructed patient to contact office if experiencing any significant tolerability issues.    Diagnoses and all orders for this visit:  Attention deficit hyperactivity disorder (ADHD), other type -     amphetamine-dextroamphetamine (ADDERALL XR) 20 MG 24 hr capsule; Take 1 capsule (20 mg total) by mouth daily. -     amphetamine-dextroamphetamine (ADDERALL XR) 20 MG 24 hr capsule; Take 1 capsule (20 mg total) by mouth daily. -     amphetamine-dextroamphetamine (ADDERALL XR) 20 MG 24 hr capsule; Take 1 capsule (20 mg total) by mouth daily.  Bipolar I disorder (HCC) -     lamoTRIgine (LAMICTAL) 100 MG tablet; Take one tablet daily. -     brexpiprazole (REXULTI) 2 MG TABS tablet; Take 1 tablet (2 mg  total) by mouth daily.  Major depressive disorder, recurrent episode, moderate (HCC) -     buPROPion (WELLBUTRIN XL) 300 MG 24 hr tablet; Take 1 tablet (300 mg total) by mouth every evening.  Obsessional thoughts  Generalized anxiety disorder     Please see After Visit Summary for patient specific instructions.  No future appointments.  No orders of the defined types were placed in this encounter.     -------------------------------

## 2022-05-17 ENCOUNTER — Telehealth: Payer: Self-pay | Admitting: Adult Health

## 2022-05-17 NOTE — Telephone Encounter (Signed)
During phone call with pt to schedule follow up appt, she asked about some changes that were to be done to her FMLA paperwork.  I read the note in the encounter about amending the [facility] paperwork but not sure what the amendment was.  Pls call her back after 3pm on [phone] to discuss if the latest FMLA paperwork was reflective of what she needed done.  Next appt 9/19

## 2022-05-29 IMAGING — CT CT HEAD CODE STROKE
3 series · 14 of 47 positions shown, 16 images · non-contrast
Comparison: CT head without contrast 03/11/2020. MR head without
contrast 07/10/2016

CLINICAL DATA: Code stroke. Right-sided visual changes and facial
tingling yesterday. Today numbness in the right lower extremity.

EXAM:
CT HEAD WITHOUT CONTRAST
TECHNIQUE: Contiguous axial images were obtained from the base of the skull
through the vertex without intravenous contrast.

[Series 2: head w o · axial · 0.47mm/px · z∈[+138,+268]mm · 8 of 32 slices shown, 10 images]
[im 3/32  brain]
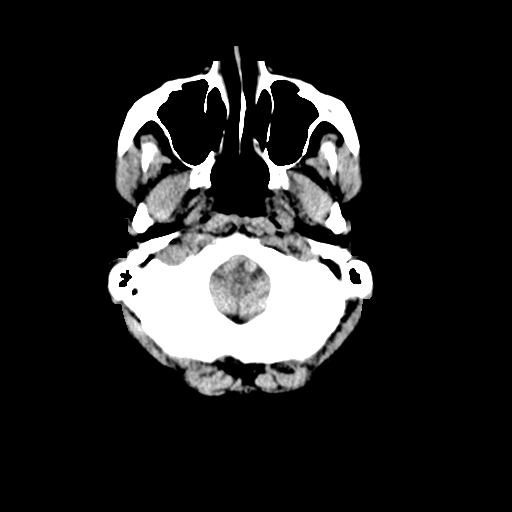
[im 3/32  bone]
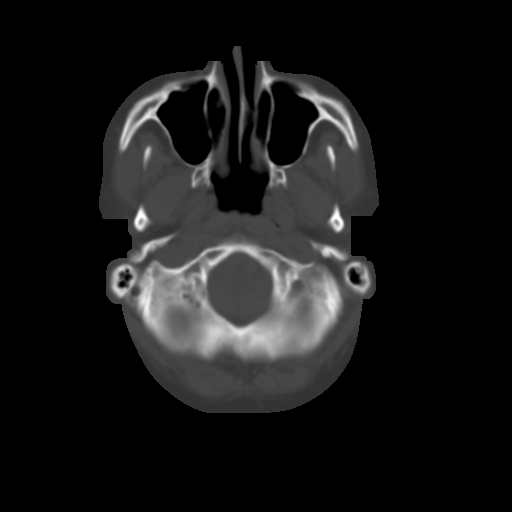
[im 7/32  brain]
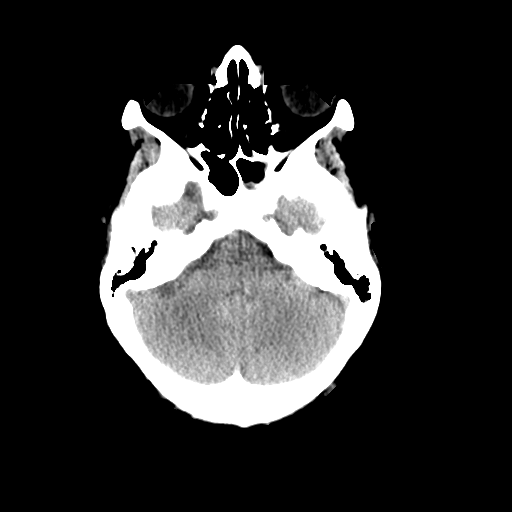
[im 10/32  brain]
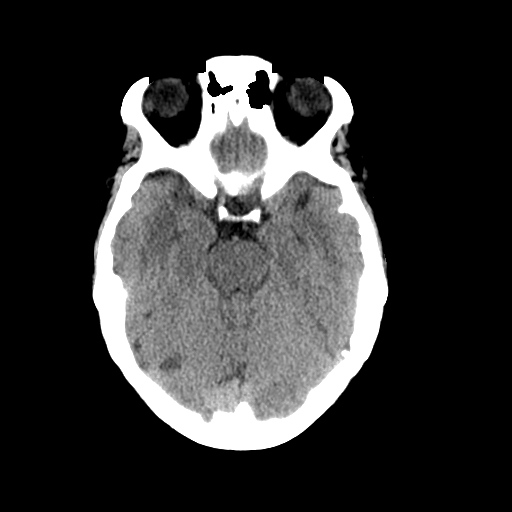
[im 14/32  brain]
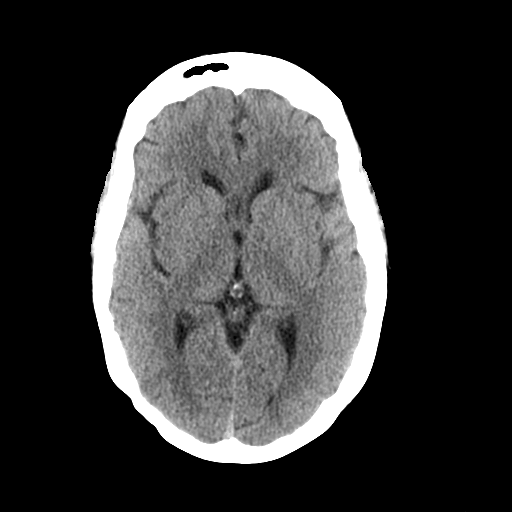
[im 18/32  brain]
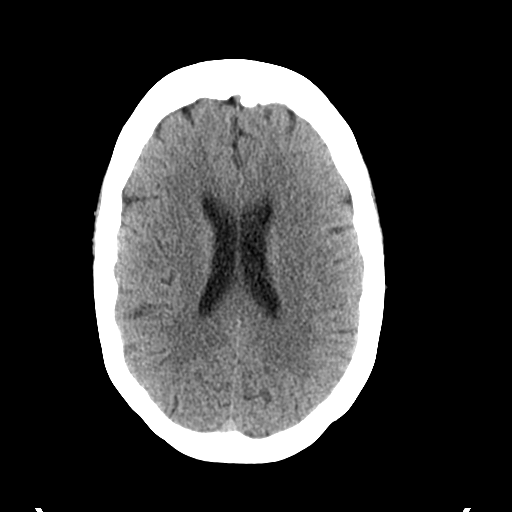
[im 18/32  bone]
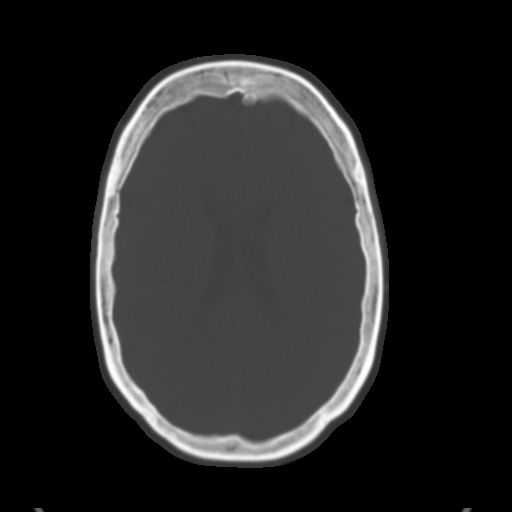
[im 22/32  brain]
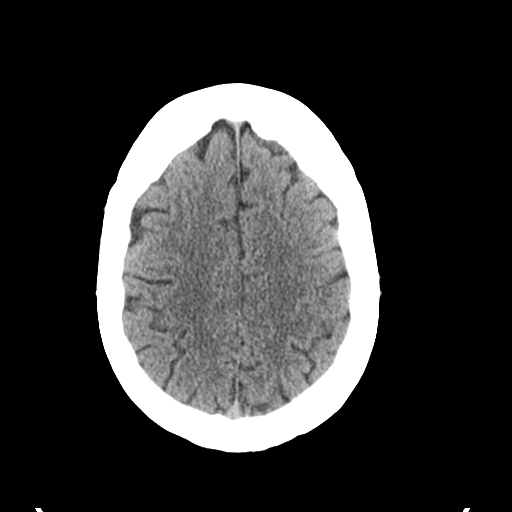
[im 25/32  brain]
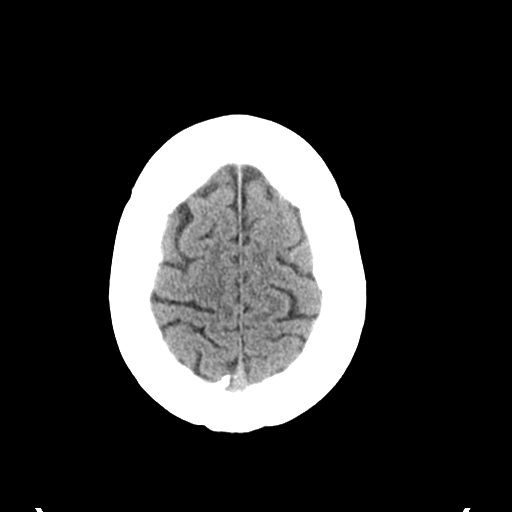
[im 29/32  brain]
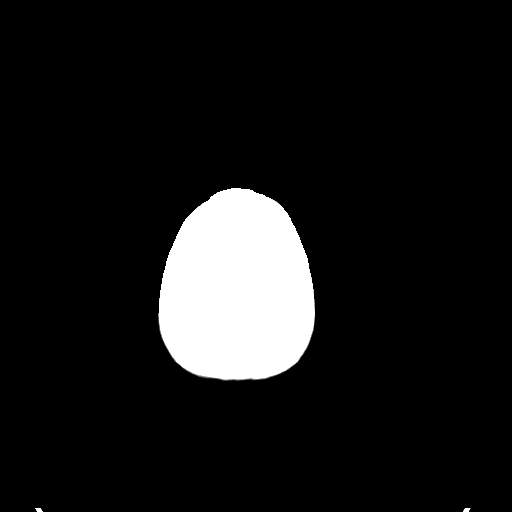

[Series 4: coronal soft · coronal · 0.29mm/px · 3 of 69 slices shown]
[im 23/69  brain]
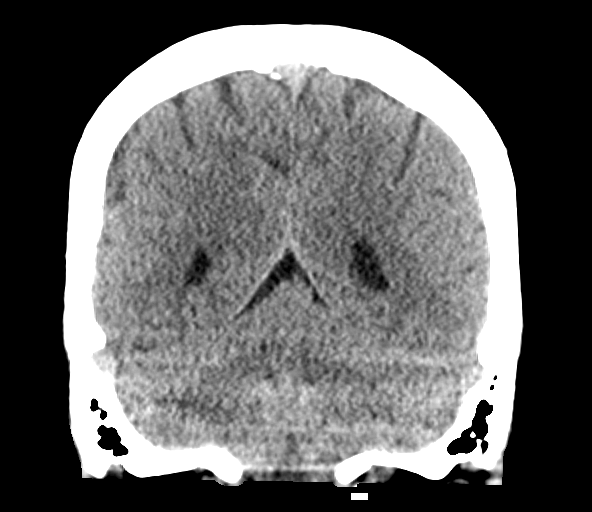
[im 31/69  brain]
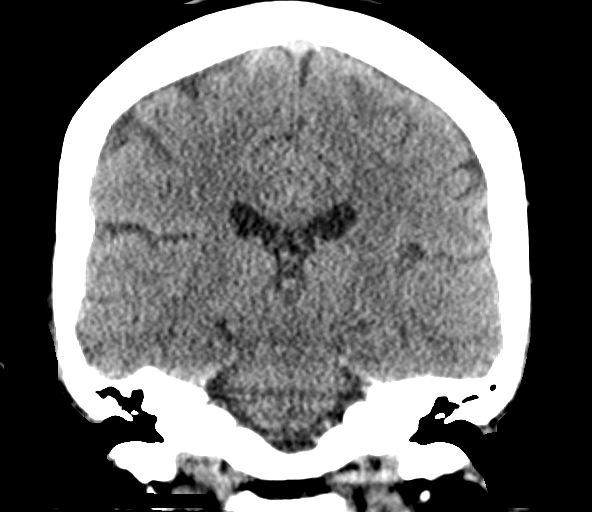
[im 38/69  brain]
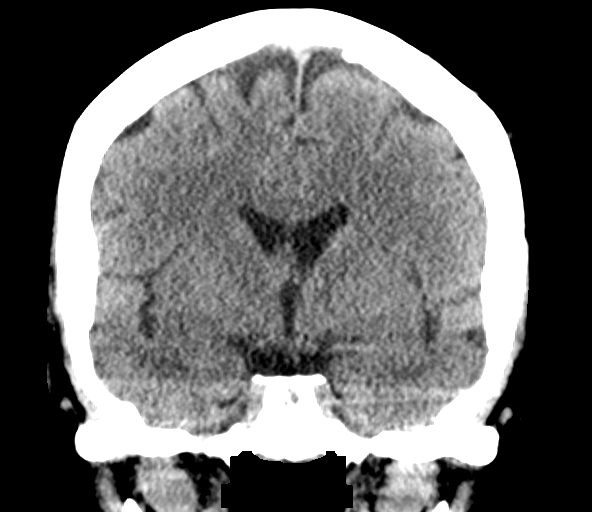

[Series 5: sagittal soft · sagittal · 0.29mm/px · 3 of 58 slices shown]
[im 20/58  brain]
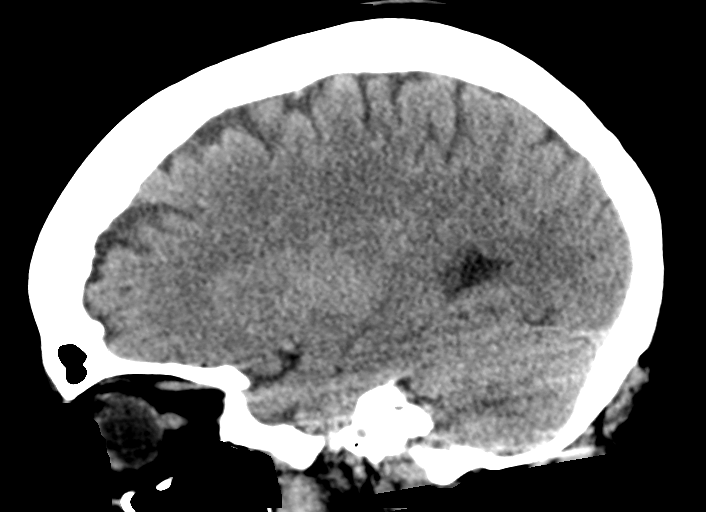
[im 29/58  brain]
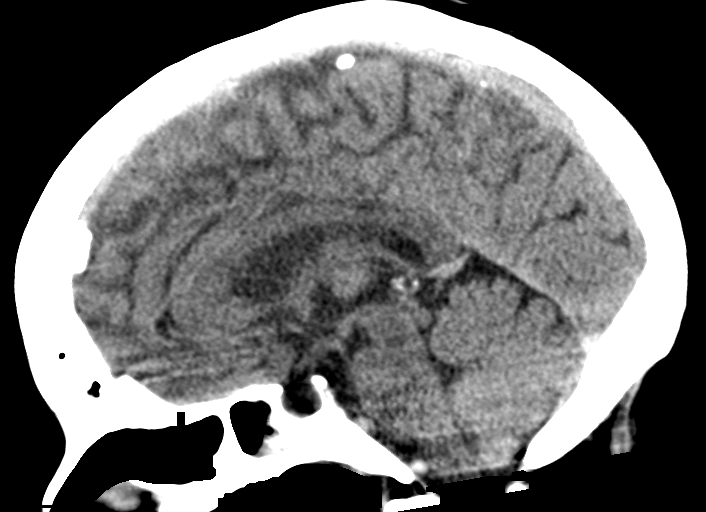
[im 39/58  brain]
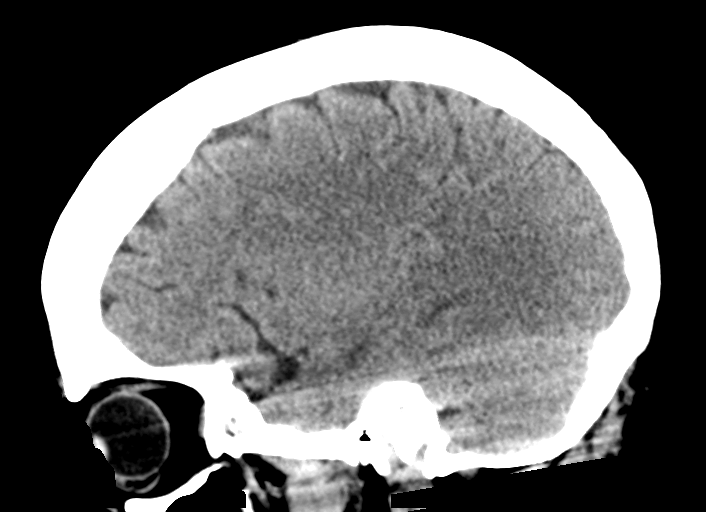

[14 of 47 positions shown; findings below may reference images not displayed]

FINDINGS: Brain: No acute infarct, hemorrhage, or mass lesion is present. The
ventricles are of normal size. No significant white matter lesions
are present. No significant extraaxial fluid collection is present.
The brainstem and cerebellum are within normal limits.

Vascular: No hyperdense vessel or unexpected calcification.

Skull: Calvarium is intact. No focal lytic or blastic lesions are
present. No significant extracranial soft tissue lesion is present.

Sinuses/Orbits: The paranasal sinuses and mastoid air cells are
clear. Scratched at chronic left sphenoid sinus disease is again
noted. The paranasal sinuses and mastoid air cells are otherwise
clear. The globes and orbits are within normal limits.

ASPECTS (Alberta Stroke Program Early CT Score)

- Ganglionic level infarction (caudate, lentiform nuclei, internal
capsule, insula, M1-M3 cortex): [DATE]

- Supraganglionic infarction (M4-M6 cortex): [DATE]

Total score (0-10 with 10 being normal): /10
IMPRESSION: 1. Normal CT appearance of the brain.
2. ASPECTS is [DATE].
3. Chronic left sphenoid sinus disease.

These results were called by telephone at the time of interpretation
on 03/31/2020 at [DATE] to provider CHAI TIGER , who verbally
acknowledged these results.

## 2022-07-02 ENCOUNTER — Encounter: Payer: Self-pay | Admitting: Adult Health

## 2022-07-02 ENCOUNTER — Telehealth (INDEPENDENT_AMBULATORY_CARE_PROVIDER_SITE_OTHER): Payer: 59 | Admitting: Adult Health

## 2022-07-02 DIAGNOSIS — F331 Major depressive disorder, recurrent, moderate: Secondary | ICD-10-CM

## 2022-07-02 DIAGNOSIS — F319 Bipolar disorder, unspecified: Secondary | ICD-10-CM | POA: Diagnosis not present

## 2022-07-02 DIAGNOSIS — F908 Attention-deficit hyperactivity disorder, other type: Secondary | ICD-10-CM

## 2022-07-02 DIAGNOSIS — F428 Other obsessive-compulsive disorder: Secondary | ICD-10-CM

## 2022-07-02 DIAGNOSIS — F411 Generalized anxiety disorder: Secondary | ICD-10-CM

## 2022-07-02 MED ORDER — AMPHETAMINE-DEXTROAMPHET ER 30 MG PO CP24: 30.0000 mg | ORAL_CAPSULE | Freq: Every day | ORAL | 0 refills | Status: DC

## 2022-07-02 NOTE — Progress Notes (Signed)
[name] [id number] 1976-03-22 46 y.o.  Virtual Visit via Video Note  I connected with pt @ on 07/02/22 at 10:00 AM EDT by a video enabled telemedicine application and verified that I am speaking with the correct person using two identifiers.   I discussed the limitations of evaluation and management by telemedicine and the availability of in person appointments. The patient expressed understanding and agreed to proceed.  I discussed the assessment and treatment plan with the patient. The patient was provided an opportunity to ask questions and all were answered. The patient agreed with the plan and demonstrated an understanding of the instructions.   The patient was advised to call back or seek an in-person evaluation if the symptoms worsen or if the condition fails to improve as anticipated.  I provided 25 minutes of non-face-to-face time during this encounter.  The patient was located at home.  The provider was located at [place].   Aloha Gell, NP   Subjective:   Patient ID:  Marisa Johnson is a 46 y.o. (DOB 08-02-1976) female.  Chief Complaint: No chief complaint on file.   HPI Marisa Johnson presents for follow-up of  ADHD, MDD, GAD, obsessional thoughts, and BPD.  Referred by PCP - working with TSH regulation.  Patient seen today for medication management.  Describes mood today as "better". Pleasant. Tearful at times. Mood symptoms - denies depression - deceased negative thoughts. Decreased anxiety - thyroid medication changed. Decreased irritability. Reports worry and rumination. Decreased obsessive thoughts and acts. Denies recent panic attacks. Denies mood fluctuations. Stating "I'm feeling pretty good". Diagnosed with Bipolar in 2011 - mania. Working on thyroid regulation. Also has chronic fatigue related to Covid. Feels like medications are working well for her. Improved interest and motivation. Taking medications as prescribed. Working with a  Transport planner - [place]. Energy levels lower. Active, does not have a regular exercise routine.  Enjoys some usual interests and activities. Divorced. Dating. Has 3 grown children. Spending time with family. Appetite adequate.  Weight loss with Mounjaro - 178 from 180 pounds. Sleeps well most nights. Averages 10 hours with 10mg  of Ambien. Focus and concentration has improved - Adderall XR 20mg  wearing off before her day is over. Completing tasks. Managing aspects of household. 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 Denies SI or HI.  Denies AH or VH. Consumes alcohol - 4 beers on the weekends. Denies THC use. History of self harm - clean since February of 2022.  Currently approved for FMLA for mental health and chronic fatigue.   Previous medication trials: Adderall, Abilify, Ambien Clonazepam, Wellbutrin, Rexulti, Buspar, Lamictal, Lithium, Adderall XR    Review of Systems:  Review of Systems  Musculoskeletal:  Negative for gait problem.  Neurological:  Negative for tremors.  Psychiatric/Behavioral:         Please refer to HPI    Medications: I have reviewed the patient's current medications.  Current Outpatient Medications  Medication Sig Dispense Refill   AIMOVIG 70 MG/ML SOAJ Inject 1 Syringe into the skin every 30 (thirty) days.     amphetamine-dextroamphetamine (ADDERALL XR) 10 MG 24 hr capsule Take 1 capsule (10 mg total) by mouth daily. 30 capsule 0   amphetamine-dextroamphetamine (ADDERALL XR) 20 MG 24 hr capsule Take 1 capsule (20 mg total) by mouth daily. 30 capsule 0   amphetamine-dextroamphetamine (ADDERALL XR) 20 MG 24 hr capsule Take 1 capsule (20 mg total) by mouth daily.  30 capsule 0   [START ON 07/10/2022] amphetamine-dextroamphetamine (ADDERALL XR) 20 MG 24 hr capsule Take 1 capsule (20 mg total) by mouth daily. 30 capsule 0   brexpiprazole (REXULTI) 2 MG TABS tablet Take 1 tablet (2 mg total) by mouth daily. 30 tablet 5   buPROPion  (WELLBUTRIN XL) 300 MG 24 hr tablet Take 1 tablet (300 mg total) by mouth every evening. 30 tablet 5   lamoTRIgine (LAMICTAL) 100 MG tablet Take one tablet daily. 30 tablet 5   levocetirizine (XYZAL) 5 MG tablet Take 5 mg by mouth at bedtime.     levothyroxine (SYNTHROID) 100 MCG tablet levothyroxine 137 mcg tablet  TAKE 1 TABLET BY MOUTH EVERY MORNING 30 MINS BEFORE MEALS     megestrol (MEGACE) 40 MG tablet TAKE 1 TABLET(40 MG) BY MOUTH DAILY 30 tablet 6   promethazine (PHENERGAN) 25 MG tablet Take 25 mg by mouth every 6 (six) hours as needed.      tirzepatide (MOUNJARO) 7.5 MG/0.5ML Pen Inject 7.5 mg into the skin once a week.     Ubrogepant (UBRELVY) 100 MG TABS Take by mouth.     zolpidem (AMBIEN) 10 MG tablet Take 10 mg by mouth daily.     No current facility-administered medications for this visit.    Medication Side Effects: None  Allergies:  Allergies  Allergen Reactions   Abilify [Aripiprazole] Other (See Comments)    Reaction:  Restless leg syndrome    Other     Bean Sprouts   Reglan [Metoclopramide] Other (See Comments)    Reaction:  Hallucinations    Triptans Other (See Comments)    Elevated blood pressure    Dexamethasone Rash    Past Medical History:  Diagnosis Date   ADHD    Anxiety    Carpal tunnel syndrome    Chronic fatigue    Connective tissue disorder ([place])    Contraceptive management 08/09/2015   Decreased libido 08/09/2015   Friable cervix 08/09/2015   History of abnormal cervical Pap smear 08/09/2015   Hypothyroidism    IBS (irritable bowel syndrome)    Irregular intermenstrual bleeding 08/09/2015   Major depressive disorder    Migraine    Mixed connective tissue disease (HCC)    Pseudobulbar affect    PTSD (post-traumatic stress disorder)    Sjogren's syndrome with lung involvement ([place])    Thyroid disease    Vaginal Pap smear, abnormal     Family History  Problem Relation Age of Onset   Diabetes Mother    Hypertension Mother     Hypothyroidism Mother    Hypertension Father    Stroke Father    Cancer Father        prostate, with metastisis   Cancer Maternal Grandmother        Gaulbladder   Kidney disease Paternal Grandmother    Hypertension Paternal Grandmother    Hyperlipidemia Paternal Grandmother    Heart disease Paternal Grandmother    Heart attack Paternal Grandmother    Diabetes Daughter    Hypothyroidism Daughter    Raynaud syndrome Daughter    Rheumatologic disease Paternal Grandfather    Anxiety disorder Sister    Alcoholism Sister    Anxiety disorder Brother    Drug abuse Brother    Anxiety disorder Sister    Cancer Maternal Uncle        Lung   Colon cancer Neg Hx     Social History   Socioeconomic History   Marital status: Legally Separated  Spouse name: Not on file   Number of children: 3   Years of education: Not on file   Highest education level: Not on file  Occupational History   Occupation: Registered Nurse    Employer: [employer]: [employer], 12 years  Tobacco Use   Smoking status: Never   Smokeless tobacco: Never   Tobacco comments:    Never smoker  Vaping Use   Vaping Use: Never used  Substance and Sexual Activity   Alcohol use: Yes    Comment: occ.   Drug use: No   Sexual activity: Yes    Birth control/protection: Surgical    Comment: tubal and ablation  Other Topics Concern   Not on file  Social History Narrative   Not on file   Social Determinants of Health   Financial Resource Strain: Low Risk  (03/13/2022)   Overall Financial Resource Strain (CARDIA)    Difficulty of Paying Living Expenses: Not very hard  Food Insecurity: No Food Insecurity (03/13/2022)   Hunger Vital Sign    Worried About Running Out of Food in the Last Year: Never true    Ran Out of Food in the Last Year: Never true  Transportation Needs: No Transportation Needs (03/13/2022)   PRAPARE - Hydrologist (Medical): No    Lack of Transportation  (Non-Medical): No  Physical Activity: Insufficiently Active (03/13/2022)   Exercise Vital Sign    Days of Exercise per Week: 2 days    Minutes of Exercise per Session: 30 min  Stress: Stress Concern Present (03/13/2022)   [place]    Feeling of Stress : To some extent  Social Connections: Moderately Isolated (03/13/2022)   Social Connection and Isolation Panel [NHANES]    Frequency of Communication with Friends and Family: Three times a week    Frequency of Social Gatherings with Friends and Family: Twice a week    Attends Religious Services: 1 to 4 times per year    Active Member of [employer] or Organizations: No    Attends Archivist Meetings: Never    Marital Status: Divorced  Human resources officer Violence: Not At Risk (03/13/2022)   Humiliation, Afraid, Rape, and Kick questionnaire    Fear of Current or Ex-Partner: No    Emotionally Abused: No    Physically Abused: No    Sexually Abused: No    Past Medical History, Surgical history, Social history, and Family history were reviewed and updated as appropriate.   Please see review of systems for further details on the patient's review from today.   Objective:   Physical Exam:  There were no vitals taken for this visit.  Physical Exam Constitutional:      General: She is not in acute distress. Musculoskeletal:        General: No deformity.  Neurological:     Mental Status: She is alert and oriented to person, place, and time.     Coordination: Coordination normal.  Psychiatric:        Attention and Perception: Attention and perception normal. She does not perceive auditory or visual hallucinations.        Mood and Affect: Mood normal. Mood is not anxious or depressed. Affect is not labile, blunt, angry or inappropriate.        Speech: Speech normal.        Behavior: Behavior normal.        Thought Content: Thought  content normal. Thought content is not  paranoid or delusional. Thought content does not include homicidal or suicidal ideation. Thought content does not include homicidal or suicidal plan.        Cognition and Memory: Cognition and memory normal.        Judgment: Judgment normal.     Comments: Insight intact     Lab Review:     Component Value Date/Time   NA 137 04/02/2021 1339   K 3.9 04/02/2021 1339   CL 105 04/02/2021 1339   CO2 24 04/02/2021 1339   GLUCOSE 95 04/02/2021 1339   BUN 10 04/02/2021 1339   CREATININE 1.08 (H) 04/02/2021 1339   CALCIUM 9.3 04/02/2021 1339   PROT 8.0 03/31/2020 1217   ALBUMIN 4.6 03/31/2020 1217   AST 28 03/31/2020 1217   ALT 29 03/31/2020 1217   ALKPHOS 84 03/31/2020 1217   BILITOT 0.8 03/31/2020 1217   GFRNONAA >60 04/02/2021 1339   GFRAA >60 03/31/2020 1217       Component Value Date/Time   WBC 6.3 04/02/2021 1339   RBC 4.40 04/02/2021 1339   HGB 14.0 04/02/2021 1339   HGB 13.5 07/25/2017 1238   HCT 42.9 04/02/2021 1339   HCT 42.4 07/25/2017 1238   PLT 313 04/02/2021 1339   PLT 300 07/25/2017 1238   MCV 97.5 04/02/2021 1339   MCV 91 07/25/2017 1238   MCH 31.8 04/02/2021 1339   MCHC 32.6 04/02/2021 1339   RDW 12.6 04/02/2021 1339   RDW 13.2 07/25/2017 1238   LYMPHSABS 1.9 04/02/2021 1339   LYMPHSABS 4.6 (H) 07/25/2017 1238   MONOABS 0.6 04/02/2021 1339   EOSABS 0.1 04/02/2021 1339   EOSABS 0.2 07/25/2017 1238   BASOSABS 0.0 04/02/2021 1339   BASOSABS 0.0 07/25/2017 1238    No results found for: "POCLITH", "LITHIUM"   No results found for: "PHENYTOIN", "PHENOBARB", "VALPROATE", "CBMZ"   .res Assessment: Plan:    Plan:  PDMP reviewed  Rexulti 2mg  daily Lamictal 100mg  daily Wellbutrin XL300mg  daily Adderall XR 20mg  to 30mg  daily  Monitor BP between visits.   Time spent with patient was 25 minutes. Greater than 50% of face to face time with patient was spent on counseling and coordination of care.    RTC 6 weeks  Patient advised to contact office with  any questions, adverse effects, or acute worsening in signs and symptoms.  Discussed potential metabolic side effects associated with atypical antipsychotics, as well as potential risk for movement side effects. Advised pt to contact office if movement side effects occur.    Discussed potential benefits, risks, and side effects of stimulants with patient to include increased heart rate, palpitations, insomnia, increased anxiety, increased irritability, or decreased appetite.  Instructed patient to contact office if experiencing any significant tolerability issues.   Diagnoses and all orders for this visit:  Bipolar I disorder ([place])  Major depressive disorder, recurrent episode, moderate ([place])  Obsessional thoughts  Generalized anxiety disorder  Attention deficit hyperactivity disorder (ADHD), other type     Please see After Visit Summary for patient specific instructions.  No future appointments.  No orders of the defined types were placed in this encounter.     -------------------------------

## 2022-07-03 ENCOUNTER — Telehealth: Payer: Self-pay | Admitting: Adult Health

## 2022-07-03 ENCOUNTER — Other Ambulatory Visit: Payer: Self-pay

## 2022-07-03 MED ORDER — AMPHETAMINE-DEXTROAMPHET ER 30 MG PO CP24
30.0000 mg | ORAL_CAPSULE | Freq: Every day | ORAL | 0 refills | Status: DC
Start: 2022-07-03 — End: 2022-08-05

## 2022-07-03 NOTE — Telephone Encounter (Signed)
Pt Lvm at 2:40p.  She needs Adderall XR 30mg  sent to [facility] on [address] in [place]. They have the full script there, (only 9 pills at the other pharmacy where script was sent originally).  Next appt 11/1

## 2022-07-03 NOTE — Telephone Encounter (Signed)
Pended.

## 2022-07-07 ENCOUNTER — Encounter: Payer: Self-pay | Admitting: Emergency Medicine

## 2022-07-07 ENCOUNTER — Other Ambulatory Visit: Payer: Self-pay

## 2022-07-07 ENCOUNTER — Ambulatory Visit
Admission: EM | Admit: 2022-07-07 | Discharge: 2022-07-07 | Disposition: A | Discharge status: Home / Self Care | Payer: 59 | Attending: Physician Assistant | Admitting: Physician Assistant

## 2022-07-07 DIAGNOSIS — N3000 Acute cystitis without hematuria: Secondary | ICD-10-CM | POA: Diagnosis not present

## 2022-07-07 LAB — POCT URINALYSIS DIP (MANUAL ENTRY)
Glucose, UA: NEGATIVE mg/dL
Ketones, POC UA: NEGATIVE mg/dL
Nitrite, UA: NEGATIVE
Protein Ur, POC: 100 mg/dL — AB
Spec Grav, UA: >=1.03 — AB (ref 1.010–1.025)
Urobilinogen, UA: 2 E.U./dL — AB
pH, UA: 6 (ref 5.0–8.0)

## 2022-07-07 MED ORDER — NITROFURANTOIN MONOHYD MACRO 100 MG PO CAPS: 100.0000 mg | ORAL_CAPSULE | Freq: Two times a day (BID) | ORAL | 0 refills | Status: DC

## 2022-07-07 MED ORDER — CIPROFLOXACIN HCL 500 MG PO TABS: 500.0000 mg | ORAL_TABLET | Freq: Two times a day (BID) | ORAL | 0 refills | Status: DC

## 2022-07-07 MED ORDER — PHENAZOPYRIDINE HCL 100 MG PO TABS: 100.0000 mg | ORAL_TABLET | Freq: Three times a day (TID) | ORAL | 0 refills | Status: DC | PRN

## 2022-07-07 NOTE — ED Provider Notes (Signed)
[facility]    CSN: [id number] Arrival date & time: 07/07/22  1120      History   Chief Complaint Chief Complaint  Patient presents with   Urinary Tract Infection    HPI Marisa Johnson is a 46 y.o. female.   Patient here today for evaluation of possible UTI.  She reports that last night she started having bladder pressure as well as urgency frequency and lower back pain that started today.  She has not taken any medication for symptoms.  She does have history of UTIs.  She denies any nausea, vomiting.  She has not had fever.  The history is provided by the patient.    Past Medical History:  Diagnosis Date   ADHD    Anxiety    Carpal tunnel syndrome    Chronic fatigue    Connective tissue disorder (HCC)    Contraceptive management 08/09/2015   Decreased libido 08/09/2015   Friable cervix 08/09/2015   History of abnormal cervical Pap smear 08/09/2015   Hypothyroidism    IBS (irritable bowel syndrome)    Irregular intermenstrual bleeding 08/09/2015   Major depressive disorder    Migraine    Mixed connective tissue disease (HCC)    Pseudobulbar affect    PTSD (post-traumatic stress disorder)    Sjogren's syndrome with lung involvement (HCC)    Thyroid disease    Vaginal Pap smear, abnormal     Patient Active Problem List   Diagnosis Date Noted   Encounter for well woman exam with routine gynecological exam 03/13/2022   Encounter for screening fecal occult blood testing 06/27/2020   Encounter for gynecological examination with Papanicolaou smear of cervix 06/27/2020   Chronic migraine without aura, with status migrainosus 03/14/2020   Closed fracture of lower end of left radius with routine healing 12/16/2019   Routine screening for STI (sexually transmitted infection) 10/06/2019   Cervicitis and endocervicitis 02/11/2018   History of abortion 07/25/2017   Endometritis 07/25/2017   Pelvic pain 07/25/2017   Dyspnea 05/03/2017   SOB (shortness of  breath) 05/03/2017   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   Sjogren's syndrome with lung involvement (HCC) 03/15/2017   Irregular intermenstrual bleeding 08/09/2015   Decreased libido 08/09/2015   Friable cervix 08/09/2015   Contraceptive management 08/09/2015   History of abnormal cervical Pap smear 08/09/2015   Abdominal pain, left lower quadrant 09/21/2013   Nausea and vomiting 09/20/2013   Irritable bowel syndrome 05/29/2010   CLOSED FRACTURE OF HEAD OF RADIUS 04/13/2008   FX CLOSED FIBULA NOS 04/13/2008    Past Surgical History:  Procedure Laterality Date   ABLATION     DILATION AND CURETTAGE OF UTERUS N/A 07/25/2017   Procedure: SUCTION DILATATION AND CURETTAGE;  Surgeon: Tilda Burrow, MD;  Location: [facility];  Service: Gynecology;  Laterality: N/A;   DILITATION & CURRETTAGE/HYSTROSCOPY WITH NOVASURE ABLATION N/A 09/30/2017   Procedure: DILATATION, HYSTEROSCOPY WITH NOVASURE ENDOMETRIAL ABLATION;  Surgeon: Tilda Burrow, MD;  Location: [facility];  Service: Gynecology;  Laterality: N/A;   ESOPHAGOGASTRODUODENOSCOPY N/A 11/17/2016   Procedure: ESOPHAGOGASTRODUODENOSCOPY (EGD);  Surgeon: Malissa Hippo, MD;  Location: [facility];  Service: Endoscopy;  Laterality: N/A;   LAPAROSCOPIC BILATERAL SALPINGECTOMY Bilateral 09/30/2017   Procedure: LAPAROSCOPIC BILATERAL SALPINGECTOMY;  Surgeon: Tilda Burrow, MD;  Location: [facility];  Service: Gynecology;  Laterality: Bilateral;   None      OB History     Gravida  5   Para  3  Term  3   Preterm      AB  2   Living  3      SAB  1   IAB  1   Ectopic      Multiple      Live Births               Home Medications    Prior to Admission medications   Medication Sig Start Date End Date Taking? Authorizing Provider  ciprofloxacin (CIPRO) 500 MG tablet Take 1 tablet (500 mg total) by mouth every 12 (twelve) hours. 07/07/22  Yes Tomi Bamberger, PA-C  phenazopyridine (PYRIDIUM) 100 MG tablet Take 1 tablet  (100 mg total) by mouth 3 (three) times daily as needed for pain. 07/07/22  Yes Tomi Bamberger, PA-C  AIMOVIG 70 MG/ML SOAJ Inject 1 Syringe into the skin every 30 (thirty) days. 08/16/19   [provider]  amphetamine-dextroamphetamine (ADDERALL XR) 30 MG 24 hr capsule Take 1 capsule (30 mg total) by mouth daily. 07/03/22   Mozingo, Thereasa Solo, NP  brexpiprazole (REXULTI) 2 MG TABS tablet Take 1 tablet (2 mg total) by mouth daily. 05/15/22   Mozingo, Thereasa Solo, NP  buPROPion (WELLBUTRIN XL) 300 MG 24 hr tablet Take 1 tablet (300 mg total) by mouth every evening. 05/15/22   Mozingo, Thereasa Solo, NP  lamoTRIgine (LAMICTAL) 100 MG tablet Take one tablet daily. 05/15/22   Mozingo, Thereasa Solo, NP  levocetirizine (XYZAL) 5 MG tablet Take 5 mg by mouth at bedtime. 09/13/19   [provider]  levothyroxine (SYNTHROID) 100 MCG tablet levothyroxine 137 mcg tablet  TAKE 1 TABLET BY MOUTH EVERY MORNING 30 MINS BEFORE MEALS    [provider]  megestrol (MEGACE) 40 MG tablet TAKE 1 TABLET(40 MG) BY MOUTH DAILY 03/13/22   [name], NP  promethazine (PHENERGAN) 25 MG tablet Take 25 mg by mouth every 6 (six) hours as needed.  03/25/20   [provider]  tirzepatide Greggory Keen) 7.5 MG/0.5ML Pen Inject 7.5 mg into the skin once a week.    [provider]  Ubrogepant (UBRELVY) 100 MG TABS Take by mouth.    [provider]  zolpidem (AMBIEN) 10 MG tablet Take 10 mg by mouth daily. 03/14/20   [provider]    Family History Family History  Problem Relation Age of Onset   Diabetes Mother    Hypertension Mother    Hypothyroidism Mother    Hypertension Father    Stroke Father    Cancer Father        prostate, with metastisis   Cancer Maternal Grandmother        Gaulbladder   Kidney disease Paternal Grandmother    Hypertension Paternal Grandmother    Hyperlipidemia Paternal Grandmother    Heart disease Paternal Grandmother     Heart attack Paternal Grandmother    Diabetes Daughter    Hypothyroidism Daughter    Raynaud syndrome Daughter    Rheumatologic disease Paternal Grandfather    Anxiety disorder Sister    Alcoholism Sister    Anxiety disorder Brother    Drug abuse Brother    Anxiety disorder Sister    Cancer Maternal Uncle        Lung   Colon cancer Neg Hx     Social History Social History   Tobacco Use   Smoking status: Never   Smokeless tobacco: Never   Tobacco comments:    Never smoker  Vaping Use  Vaping Use: Never used  Substance Use Topics   Alcohol use: Yes    Comment: occ.   Drug use: No     Allergies   Abilify [aripiprazole], Other, Reglan [metoclopramide], Triptans, and Dexamethasone   Review of Systems Review of Systems  Constitutional:  Negative for chills and fever.  Eyes:  Negative for discharge and redness.  Gastrointestinal:  Negative for abdominal pain, nausea and vomiting.  Genitourinary:  Positive for frequency and urgency.  Musculoskeletal:  Positive for back pain.     Physical Exam Triage Vital Signs ED Triage Vitals  Enc Vitals Group     BP      Pulse      Resp      Temp      Temp src      SpO2      Weight      Height      Head Circumference      Peak Flow      Pain Score      Pain Loc      Pain Edu?      Excl. in [place]?    No data found.  Updated Vital Signs BP 115/80 (BP Location: Right Arm)   Pulse 88   Temp (!) 97.4 F (36.3 C) (Oral)   Resp 18   SpO2 98%   Physical Exam Vitals and nursing note reviewed.  Constitutional:      General: She is not in acute distress.    Appearance: Normal appearance. She is not ill-appearing.  HENT:     Head: Normocephalic and atraumatic.  Eyes:     Conjunctiva/sclera: Conjunctivae normal.  Cardiovascular:     Rate and Rhythm: Normal rate.  Pulmonary:     Effort: Pulmonary effort is normal.  Neurological:     Mental Status: She is alert.  Psychiatric:        Mood and Affect: Mood normal.         Behavior: Behavior normal.        Thought Content: Thought content normal.      UC Treatments / Results  Labs (all labs ordered are listed, but only abnormal results are displayed) Labs Reviewed  POCT URINALYSIS DIP (MANUAL ENTRY) - Abnormal; Notable for the following components:      Result Value   Color, UA straw (*)    Clarity, UA cloudy (*)    Bilirubin, UA small (*)    Spec Grav, UA >=1.030 (*)    Blood, UA large (*)    Protein Ur, POC =100 (*)    Urobilinogen, UA 2.0 (*)    Leukocytes, UA Trace (*)    All other components within normal limits  URINE CULTURE    EKG   Radiology No results found.  Procedures Procedures (including critical care time)  Medications Ordered in UC Medications - No data to display  Initial Impression / Assessment and Plan / UC Course  I have reviewed the triage vital signs and the nursing notes.  Pertinent labs & imaging results that were available during my care of the patient were reviewed by me and considered in my medical decision making (see chart for details).    Macrobid initially prescribed however patient reports that typically she does not have improvement with macrobid. She does better with cipro and requests pyridium. Medication updated and urine culture ordered. Encouraged follow up if no gradual improvement or with any further concerns.   Final Clinical Impressions(s) / UC Diagnoses  Final diagnoses:  Acute cystitis without hematuria   Discharge Instructions   None    ED Prescriptions     Medication Sig Dispense Auth. Provider   nitrofurantoin, macrocrystal-monohydrate, (MACROBID) 100 MG capsule  (Status: Discontinued) Take 1 capsule (100 mg total) by mouth 2 (two) times daily. 10 capsule Tomi Bamberger, PA-C   ciprofloxacin (CIPRO) 500 MG tablet Take 1 tablet (500 mg total) by mouth every 12 (twelve) hours. 10 tablet Erma Pinto F, PA-C   phenazopyridine (PYRIDIUM) 100 MG tablet Take 1 tablet (100 mg  total) by mouth 3 (three) times daily as needed for pain. 10 tablet Tomi Bamberger, PA-C      PDMP not reviewed this encounter.   Tomi Bamberger, PA-C 07/07/22 1204

## 2022-07-07 NOTE — ED Triage Notes (Signed)
History of uti.  Onset of symptoms started last night with pressure.  Today has urgency, frequency, lower back pain and pressure.  Has not had any medications

## 2022-07-09 LAB — URINE CULTURE: Culture: 30000 — AB

## 2022-07-11 ENCOUNTER — Telehealth: Payer: Self-pay | Admitting: Adult Health

## 2022-07-11 NOTE — Telephone Encounter (Signed)
Received fax from [place]. Placed in Traci's box

## 2022-07-14 NOTE — Telephone Encounter (Signed)
Paper work completed but need to confirm with pt the dates needed.

## 2022-07-15 NOTE — Telephone Encounter (Signed)
Left pt another message to call back. I missed her call earlier

## 2022-07-15 NOTE — Telephone Encounter (Signed)
Left pt a message to call back to confirm her FMLA dates and sending correct information

## 2022-07-17 DIAGNOSIS — Z0289 Encounter for other administrative examinations: Secondary | ICD-10-CM

## 2022-07-17 NOTE — Telephone Encounter (Signed)
Spoke with pt yesterday evening and got everything clarified. Will have Gina sign

## 2022-07-19 NOTE — Telephone Encounter (Signed)
Completed and faxed.

## 2022-08-05 ENCOUNTER — Telehealth: Payer: Self-pay | Admitting: Adult Health

## 2022-08-05 ENCOUNTER — Other Ambulatory Visit: Payer: Self-pay

## 2022-08-05 MED ORDER — AMPHETAMINE-DEXTROAMPHET ER 30 MG PO CP24: 30.0000 mg | ORAL_CAPSULE | Freq: Every day | ORAL | 0 refills | Status: DC

## 2022-08-05 NOTE — Telephone Encounter (Signed)
Patient called in for refill on Adderall 30mg . States when she called pharmacy she was out of refills. Ph: [facility], [place]

## 2022-08-05 NOTE — Telephone Encounter (Signed)
Pended.

## 2022-08-14 ENCOUNTER — Telehealth (INDEPENDENT_AMBULATORY_CARE_PROVIDER_SITE_OTHER): Payer: 59 | Admitting: Adult Health

## 2022-08-14 ENCOUNTER — Encounter: Payer: Self-pay | Admitting: Adult Health

## 2022-08-14 ENCOUNTER — Ambulatory Visit: Payer: 59 | Admitting: Neurology

## 2022-08-14 DIAGNOSIS — F908 Attention-deficit hyperactivity disorder, other type: Secondary | ICD-10-CM | POA: Diagnosis not present

## 2022-08-14 DIAGNOSIS — F428 Other obsessive-compulsive disorder: Secondary | ICD-10-CM | POA: Diagnosis not present

## 2022-08-14 DIAGNOSIS — F319 Bipolar disorder, unspecified: Secondary | ICD-10-CM

## 2022-08-14 DIAGNOSIS — F331 Major depressive disorder, recurrent, moderate: Secondary | ICD-10-CM

## 2022-08-14 DIAGNOSIS — F411 Generalized anxiety disorder: Secondary | ICD-10-CM

## 2022-08-14 MED ORDER — AMPHETAMINE-DEXTROAMPHET ER 30 MG PO CP24: 30.0000 mg | ORAL_CAPSULE | Freq: Every day | ORAL | 0 refills | Status: DC

## 2022-08-14 NOTE — Progress Notes (Signed)
Marisa Johnson [id number] 1976-10-06 46 y.o.  Virtual Visit via Video Note  I connected with pt @ on 08/14/22 at 10:20 AM EDT by a video enabled telemedicine application and verified that I am speaking with the correct person using two identifiers.   I discussed the limitations of evaluation and management by telemedicine and the availability of in person appointments. The patient expressed understanding and agreed to proceed.  I discussed the assessment and treatment plan with the patient. The patient was provided an opportunity to ask questions and all were answered. The patient agreed with the plan and demonstrated an understanding of the instructions.   The patient was advised to call back or seek an in-person evaluation if the symptoms worsen or if the condition fails to improve as anticipated.  I provided 25 minutes of non-face-to-face time during this encounter.  The patient was located at home.  The provider was located at [facility].   Dorothyann Gibbs, NP   Subjective:   Patient ID:  Marisa Johnson is a 46 y.o. (DOB 12/16/1975) female.  Chief Complaint: No chief complaint on file.   HPI Marisa Johnson presents for follow-up of ADHD, MDD, GAD, obsessional thoughts, and BPD.  Referred by PCP - working with TSH regulation - currently.  Patient seen today for medication management.  Describes mood today as "ok". Pleasant. Tearful at times. Mood symptoms - denies depression - "feelings get hurt sometimes". Reports anxiety at times. Reports recent panic attacks. Denies irritability. Reports decreased worry and rumination. Reports decreased obsessive thoughts and acts. Mood is more consistent. Stating "I'm doing alright". Feels like medications are working well for her. Improved interest and motivation. Taking medications as prescribed. Working with a Paramedic - DBT.  Diagnosed with Bipolar in 2011 - mania. Working on thyroid regulation. Also has chronic fatigue  related to Covid.   Energy levels better - fatigued at times. Active, does not have a regular exercise routine.  Enjoys some usual interests and activities. Divorced. Dating. Has 3 grown children. Spending time with family. Appetite adequate.  Weight loss with Mounjaro/Phentermine - 180 pounds. Sleeps well most nights. Averages 10 hours with 10mg  of Ambien. Focus and concentration improved with increasing the dose of Adderall XR 20mg  to 30mg  daily. Completing tasks. Managing aspects of household. 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 Denies SI or HI.  Denies AH or VH. Consumes alcohol - 4/5 beers on the weekends. Denies THC use. History of self harm - clean since February of 2022.  Currently approved for FMLA for mental health and chronic fatigue.   Previous medication trials: Adderall, Abilify, Ambien Clonazepam, Wellbutrin, Rexulti, Buspar, Lamictal, Lithium, Adderall XR   Review of Systems:  Review of Systems  Musculoskeletal:  Negative for gait problem.  Neurological:  Negative for tremors.  Psychiatric/Behavioral:         Please refer to HPI    Medications: I have reviewed the patient's current medications.  Current Outpatient Medications  Medication Sig Dispense Refill   AIMOVIG 70 MG/ML SOAJ Inject 1 Syringe into the skin every 30 (thirty) days.     amphetamine-dextroamphetamine (ADDERALL XR) 30 MG 24 hr capsule Take 1 capsule (30 mg total) by mouth daily. 30 capsule 0   brexpiprazole (REXULTI) 2 MG TABS tablet Take 1 tablet (2 mg total) by mouth daily. 30 tablet 5   buPROPion (WELLBUTRIN XL) 300 MG 24 hr tablet Take 1 tablet (300 mg  total) by mouth every evening. 30 tablet 5   ciprofloxacin (CIPRO) 500 MG tablet Take 1 tablet (500 mg total) by mouth every 12 (twelve) hours. 10 tablet 0   lamoTRIgine (LAMICTAL) 100 MG tablet Take one tablet daily. 30 tablet 5   levocetirizine (XYZAL) 5 MG tablet Take 5 mg by mouth at bedtime.      levothyroxine (SYNTHROID) 100 MCG tablet levothyroxine 137 mcg tablet  TAKE 1 TABLET BY MOUTH EVERY MORNING 30 MINS BEFORE MEALS     megestrol (MEGACE) 40 MG tablet TAKE 1 TABLET(40 MG) BY MOUTH DAILY 30 tablet 6   phenazopyridine (PYRIDIUM) 100 MG tablet Take 1 tablet (100 mg total) by mouth 3 (three) times daily as needed for pain. 10 tablet 0   promethazine (PHENERGAN) 25 MG tablet Take 25 mg by mouth every 6 (six) hours as needed.      tirzepatide (MOUNJARO) 7.5 MG/0.5ML Pen Inject 7.5 mg into the skin once a week.     Ubrogepant (UBRELVY) 100 MG TABS Take by mouth.     zolpidem (AMBIEN) 10 MG tablet Take 10 mg by mouth daily.     No current facility-administered medications for this visit.    Medication Side Effects: None  Allergies:  Allergies  Allergen Reactions   Abilify [Aripiprazole] Other (See Comments)    Reaction:  Restless leg syndrome    Other     Bean Sprouts   Reglan [Metoclopramide] Other (See Comments)    Reaction:  Hallucinations    Triptans Other (See Comments)    Elevated blood pressure    Dexamethasone Rash    Past Medical History:  Diagnosis Date   ADHD    Anxiety    Carpal tunnel syndrome    Chronic fatigue    Connective tissue disorder ([place])    Contraceptive management 08/09/2015   Decreased libido 08/09/2015   Friable cervix 08/09/2015   History of abnormal cervical Pap smear 08/09/2015   Hypothyroidism    IBS (irritable bowel syndrome)    Irregular intermenstrual bleeding 08/09/2015   Major depressive disorder    Migraine    Mixed connective tissue disease (HCC)    Pseudobulbar affect    PTSD (post-traumatic stress disorder)    Sjogren's syndrome with lung involvement ([place])    Thyroid disease    Vaginal Pap smear, abnormal     Family History  Problem Relation Age of Onset   Diabetes Mother    Hypertension Mother    Hypothyroidism Mother    Hypertension Father    Stroke Father    Cancer Father        prostate, with metastisis    Cancer Maternal Grandmother        Gaulbladder   Kidney disease Paternal Grandmother    Hypertension Paternal Grandmother    Hyperlipidemia Paternal Grandmother    Heart disease Paternal Grandmother    Heart attack Paternal Grandmother    Diabetes Daughter    Hypothyroidism Daughter    Raynaud syndrome Daughter    Rheumatologic disease Paternal Grandfather    Anxiety disorder Sister    Alcoholism Sister    Anxiety disorder Brother    Drug abuse Brother    Anxiety disorder Sister    Cancer Maternal Uncle        Lung   Colon cancer Neg Hx     Social History   Socioeconomic History   Marital status: Legally Separated    Spouse name: Not on file   Number of children: 3  Years of education: Not on file   Highest education level: Not on file  Occupational History   Occupation: Registered Nurse    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    Comment: ED, 12 years  Tobacco Use   Smoking status: Never   Smokeless tobacco: Never   Tobacco comments:    Never smoker  Vaping Use   Vaping Use: Never used  Substance and Sexual Activity   Alcohol use: Yes    Comment: occ.   Drug use: No   Sexual activity: Yes    Birth control/protection: Surgical    Comment: tubal and ablation  Other Topics Concern   Not on file  Social History Narrative   Not on file   Social Determinants of Health   Financial Resource Strain: Low Risk  (03/13/2022)   Overall Financial Resource Strain (CARDIA)    Difficulty of Paying Living Expenses: Not very hard  Food Insecurity: No Food Insecurity (03/13/2022)   Hunger Vital Sign    Worried About Running Out of Food in the Last Year: Never true    Ran Out of Food in the Last Year: Never true  Transportation Needs: No Transportation Needs (03/13/2022)   PRAPARE - Administrator, Civil Service (Medical): No    Lack of Transportation (Non-Medical): No  Physical Activity: Insufficiently Active (03/13/2022)   Exercise Vital Sign    Days of Exercise per  Week: 2 days    Minutes of Exercise per Session: 30 min  Stress: Stress Concern Present (03/13/2022)   [employer] of Occupational Health - Occupational Stress Questionnaire    Feeling of Stress : To some extent  Social Connections: Moderately Isolated (03/13/2022)   Social Connection and Isolation Panel [NHANES]    Frequency of Communication with Friends and Family: Three times a week    Frequency of Social Gatherings with Friends and Family: Twice a week    Attends Religious Services: 1 to 4 times per year    Active Member of [employer] or Organizations: No    Attends Banker Meetings: Never    Marital Status: Divorced  Catering manager Violence: Not At Risk (03/13/2022)   Humiliation, Afraid, Rape, and Kick questionnaire    Fear of Current or Ex-Partner: No    Emotionally Abused: No    Physically Abused: No    Sexually Abused: No    Past Medical History, Surgical history, Social history, and Family history were reviewed and updated as appropriate.   Please see review of systems for further details on the patient's review from today.   Objective:   Physical Exam:  There were no vitals taken for this visit.  Physical Exam Constitutional:      General: She is not in acute distress. Musculoskeletal:        General: No deformity.  Neurological:     Mental Status: She is alert and oriented to person, place, and time.     Coordination: Coordination normal.  Psychiatric:        Attention and Perception: Attention and perception normal. She does not perceive auditory or visual hallucinations.        Mood and Affect: Mood normal. Mood is not anxious or depressed. Affect is not labile, blunt, angry or inappropriate.        Speech: Speech normal.        Behavior: Behavior normal.        Thought Content: Thought content normal. Thought content is not paranoid or delusional. Thought content does not  include homicidal or suicidal ideation. Thought content does not include  homicidal or suicidal plan.        Cognition and Memory: Cognition and memory normal.        Judgment: Judgment normal.     Comments: Insight intact     Lab Review:     Component Value Date/Time   NA 137 04/02/2021 1339   K 3.9 04/02/2021 1339   CL 105 04/02/2021 1339   CO2 24 04/02/2021 1339   GLUCOSE 95 04/02/2021 1339   BUN 10 04/02/2021 1339   CREATININE 1.08 (H) 04/02/2021 1339   CALCIUM 9.3 04/02/2021 1339   PROT 8.0 03/31/2020 1217   ALBUMIN 4.6 03/31/2020 1217   AST 28 03/31/2020 1217   ALT 29 03/31/2020 1217   ALKPHOS 84 03/31/2020 1217   BILITOT 0.8 03/31/2020 1217   GFRNONAA >60 04/02/2021 1339   GFRAA >60 03/31/2020 1217       Component Value Date/Time   WBC 6.3 04/02/2021 1339   RBC 4.40 04/02/2021 1339   HGB 14.0 04/02/2021 1339   HGB 13.5 07/25/2017 1238   HCT 42.9 04/02/2021 1339   HCT 42.4 07/25/2017 1238   PLT 313 04/02/2021 1339   PLT 300 07/25/2017 1238   MCV 97.5 04/02/2021 1339   MCV 91 07/25/2017 1238   MCH 31.8 04/02/2021 1339   MCHC 32.6 04/02/2021 1339   RDW 12.6 04/02/2021 1339   RDW 13.2 07/25/2017 1238   LYMPHSABS 1.9 04/02/2021 1339   LYMPHSABS 4.6 (H) 07/25/2017 1238   MONOABS 0.6 04/02/2021 1339   EOSABS 0.1 04/02/2021 1339   EOSABS 0.2 07/25/2017 1238   BASOSABS 0.0 04/02/2021 1339   BASOSABS 0.0 07/25/2017 1238    No results found for: "POCLITH", "LITHIUM"   No results found for: "PHENYTOIN", "PHENOBARB", "VALPROATE", "CBMZ"   .res Assessment: Plan:    Plan:  PDMP reviewed  Rexulti 2mg  daily Lamictal 100mg  daily Wellbutrin XL300mg  daily Adderall XR 30mg  daily  Monitor BP between visits.   Will update FMLA paperwork.  Time spent with patient was 25 minutes. Greater than 50% of face to face time with patient was spent on counseling and coordination of care.    RTC 6 weeks  Patient advised to contact office with any questions, adverse effects, or acute worsening in signs and symptoms.  Discussed potential  metabolic side effects associated with atypical antipsychotics, as well as potential risk for movement side effects. Advised pt to contact office if movement side effects occur.    Discussed potential benefits, risks, and side effects of stimulants with patient to include increased heart rate, palpitations, insomnia, increased anxiety, increased irritability, or decreased appetite.  Instructed patient to contact office if experiencing any significant tolerability issues.   Diagnoses and all orders for this visit:  Bipolar I disorder (HCC)  Attention deficit hyperactivity disorder (ADHD), other type -     amphetamine-dextroamphetamine (ADDERALL XR) 30 MG 24 hr capsule; Take 1 capsule (30 mg total) by mouth daily.  Major depressive disorder, recurrent episode, moderate (HCC)  Obsessional thoughts  Generalized anxiety disorder     Please see After Visit Summary for patient specific instructions.  No future appointments.  No orders of the defined types were placed in this encounter.     -------------------------------

## 2022-08-15 ENCOUNTER — Telehealth: Payer: Self-pay

## 2022-08-15 NOTE — Telephone Encounter (Signed)
Pt called back and she just needs her frequency updated to 4 episodes instead of 3. #8 then initial and fax back.   Updated completed, signed and faxed per request to [facility]

## 2022-08-15 NOTE — Telephone Encounter (Signed)
Pt had apt with Rollene Fare yesterday 08/14/22, she was needing to update her FMLA paper work so I did try to reach her to discuss. Left voicemail to call back.

## 2022-08-23 ENCOUNTER — Telehealth: Payer: Self-pay | Admitting: Adult Health

## 2022-08-23 NOTE — Telephone Encounter (Signed)
Received fax from [place]. LOA request information needed. Placed in Traci's box

## 2022-08-23 NOTE — Telephone Encounter (Signed)
Noted  

## 2022-08-23 NOTE — Telephone Encounter (Signed)
Received a fax from her employer that said the intermittent FMLA was already exceeded with 4 days per month so pt requesting 5 days/month added.

## 2022-08-26 NOTE — Telephone Encounter (Signed)
Faxed with update

## 2022-08-26 NOTE — Telephone Encounter (Signed)
Updated information will have Gina sign then fax back as requested.

## 2022-09-10 ENCOUNTER — Other Ambulatory Visit: Payer: Self-pay | Admitting: Adult Health

## 2022-09-10 DIAGNOSIS — F319 Bipolar disorder, unspecified: Secondary | ICD-10-CM

## 2022-09-24 ENCOUNTER — Ambulatory Visit: Payer: 59 | Admitting: Adult Health

## 2022-10-02 ENCOUNTER — Encounter: Payer: Self-pay | Admitting: Adult Health

## 2022-10-02 ENCOUNTER — Telehealth (INDEPENDENT_AMBULATORY_CARE_PROVIDER_SITE_OTHER): Payer: 59 | Admitting: Adult Health

## 2022-10-02 DIAGNOSIS — F319 Bipolar disorder, unspecified: Secondary | ICD-10-CM | POA: Diagnosis not present

## 2022-10-02 DIAGNOSIS — F331 Major depressive disorder, recurrent, moderate: Secondary | ICD-10-CM

## 2022-10-02 DIAGNOSIS — F908 Attention-deficit hyperactivity disorder, other type: Secondary | ICD-10-CM | POA: Diagnosis not present

## 2022-10-02 DIAGNOSIS — F411 Generalized anxiety disorder: Secondary | ICD-10-CM

## 2022-10-02 DIAGNOSIS — F428 Other obsessive-compulsive disorder: Secondary | ICD-10-CM

## 2022-10-02 MED ORDER — AMPHETAMINE-DEXTROAMPHET ER 30 MG PO CP24: 30.0000 mg | ORAL_CAPSULE | Freq: Every day | ORAL | 0 refills | Status: DC

## 2022-10-02 NOTE — Progress Notes (Signed)
Marisa Johnson [id number] January 02, 1976 46 y.o.  Virtual Visit via Video Note  I connected with pt @ on 10/02/22 at  9:00 AM EST by a video enabled telemedicine application and verified that I am speaking with the correct person using two identifiers.   I discussed the limitations of evaluation and management by telemedicine and the availability of in person appointments. The patient expressed understanding and agreed to proceed.  I discussed the assessment and treatment plan with the patient. The patient was provided an opportunity to ask questions and all were answered. The patient agreed with the plan and demonstrated an understanding of the instructions.   The patient was advised to call back or seek an in-person evaluation if the symptoms worsen or if the condition fails to improve as anticipated.  I provided 25 minutes of non-face-to-face time during this encounter.  The patient was located at home.  [facility]   Dorothyann Gibbsegina N Ardene Remley, NP   Subjective:   Patient ID:  Marisa Johnson is a 46 y.o. (DOB January 02, 1976) female.  Chief Complaint: No chief complaint on file.   HPI Marisa Johnson presents for follow-up of ADHD, MDD, GAD, obsessional thoughts, and BPD.  Diagnosed with Bipolar in 2011 - mania. Working on thyroid regulation. Also has chronic fatigue related to Covid.   Referred by PCP - working with TSH regulation - 100mcg currently.  Patient seen today for medication management.  Describes mood today as "ok". Pleasant. Tearful at times. Mood symptoms - denies depression. Feels anxious at times - "work related". Denies recent panic attacks. Denies irritability. Reports decreased worry and rumination. Reports decreased obsessive thoughts and acts. Mood is more consistent. Stating "I'm feeling good". Feels like medications are working well for her. Improved interest and motivation. Taking medications as prescribed.  Energy levels better - fatigued  at times. Active, does not have a regular exercise routine.  Enjoys some usual interests and activities. Divorced. Dating. Has 3 grown children. Spending time with family. Appetite adequate. Weight loss with Mounjaro/Phentermine - 180 pounds. Sleeps well most nights. Averages 10 hours. Focus and concentration improved. Completing tasks. Managing aspects of household. 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 Denies SI or HI.  Denies AH or VH. Consumes alcohol - 4/5 beers once a week. Denies THC use. History of self harm - clean since February of 2022.  Currently approved for FMLA for mental health and chronic fatigue.   Previous medication trials: Adderall, Abilify, Ambien Clonazepam, Wellbutrin, Rexulti, Buspar, Lamictal, Lithium, Adderall XR    Review of Systems:  Review of Systems  Musculoskeletal:  Negative for gait problem.  Neurological:  Negative for tremors.  Psychiatric/Behavioral:         Please refer to HPI    Medications: I have reviewed the patient's current medications.  Current Outpatient Medications  Medication Sig Dispense Refill   [START ON 10/30/2022] amphetamine-dextroamphetamine (ADDERALL XR) 30 MG 24 hr capsule Take 1 capsule (30 mg total) by mouth daily. 30 capsule 0   AIMOVIG 70 MG/ML SOAJ Inject 1 Syringe into the skin every 30 (thirty) days.     amphetamine-dextroamphetamine (ADDERALL XR) 30 MG 24 hr capsule Take 1 capsule (30 mg total) by mouth daily. 30 capsule 0   buPROPion (WELLBUTRIN XL) 300 MG 24 hr tablet Take 1 tablet (300 mg total) by mouth every evening. 30 tablet 5   ciprofloxacin (CIPRO) 500 MG tablet Take 1  tablet (500 mg total) by mouth every 12 (twelve) hours. 10 tablet 0   lamoTRIgine (LAMICTAL) 100 MG tablet Take one tablet daily. 30 tablet 5   levocetirizine (XYZAL) 5 MG tablet Take 5 mg by mouth at bedtime.     levothyroxine (SYNTHROID) 100 MCG tablet levothyroxine 137 mcg tablet  TAKE 1 TABLET BY MOUTH  EVERY MORNING 30 MINS BEFORE MEALS     megestrol (MEGACE) 40 MG tablet TAKE 1 TABLET(40 MG) BY MOUTH DAILY 30 tablet 6   phenazopyridine (PYRIDIUM) 100 MG tablet Take 1 tablet (100 mg total) by mouth 3 (three) times daily as needed for pain. 10 tablet 0   promethazine (PHENERGAN) 25 MG tablet Take 25 mg by mouth every 6 (six) hours as needed.      REXULTI 2 MG TABS tablet TAKE 1 TABLET(2 MG) BY MOUTH DAILY 30 tablet 5   tirzepatide (MOUNJARO) 7.5 MG/0.5ML Pen Inject 7.5 mg into the skin once a week.     Ubrogepant (UBRELVY) 100 MG TABS Take by mouth.     zolpidem (AMBIEN) 10 MG tablet Take 10 mg by mouth daily.     No current facility-administered medications for this visit.    Medication Side Effects: None  Allergies:  Allergies  Allergen Reactions   Abilify [Aripiprazole] Other (See Comments)    Reaction:  Restless leg syndrome    Other     Bean Sprouts   Reglan [Metoclopramide] Other (See Comments)    Reaction:  Hallucinations    Triptans Other (See Comments)    Elevated blood pressure    Dexamethasone Rash    Past Medical History:  Diagnosis Date   ADHD    Anxiety    Carpal tunnel syndrome    Chronic fatigue    Connective tissue disorder (HCC)    Contraceptive management 08/09/2015   Decreased libido 08/09/2015   Friable cervix 08/09/2015   History of abnormal cervical Pap smear 08/09/2015   Hypothyroidism    IBS (irritable bowel syndrome)    Irregular intermenstrual bleeding 08/09/2015   Major depressive disorder    Migraine    Mixed connective tissue disease (HCC)    Pseudobulbar affect    PTSD (post-traumatic stress disorder)    Sjogren's syndrome with lung involvement (HCC)    Thyroid disease    Vaginal Pap smear, abnormal     Family History  Problem Relation Age of Onset   Diabetes Mother    Hypertension Mother    Hypothyroidism Mother    Hypertension Father    Stroke Father    Cancer Father        prostate, with metastisis   Cancer Maternal  Grandmother        Gaulbladder   Kidney disease Paternal Grandmother    Hypertension Paternal Grandmother    Hyperlipidemia Paternal Grandmother    Heart disease Paternal Grandmother    Heart attack Paternal Grandmother    Diabetes Daughter    Hypothyroidism Daughter    Raynaud syndrome Daughter    Rheumatologic disease Paternal Grandfather    Anxiety disorder Sister    Alcoholism Sister    Anxiety disorder Brother    Drug abuse Brother    Anxiety disorder Sister    Cancer Maternal Uncle        Lung   Colon cancer Neg Hx     Social History   Socioeconomic History   Marital status: Legally Separated    Spouse name: Not on file   Number of children: 3  Years of education: Not on file   Highest education level: Not on file  Occupational History   Occupation: Registered Nurse    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    Comment: ED, 12 years  Tobacco Use   Smoking status: Never   Smokeless tobacco: Never   Tobacco comments:    Never smoker  Vaping Use   Vaping Use: Never used  Substance and Sexual Activity   Alcohol use: Yes    Comment: occ.   Drug use: No   Sexual activity: Yes    Birth control/protection: Surgical    Comment: tubal and ablation  Other Topics Concern   Not on file  Social History Narrative   Not on file   Social Determinants of Health   Financial Resource Strain: Low Risk  (03/13/2022)   Overall Financial Resource Strain (CARDIA)    Difficulty of Paying Living Expenses: Not very hard  Food Insecurity: No Food Insecurity (03/13/2022)   Hunger Vital Sign    Worried About Running Out of Food in the Last Year: Never true    Ran Out of Food in the Last Year: Never true  Transportation Needs: No Transportation Needs (03/13/2022)   PRAPARE - Administrator, Civil Service (Medical): No    Lack of Transportation (Non-Medical): No  Physical Activity: Insufficiently Active (03/13/2022)   Exercise Vital Sign    Days of Exercise per Week: 2 days     Minutes of Exercise per Session: 30 min  Stress: Stress Concern Present (03/13/2022)   [employer] of Occupational Health - Occupational Stress Questionnaire    Feeling of Stress : To some extent  Social Connections: Moderately Isolated (03/13/2022)   Social Connection and Isolation Panel [NHANES]    Frequency of Communication with Friends and Family: Three times a week    Frequency of Social Gatherings with Friends and Family: Twice a week    Attends Religious Services: 1 to 4 times per year    Active Member of [employer] or Organizations: No    Attends Banker Meetings: Never    Marital Status: Divorced  Catering manager Violence: Not At Risk (03/13/2022)   Humiliation, Afraid, Rape, and Kick questionnaire    Fear of Current or Ex-Partner: No    Emotionally Abused: No    Physically Abused: No    Sexually Abused: No    Past Medical History, Surgical history, Social history, and Family history were reviewed and updated as appropriate.   Please see review of systems for further details on the patient's review from today.   Objective:   Physical Exam:  There were no vitals taken for this visit.  Physical Exam  Lab Review:     Component Value Date/Time   NA 137 04/02/2021 1339   K 3.9 04/02/2021 1339   CL 105 04/02/2021 1339   CO2 24 04/02/2021 1339   GLUCOSE 95 04/02/2021 1339   BUN 10 04/02/2021 1339   CREATININE 1.08 (H) 04/02/2021 1339   CALCIUM 9.3 04/02/2021 1339   PROT 8.0 03/31/2020 1217   ALBUMIN 4.6 03/31/2020 1217   AST 28 03/31/2020 1217   ALT 29 03/31/2020 1217   ALKPHOS 84 03/31/2020 1217   BILITOT 0.8 03/31/2020 1217   GFRNONAA >60 04/02/2021 1339   GFRAA >60 03/31/2020 1217       Component Value Date/Time   WBC 6.3 04/02/2021 1339   RBC 4.40 04/02/2021 1339   HGB 14.0 04/02/2021 1339   HGB 13.5 07/25/2017 1238  HCT 42.9 04/02/2021 1339   HCT 42.4 07/25/2017 1238   PLT 313 04/02/2021 1339   PLT 300 07/25/2017 1238   MCV 97.5  04/02/2021 1339   MCV 91 07/25/2017 1238   MCH 31.8 04/02/2021 1339   MCHC 32.6 04/02/2021 1339   RDW 12.6 04/02/2021 1339   RDW 13.2 07/25/2017 1238   LYMPHSABS 1.9 04/02/2021 1339   LYMPHSABS 4.6 (H) 07/25/2017 1238   MONOABS 0.6 04/02/2021 1339   EOSABS 0.1 04/02/2021 1339   EOSABS 0.2 07/25/2017 1238   BASOSABS 0.0 04/02/2021 1339   BASOSABS 0.0 07/25/2017 1238    No results found for: "POCLITH", "LITHIUM"   No results found for: "PHENYTOIN", "PHENOBARB", "VALPROATE", "CBMZ"   .res Assessment: Plan:    Plan:  PDMP reviewed  Rexulti 2mg  daily Lamictal 100mg  daily Wellbutrin XL300mg  daily Adderall XR 30mg  daily  Monitor BP between visits while taking stimulant medication.   Will update FMLA paperwork.  Time spent with patient was 25 minutes. Greater than 50% of face to face time with patient was spent on counseling and coordination of care.    RTC 8 weeks  Patient advised to contact office with any questions, adverse effects, or acute worsening in signs and symptoms.  Discussed potential metabolic side effects associated with atypical antipsychotics, as well as potential risk for movement side effects. Advised pt to contact office if movement side effects occur.    Discussed potential benefits, risks, and side effects of stimulants with patient to include increased heart rate, palpitations, insomnia, increased anxiety, increased irritability, or decreased appetite.  Instructed patient to contact office if experiencing any significant tolerability issues.   Diagnoses and all orders for this visit:  Bipolar I disorder (HCC)  Attention deficit hyperactivity disorder (ADHD), other type -     amphetamine-dextroamphetamine (ADDERALL XR) 30 MG 24 hr capsule; Take 1 capsule (30 mg total) by mouth daily. -     amphetamine-dextroamphetamine (ADDERALL XR) 30 MG 24 hr capsule; Take 1 capsule (30 mg total) by mouth daily.  Major depressive disorder, recurrent episode,  moderate (HCC)  Obsessional thoughts  Generalized anxiety disorder     Please see After Visit Summary for patient specific instructions.  No future appointments.  No orders of the defined types were placed in this encounter.     -------------------------------

## 2022-10-11 ENCOUNTER — Encounter: Payer: Self-pay | Admitting: Adult Health

## 2022-10-11 ENCOUNTER — Telehealth (INDEPENDENT_AMBULATORY_CARE_PROVIDER_SITE_OTHER): Payer: 59 | Admitting: Adult Health

## 2022-10-11 DIAGNOSIS — F319 Bipolar disorder, unspecified: Secondary | ICD-10-CM

## 2022-10-11 DIAGNOSIS — F411 Generalized anxiety disorder: Secondary | ICD-10-CM

## 2022-10-11 DIAGNOSIS — F331 Major depressive disorder, recurrent, moderate: Secondary | ICD-10-CM | POA: Diagnosis not present

## 2022-10-11 DIAGNOSIS — F428 Other obsessive-compulsive disorder: Secondary | ICD-10-CM

## 2022-10-11 DIAGNOSIS — F908 Attention-deficit hyperactivity disorder, other type: Secondary | ICD-10-CM | POA: Diagnosis not present

## 2022-10-11 NOTE — Progress Notes (Signed)
Marisa Johnson [id number] 1976/07/08 46 y.o.  Virtual Visit via Video Note  I connected with pt @ on 10/11/22 at  3:20 PM EST by a video enabled telemedicine application and verified that I am speaking with the correct person using two identifiers.   I discussed the limitations of evaluation and management by telemedicine and the availability of in person appointments. The patient expressed understanding and agreed to proceed.  I discussed the assessment and treatment plan with the patient. The patient was provided an opportunity to ask questions and all were answered. The patient agreed with the plan and demonstrated an understanding of the instructions.   The patient was advised to call back or seek an in-person evaluation if the symptoms worsen or if the condition fails to improve as anticipated.  I provided 25 minutes of non-face-to-face time during this encounter.  The patient was located at home.  [facility]   Dorothyann Gibbsegina N Kayelyn Lemon, NP   Subjective:   Patient ID:  Marisa DubinLori A Duran is a 46 y.o. (DOB 1976/07/08) female.  Chief Complaint: No chief complaint on file.   HPI Marisa DubinLori A Augsburger presents for follow-up of ADHD, MDD, GAD, obsessional thoughts, and BPD.  Diagnosed with Bipolar in 2011 - mania. Working on thyroid regulation. Also has chronic fatigue related to Covid.   Referred by PCP - working with TSH regulation - 100mcg currently.  Patient seen today for medication management.  Describes mood today as "ok". Pleasant. Tearful at times. Mood symptoms - decreased depression, anxiety, and irritability. Denies recent panic attacks. Reports some worry and rumination - "worried about money". Reports decreased obsessive thoughts and acts. Mood is more consistent. Stating "I feel like I'm doing alright". Feels like medications are working well for her. Improved interest and motivation. Taking medications as prescribed.  Energy levels stable. Active,  does not have a regular exercise routine.  Enjoys some usual interests and activities. Divorced. Dating. Has 3 grown children. Spending time with family. Appetite adequate. Weight loss with Mounjaro/Phentermine - 180 pounds. Sleeps well most nights. Averages 10 hours. Focus and concentration improved. Completing tasks. Managing aspects of household. 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 Denies SI or HI.  Denies AH or VH. Consumes alcohol - 4/5 beers once a week. Denies THC use. History of self harm - clean since February of 2022 until yesterday - got angry and cut flat part of her arm last night. Reports missing Wellbutrin for 3 days before cutting incident - "that may have something to do with it".   Currently approved for FMLA for mental health and chronic fatigue.   Previous medication trials: Adderall, Abilify, Ambien Clonazepam, Wellbutrin, Rexulti, Buspar, Lamictal, Lithium, Adderall XR   Review of Systems:  Review of Systems  Musculoskeletal:  Negative for gait problem.  Neurological:  Negative for tremors.  Psychiatric/Behavioral:         Please refer to HPI    Medications: I have reviewed the patient's current medications.  Current Outpatient Medications  Medication Sig Dispense Refill   AIMOVIG 70 MG/ML SOAJ Inject 1 Syringe into the skin every 30 (thirty) days.     amphetamine-dextroamphetamine (ADDERALL XR) 30 MG 24 hr capsule Take 1 capsule (30 mg total) by mouth daily. 30 capsule 0   [START ON 10/30/2022] amphetamine-dextroamphetamine (ADDERALL XR) 30 MG 24 hr capsule Take 1 capsule (30 mg total) by mouth daily. 30 capsule 0   buPROPion (  WELLBUTRIN XL) 300 MG 24 hr tablet Take 1 tablet (300 mg total) by mouth every evening. 30 tablet 5   ciprofloxacin (CIPRO) 500 MG tablet Take 1 tablet (500 mg total) by mouth every 12 (twelve) hours. 10 tablet 0   lamoTRIgine (LAMICTAL) 100 MG tablet Take one tablet daily. 30 tablet 5   levocetirizine  (XYZAL) 5 MG tablet Take 5 mg by mouth at bedtime.     levothyroxine (SYNTHROID) 100 MCG tablet levothyroxine 137 mcg tablet  TAKE 1 TABLET BY MOUTH EVERY MORNING 30 MINS BEFORE MEALS     megestrol (MEGACE) 40 MG tablet TAKE 1 TABLET(40 MG) BY MOUTH DAILY 30 tablet 6   phenazopyridine (PYRIDIUM) 100 MG tablet Take 1 tablet (100 mg total) by mouth 3 (three) times daily as needed for pain. 10 tablet 0   promethazine (PHENERGAN) 25 MG tablet Take 25 mg by mouth every 6 (six) hours as needed.      REXULTI 2 MG TABS tablet TAKE 1 TABLET(2 MG) BY MOUTH DAILY 30 tablet 5   tirzepatide (MOUNJARO) 7.5 MG/0.5ML Pen Inject 7.5 mg into the skin once a week.     Ubrogepant (UBRELVY) 100 MG TABS Take by mouth.     zolpidem (AMBIEN) 10 MG tablet Take 10 mg by mouth daily.     No current facility-administered medications for this visit.    Medication Side Effects: None  Allergies:  Allergies  Allergen Reactions   Abilify [Aripiprazole] Other (See Comments)    Reaction:  Restless leg syndrome    Other     Bean Sprouts   Reglan [Metoclopramide] Other (See Comments)    Reaction:  Hallucinations    Triptans Other (See Comments)    Elevated blood pressure    Dexamethasone Rash    Past Medical History:  Diagnosis Date   ADHD    Anxiety    Carpal tunnel syndrome    Chronic fatigue    Connective tissue disorder (HCC)    Contraceptive management 08/09/2015   Decreased libido 08/09/2015   Friable cervix 08/09/2015   History of abnormal cervical Pap smear 08/09/2015   Hypothyroidism    IBS (irritable bowel syndrome)    Irregular intermenstrual bleeding 08/09/2015   Major depressive disorder    Migraine    Mixed connective tissue disease (HCC)    Pseudobulbar affect    PTSD (post-traumatic stress disorder)    Sjogren's syndrome with lung involvement (HCC)    Thyroid disease    Vaginal Pap smear, abnormal     Family History  Problem Relation Age of Onset   Diabetes Mother    Hypertension  Mother    Hypothyroidism Mother    Hypertension Father    Stroke Father    Cancer Father        prostate, with metastisis   Cancer Maternal Grandmother        Gaulbladder   Kidney disease Paternal Grandmother    Hypertension Paternal Grandmother    Hyperlipidemia Paternal Grandmother    Heart disease Paternal Grandmother    Heart attack Paternal Grandmother    Diabetes Daughter    Hypothyroidism Daughter    Raynaud syndrome Daughter    Rheumatologic disease Paternal Grandfather    Anxiety disorder Sister    Alcoholism Sister    Anxiety disorder Brother    Drug abuse Brother    Anxiety disorder Sister    Cancer Maternal Uncle        Lung   Colon cancer Neg Hx  Social History   Socioeconomic History   Marital status: Legally Separated    Spouse name: Not on file   Number of children: 3   Years of education: Not on file   Highest education level: Not on file  Occupational History   Occupation: Registered Nurse    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    Comment: ED, 12 years  Tobacco Use   Smoking status: Never   Smokeless tobacco: Never   Tobacco comments:    Never smoker  Vaping Use   Vaping Use: Never used  Substance and Sexual Activity   Alcohol use: Yes    Comment: occ.   Drug use: No   Sexual activity: Yes    Birth control/protection: Surgical    Comment: tubal and ablation  Other Topics Concern   Not on file  Social History Narrative   Not on file   Social Determinants of Health   Financial Resource Strain: Low Risk  (03/13/2022)   Overall Financial Resource Strain (CARDIA)    Difficulty of Paying Living Expenses: Not very hard  Food Insecurity: No Food Insecurity (03/13/2022)   Hunger Vital Sign    Worried About Running Out of Food in the Last Year: Never true    Ran Out of Food in the Last Year: Never true  Transportation Needs: No Transportation Needs (03/13/2022)   PRAPARE - Administrator, Civil Service (Medical): No    Lack of  Transportation (Non-Medical): No  Physical Activity: Insufficiently Active (03/13/2022)   Exercise Vital Sign    Days of Exercise per Week: 2 days    Minutes of Exercise per Session: 30 min  Stress: Stress Concern Present (03/13/2022)   [employer] of Occupational Health - Occupational Stress Questionnaire    Feeling of Stress : To some extent  Social Connections: Moderately Isolated (03/13/2022)   Social Connection and Isolation Panel [NHANES]    Frequency of Communication with Friends and Family: Three times a week    Frequency of Social Gatherings with Friends and Family: Twice a week    Attends Religious Services: 1 to 4 times per year    Active Member of [employer] or Organizations: No    Attends Banker Meetings: Never    Marital Status: Divorced  Catering manager Violence: Not At Risk (03/13/2022)   Humiliation, Afraid, Rape, and Kick questionnaire    Fear of Current or Ex-Partner: No    Emotionally Abused: No    Physically Abused: No    Sexually Abused: No    Past Medical History, Surgical history, Social history, and Family history were reviewed and updated as appropriate.   Please see review of systems for further details on the patient's review from today.   Objective:   Physical Exam:  There were no vitals taken for this visit.  Physical Exam Constitutional:      General: She is not in acute distress. Musculoskeletal:        General: No deformity.  Neurological:     Mental Status: She is alert and oriented to person, place, and time.     Coordination: Coordination normal.  Psychiatric:        Attention and Perception: Attention and perception normal. She does not perceive auditory or visual hallucinations.        Mood and Affect: Mood normal. Mood is not anxious or depressed. Affect is not labile, blunt, angry or inappropriate.        Speech: Speech normal.  Behavior: Behavior normal.        Thought Content: Thought content normal. Thought  content is not paranoid or delusional. Thought content does not include homicidal or suicidal ideation. Thought content does not include homicidal or suicidal plan.        Cognition and Memory: Cognition and memory normal.        Judgment: Judgment normal.     Comments: Insight intact     Lab Review:     Component Value Date/Time   NA 137 04/02/2021 1339   K 3.9 04/02/2021 1339   CL 105 04/02/2021 1339   CO2 24 04/02/2021 1339   GLUCOSE 95 04/02/2021 1339   BUN 10 04/02/2021 1339   CREATININE 1.08 (H) 04/02/2021 1339   CALCIUM 9.3 04/02/2021 1339   PROT 8.0 03/31/2020 1217   ALBUMIN 4.6 03/31/2020 1217   AST 28 03/31/2020 1217   ALT 29 03/31/2020 1217   ALKPHOS 84 03/31/2020 1217   BILITOT 0.8 03/31/2020 1217   GFRNONAA >60 04/02/2021 1339   GFRAA >60 03/31/2020 1217       Component Value Date/Time   WBC 6.3 04/02/2021 1339   RBC 4.40 04/02/2021 1339   HGB 14.0 04/02/2021 1339   HGB 13.5 07/25/2017 1238   HCT 42.9 04/02/2021 1339   HCT 42.4 07/25/2017 1238   PLT 313 04/02/2021 1339   PLT 300 07/25/2017 1238   MCV 97.5 04/02/2021 1339   MCV 91 07/25/2017 1238   MCH 31.8 04/02/2021 1339   MCHC 32.6 04/02/2021 1339   RDW 12.6 04/02/2021 1339   RDW 13.2 07/25/2017 1238   LYMPHSABS 1.9 04/02/2021 1339   LYMPHSABS 4.6 (H) 07/25/2017 1238   MONOABS 0.6 04/02/2021 1339   EOSABS 0.1 04/02/2021 1339   EOSABS 0.2 07/25/2017 1238   BASOSABS 0.0 04/02/2021 1339   BASOSABS 0.0 07/25/2017 1238    No results found for: "POCLITH", "LITHIUM"   No results found for: "PHENYTOIN", "PHENOBARB", "VALPROATE", "CBMZ"   .res Assessment: Plan:   Plan:  PDMP reviewed  Rexulti 2mg  daily Lamictal 100mg  daily Wellbutrin XL300mg  daily Adderall XR 30mg  daily  Monitor BP between visits while taking stimulant medication.   Will update FMLA paperwork.  Time spent with patient was 25 minutes. Greater than 50% of face to face time with patient was spent on counseling and  coordination of care.    RTC 8 weeks  Patient advised to contact office with any questions, adverse effects, or acute worsening in signs and symptoms.  Discussed potential metabolic side effects associated with atypical antipsychotics, as well as potential risk for movement side effects. Advised pt to contact office if movement side effects occur.    Discussed potential benefits, risks, and side effects of stimulants with patient to include increased heart rate, palpitations, insomnia, increased anxiety, increased irritability, or decreased appetite.  Instructed patient to contact office if experiencing any significant tolerability issues.   Diagnoses and all orders for this visit:  Bipolar I disorder (HCC)  Attention deficit hyperactivity disorder (ADHD), other type  Major depressive disorder, recurrent episode, moderate (HCC)  Generalized anxiety disorder  Obsessional thoughts     Please see After Visit Summary for patient specific instructions.  Future Appointments  Date Time Provider Department Center  12/03/2022 10:00 AM Jenicka Coxe, , NP [facility] None    No orders of the defined types were placed in this encounter.     -------------------------------

## 2022-10-12 ENCOUNTER — Other Ambulatory Visit: Payer: Self-pay | Admitting: Adult Health

## 2022-11-05 ENCOUNTER — Other Ambulatory Visit: Payer: Self-pay

## 2022-11-05 DIAGNOSIS — F331 Major depressive disorder, recurrent, moderate: Secondary | ICD-10-CM

## 2022-11-05 MED ORDER — BUPROPION HCL ER (XL) 300 MG PO TB24: 300.0000 mg | ORAL_TABLET | Freq: Every evening | ORAL | 5 refills | Status: DC

## 2022-11-08 ENCOUNTER — Telehealth: Payer: Self-pay | Admitting: Adult Health

## 2022-11-08 ENCOUNTER — Other Ambulatory Visit: Payer: Self-pay

## 2022-11-08 DIAGNOSIS — F908 Attention-deficit hyperactivity disorder, other type: Secondary | ICD-10-CM

## 2022-11-08 NOTE — Telephone Encounter (Signed)
Pt has rx on file for generic at pharmacy

## 2022-11-08 NOTE — Telephone Encounter (Signed)
Patient called needing refill for Adderall. She states that at the end of the year she received letters stating that her insurance will not longer cover name brand and she instead needs the generic brand. She is down to her last five pills and needs prescription sent to pharmacy. Ph: [phone] RWER 2/20 Pharmacy [facility] [place], [place]

## 2022-11-27 ENCOUNTER — Telehealth: Payer: Self-pay | Admitting: Adult Health

## 2022-11-27 NOTE — Telephone Encounter (Signed)
Pt called and is out of her Rexulti.  Was told by pharmacy that [place] needs a PA. Can we please start it? RX is at [place], [place] [address]  Please submit as urgent.

## 2022-11-27 NOTE — Telephone Encounter (Signed)
Prior Approval received [id number]. REXULTI TAB 2MG is approved through 11/28/2023.

## 2022-11-28 NOTE — Telephone Encounter (Signed)
Patient notified

## 2022-12-03 ENCOUNTER — Telehealth (INDEPENDENT_AMBULATORY_CARE_PROVIDER_SITE_OTHER): Payer: Self-pay | Admitting: Adult Health

## 2022-12-03 DIAGNOSIS — F489 Nonpsychotic mental disorder, unspecified: Secondary | ICD-10-CM

## 2022-12-03 NOTE — Progress Notes (Signed)
Patient no show appointment. ? ?

## 2022-12-12 ENCOUNTER — Encounter: Payer: Self-pay | Admitting: Radiology

## 2022-12-17 ENCOUNTER — Telehealth: Payer: Self-pay | Admitting: Adult Health

## 2022-12-17 ENCOUNTER — Telehealth: Payer: 59 | Admitting: Adult Health

## 2022-12-17 NOTE — Telephone Encounter (Signed)
Has an appt with Barnett Applebaum tomorrow. Will wait and discuss with her then.

## 2022-12-17 NOTE — Telephone Encounter (Signed)
Next visit is 12/18/22. Wyndi said [facility] in [place] has the Adderall IR 20 mg in stock. She has been taking the ER. Pharmacy is:  [id number] - [place], [place] - [address] AT [place] [address]   Phone: [phone]  Fax: [phone]

## 2022-12-17 NOTE — Telephone Encounter (Signed)
LVM to RC 

## 2022-12-18 ENCOUNTER — Ambulatory Visit: Payer: 59 | Admitting: Adult Health

## 2023-01-28 ENCOUNTER — Encounter: Payer: Self-pay | Admitting: Adult Health

## 2023-01-28 ENCOUNTER — Telehealth (INDEPENDENT_AMBULATORY_CARE_PROVIDER_SITE_OTHER): Payer: 59 | Admitting: Adult Health

## 2023-01-28 ENCOUNTER — Other Ambulatory Visit (HOSPITAL_COMMUNITY): Payer: Self-pay | Admitting: Adult Health Nurse Practitioner

## 2023-01-28 DIAGNOSIS — F319 Bipolar disorder, unspecified: Secondary | ICD-10-CM

## 2023-01-28 DIAGNOSIS — F908 Attention-deficit hyperactivity disorder, other type: Secondary | ICD-10-CM | POA: Diagnosis not present

## 2023-01-28 DIAGNOSIS — F428 Other obsessive-compulsive disorder: Secondary | ICD-10-CM | POA: Diagnosis not present

## 2023-01-28 DIAGNOSIS — F411 Generalized anxiety disorder: Secondary | ICD-10-CM

## 2023-01-28 DIAGNOSIS — Z1231 Encounter for screening mammogram for malignant neoplasm of breast: Secondary | ICD-10-CM

## 2023-01-28 MED ORDER — BREXPIPRAZOLE 2 MG PO TABS: ORAL_TABLET | ORAL | 5 refills | Status: DC

## 2023-01-28 MED ORDER — BUPROPION HCL ER (XL) 300 MG PO TB24: 300.0000 mg | ORAL_TABLET | Freq: Every evening | ORAL | 5 refills | Status: DC

## 2023-01-28 MED ORDER — LAMOTRIGINE 100 MG PO TABS: ORAL_TABLET | ORAL | 5 refills | Status: DC

## 2023-01-28 NOTE — Progress Notes (Signed)
Marisa Johnson [id number] 09/05/1976 47 y.o.  Virtual Visit via Video Note  I connected with pt @ on 01/28/23 at  9:40 AM EDT by a video enabled telemedicine application and verified that I am speaking with the correct person using two identifiers.   I discussed the limitations of evaluation and management by telemedicine and the availability of in person appointments. The patient expressed understanding and agreed to proceed.  I discussed the assessment and treatment plan with the patient. The patient was provided an opportunity to ask questions and all were answered. The patient agreed with the plan and demonstrated an understanding of the instructions.   The patient was advised to call back or seek an in-person evaluation if the symptoms worsen or if the condition fails to improve as anticipated.  I provided 25 minutes of non-face-to-face time during this encounter.  The patient was located at home.  The provider was located at [facility].   Dorothyann Gibbs, NP   Subjective:   Patient ID:  Marisa Johnson is a 47 y.o. (DOB May 16, 1976) female.  Chief Complaint: No chief complaint on file.   HPI Marisa Johnson presents for follow-up of ADHD, MDD, GAD, obsessional thoughts, and BPD.  Diagnosed with Bipolar in 2011 - mania. Working on thyroid regulation. Also has chronic fatigue related to Covid.   Referred by PCP - working with TSH regulation - currently.  Patient seen today for medication management.  Describes mood today as "ok". Pleasant. Tearful at times. Mood symptoms - denies depression, anxiety, and irritability. Denies recent panic attacks. Reports some worry, rumination, and over thinking - financial stressors. Denies obsessive thoughts and acts. Mood is consistent. Stating "I feel like I'm doing pretty good". Feels like medications are working well for her. Improved interest and motivation. Taking medications as prescribed.  Energy levels stable. Active,  does not have a regular exercise routine.  Enjoys some usual interests and activities. Divorced. Dating. Has 3 grown children. Spending time with family. Appetite adequate. Weight at a "stand still" without Mounjaro. Still taking Phentermine - 186 pounds. Sleep has been a "little off". Concerned about finances. Focus and concentration improved. Completing tasks. Managing aspects of household. 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 Denies AH or VH. Consumes alcohol - drinks on Saturday. Denies THC use. History of self harm - clean since December 2023.   Currently approved for FMLA for mental health and chronic fatigue.   Previous medication trials: Adderall, Abilify, Ambien Clonazepam, Wellbutrin, Rexulti, Buspar, Lamictal, Lithium, Adderall XR  Review of Systems:  Review of Systems  Musculoskeletal:  Negative for gait problem.  Neurological:  Negative for tremors.  Psychiatric/Behavioral:         Please refer to HPI    Medications: I have reviewed the patient's current medications.  Current Outpatient Medications  Medication Sig Dispense Refill   AIMOVIG 70 MG/ML SOAJ Inject 1 Syringe into the skin every 30 (thirty) days.     amphetamine-dextroamphetamine (ADDERALL XR) 30 MG 24 hr capsule Take 1 capsule (30 mg total) by mouth daily. 30 capsule 0   amphetamine-dextroamphetamine (ADDERALL XR) 30 MG 24 hr capsule Take 1 capsule (30 mg total) by mouth daily. 30 capsule 0   buPROPion (WELLBUTRIN XL) 300 MG 24 hr tablet Take 1 tablet (300 mg total) by mouth every evening. 30 tablet 5   ciprofloxacin (CIPRO) 500 MG tablet Take 1 tablet (500 mg total) by mouth every 12 (twelve) hours. 10 tablet 0   lamoTRIgine (LAMICTAL)  100 MG tablet Take one tablet daily. 30 tablet 5   levocetirizine (XYZAL) 5 MG tablet Take 5 mg by mouth at bedtime.     levothyroxine (SYNTHROID) 100 MCG tablet levothyroxine 137 mcg tablet  TAKE 1 TABLET BY MOUTH EVERY MORNING 30 MINS BEFORE MEALS     megestrol (MEGACE) 40 MG  tablet TAKE 1 TABLET(40 MG) BY MOUTH DAILY 30 tablet 6   phenazopyridine (PYRIDIUM) 100 MG tablet Take 1 tablet (100 mg total) by mouth 3 (three) times daily as needed for pain. 10 tablet 0   promethazine (PHENERGAN) 25 MG tablet Take 25 mg by mouth every 6 (six) hours as needed.      REXULTI 2 MG TABS tablet TAKE 1 TABLET(2 MG) BY MOUTH DAILY 30 tablet 5   tirzepatide (MOUNJARO) 7.5 MG/0.5ML Pen Inject 7.5 mg into the skin once a week.     Ubrogepant (UBRELVY) 100 MG TABS Take by mouth.     zolpidem (AMBIEN) 10 MG tablet Take 10 mg by mouth daily.     No current facility-administered medications for this visit.    Medication Side Effects: None  Allergies:  Allergies  Allergen Reactions   Abilify [Aripiprazole] Other (See Comments)    Reaction:  Restless leg syndrome    Other     Bean Sprouts   Reglan [Metoclopramide] Other (See Comments)    Reaction:  Hallucinations    Triptans Other (See Comments)    Elevated blood pressure    Dexamethasone Rash    Past Medical History:  Diagnosis Date   ADHD    Anxiety    Carpal tunnel syndrome    Chronic fatigue    Connective tissue disorder (HCC)    Contraceptive management 08/09/2015   Decreased libido 08/09/2015   Friable cervix 08/09/2015   History of abnormal cervical Pap smear 08/09/2015   Hypothyroidism    IBS (irritable bowel syndrome)    Irregular intermenstrual bleeding 08/09/2015   Major depressive disorder    Migraine    Mixed connective tissue disease (HCC)    Pseudobulbar affect    PTSD (post-traumatic stress disorder)    Sjogren's syndrome with lung involvement (HCC)    Thyroid disease    Vaginal Pap smear, abnormal     Family History  Problem Relation Age of Onset   Diabetes Mother    Hypertension Mother    Hypothyroidism Mother    Hypertension Father    Stroke Father    Cancer Father        prostate, with metastisis   Cancer Maternal Grandmother        Gaulbladder   Kidney disease Paternal  Grandmother    Hypertension Paternal Grandmother    Hyperlipidemia Paternal Grandmother    Heart disease Paternal Grandmother    Heart attack Paternal Grandmother    Diabetes Daughter    Hypothyroidism Daughter    Raynaud syndrome Daughter    Rheumatologic disease Paternal Grandfather    Anxiety disorder Sister    Alcoholism Sister    Anxiety disorder Brother    Drug abuse Brother    Anxiety disorder Sister    Cancer Maternal Uncle        Lung   Colon cancer Neg Hx     Social History   Socioeconomic History   Marital status: Legally Separated    Spouse name: Not on file   Number of children: 3   Years of education: Not on file   Highest education level: Not on file  Occupational  History   Occupation: Copywriter, 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   Drug use: No   Sexual activity: Yes    Birth control/protection: Surgical    Comment: tubal and ablation  Other Topics Concern   Not on file  Social History Narrative   Not on file   Social Determinants of Health   Financial Resource Strain: Low Risk  (03/13/2022)   Overall Financial Resource Strain (CARDIA)    Difficulty of Paying Living Expenses: Not very hard  Food Insecurity: No Food Insecurity (03/13/2022)   Hunger Vital Sign    Worried About Running Out of Food in the Last Year: Never true    Ran Out of Food in the Last Year: Never true  Transportation Needs: No Transportation Needs (03/13/2022)   PRAPARE - Administrator, Civil Service (Medical): No    Lack of Transportation (Non-Medical): No  Physical Activity: Insufficiently Active (03/13/2022)   Exercise Vital Sign    Days of Exercise per Week: 2 days    Minutes of Exercise per Session: 30 min  Stress: Stress  Concern Present (03/13/2022)   [employer] of Occupational Health - Occupational Stress Questionnaire    Feeling of Stress : To some extent  Social Connections: Moderately Isolated (03/13/2022)   Social Connection and Isolation Panel [NHANES]    Frequency of Communication with Friends and Family: Three times a week    Frequency of Social Gatherings with Friends and Family: Twice a week    Attends Religious Services: 1 to 4 times per year    Active Member of [employer] or Organizations: No    Attends Banker Meetings: Never    Marital Status: Divorced  Catering manager Violence: Not At Risk (03/13/2022)   Humiliation, Afraid, Rape, and Kick questionnaire    Fear of Current or Ex-Partner: No    Emotionally Abused: No    Physically Abused: No    Sexually Abused: No    Past Medical History, Surgical history, Social history, and Family history were reviewed and updated as appropriate.   Please see review of systems for further details on the patient's review from today.   Objective:   Physical Exam:  There were no vitals taken for this visit.  Physical Exam Constitutional:      General: She is not in acute distress. Musculoskeletal:        General: No deformity.  Neurological:     Mental Status: She is alert and oriented to person, place, and time.     Coordination: Coordination normal.  Psychiatric:        Attention and Perception: Attention and perception normal. She does not perceive auditory or visual hallucinations.        Mood and Affect: Mood normal. Mood is not anxious or depressed. Affect is not labile, blunt, angry or inappropriate.        Speech: Speech normal.        Behavior: Behavior normal.        Thought Content: Thought content normal. Thought content is not paranoid or delusional. Thought content does not include homicidal or suicidal ideation. Thought content does not include homicidal or suicidal plan.  Cognition and Memory: Cognition and  memory normal.        Judgment: Judgment normal.     Comments: Insight intact     Lab Review:     Component Value Date/Time   NA 137 04/02/2021 1339   K 3.9 04/02/2021 1339   CL 105 04/02/2021 1339   CO2 24 04/02/2021 1339   GLUCOSE 95 04/02/2021 1339   BUN 10 04/02/2021 1339   CREATININE 1.08 (H) 04/02/2021 1339   CALCIUM 9.3 04/02/2021 1339   PROT 8.0 03/31/2020 1217   ALBUMIN 4.6 03/31/2020 1217   AST 28 03/31/2020 1217   ALT 29 03/31/2020 1217   ALKPHOS 84 03/31/2020 1217   BILITOT 0.8 03/31/2020 1217   GFRNONAA >60 04/02/2021 1339   GFRAA >60 03/31/2020 1217       Component Value Date/Time   WBC 6.3 04/02/2021 1339   RBC 4.40 04/02/2021 1339   HGB 14.0 04/02/2021 1339   HGB 13.5 07/25/2017 1238   HCT 42.9 04/02/2021 1339   HCT 42.4 07/25/2017 1238   PLT 313 04/02/2021 1339   PLT 300 07/25/2017 1238   MCV 97.5 04/02/2021 1339   MCV 91 07/25/2017 1238   MCH 31.8 04/02/2021 1339   MCHC 32.6 04/02/2021 1339   RDW 12.6 04/02/2021 1339   RDW 13.2 07/25/2017 1238   LYMPHSABS 1.9 04/02/2021 1339   LYMPHSABS 4.6 (H) 07/25/2017 1238   MONOABS 0.6 04/02/2021 1339   EOSABS 0.1 04/02/2021 1339   EOSABS 0.2 07/25/2017 1238   BASOSABS 0.0 04/02/2021 1339   BASOSABS 0.0 07/25/2017 1238    No results found for: "POCLITH", "LITHIUM"   No results found for: "PHENYTOIN", "PHENOBARB", "VALPROATE", "CBMZ"   .res Assessment: Plan:    Plan:  PDMP reviewed  Rexulti  daily Lamictal  daily Wellbutrin XL300mg  daily Adderall XR  daily  PCP prescribes Ambien and Clonazepam   Monitor BP between visits while taking stimulant medication.   Will update FMLA paperwork.  Time spent with patient was 25 minutes. Greater than 50% of face to face time with patient was spent on counseling and coordination of care.    RTC 8 weeks  Patient advised to contact office with any questions, adverse effects, or acute worsening in signs and symptoms.  Discussed potential  metabolic side effects associated with atypical antipsychotics, as well as potential risk for movement side effects. Advised pt to contact office if movement side effects occur.    Discussed potential benefits, risks, and side effects of stimulants with patient to include increased heart rate, palpitations, insomnia, increased anxiety, increased irritability, or decreased appetite.  Instructed patient to contact office if experiencing any significant tolerability issues.   There are no diagnoses linked to this encounter.   Please see After Visit Summary for patient specific instructions.  Future Appointments  Date Time Provider Department Center  01/28/2023  9:40 AM Ramal Eckhardt, Thereasa Solo, NP [facility] None    No orders of the defined types were placed in this encounter.     -------------------------------

## 2023-02-10 ENCOUNTER — Other Ambulatory Visit: Payer: Self-pay | Admitting: Adult Health

## 2023-02-10 DIAGNOSIS — F908 Attention-deficit hyperactivity disorder, other type: Secondary | ICD-10-CM

## 2023-02-10 MED ORDER — AMPHETAMINE-DEXTROAMPHET ER 30 MG PO CP24: 30.0000 mg | ORAL_CAPSULE | Freq: Every day | ORAL | 0 refills | Status: DC

## 2023-02-10 NOTE — Telephone Encounter (Signed)
Next appt is 04/01/23. Requesting refill on generic Adderall XR 30 mg called to   [id number] - [place], [place] - [address]   [phone]   Per Shellee Milo needs a new script faxed to the them.

## 2023-02-10 NOTE — Telephone Encounter (Signed)
Pended.

## 2023-02-28 ENCOUNTER — Telehealth: Payer: Self-pay | Admitting: Adult Health

## 2023-02-28 NOTE — Telephone Encounter (Signed)
Pt called for sooner apt. On canc list. Reporting brother passed from OD. She keeps having dreams, he's under her bed and looks like he did in the casket. Contact # [phone] apt 6/18

## 2023-02-28 NOTE — Telephone Encounter (Signed)
Is she working with a therapist?

## 2023-02-28 NOTE — Telephone Encounter (Signed)
Patient reports her brother died of an OD 03/16/23 at age 46. She started having dreams 3 days ago. She wants an appt with you today and has been put on your cancellation list. Discussed how there wasn't a magic pill for grief and that everyone processes it differently. It seemed that she was looking more for an interpretation of her dreams.

## 2023-03-04 NOTE — Telephone Encounter (Signed)
LVM to RC 

## 2023-03-05 NOTE — Telephone Encounter (Signed)
Patient said she was doing much better, not seeing the visions.  She said Marisa Johnson called her late afternoon the day she called.

## 2023-03-12 ENCOUNTER — Ambulatory Visit (HOSPITAL_COMMUNITY): Payer: 59

## 2023-03-19 ENCOUNTER — Ambulatory Visit (HOSPITAL_COMMUNITY)
Admission: RE | Admit: 2023-03-19 | Discharge: 2023-03-19 | Disposition: A | Discharge status: Home / Self Care | Payer: 59 | Source: Ambulatory Visit | Attending: Adult Health Nurse Practitioner | Admitting: Adult Health Nurse Practitioner

## 2023-03-19 DIAGNOSIS — Z1231 Encounter for screening mammogram for malignant neoplasm of breast: Secondary | ICD-10-CM | POA: Diagnosis present

## 2023-03-24 ENCOUNTER — Telehealth: Payer: Self-pay | Admitting: Adult Health

## 2023-03-24 NOTE — Telephone Encounter (Signed)
Pt lvm that she needs her adderall xr 30 mg refilled. Pharmacy is [facility] in [place] on [address]

## 2023-03-25 ENCOUNTER — Other Ambulatory Visit: Payer: Self-pay | Admitting: Adult Health

## 2023-03-25 DIAGNOSIS — F908 Attention-deficit hyperactivity disorder, other type: Secondary | ICD-10-CM

## 2023-03-25 MED ORDER — AMPHETAMINE-DEXTROAMPHET ER 30 MG PO CP24: 30.0000 mg | ORAL_CAPSULE | Freq: Every day | ORAL | 0 refills | Status: DC

## 2023-03-25 NOTE — Telephone Encounter (Signed)
Script sent  

## 2023-04-01 ENCOUNTER — Telehealth (INDEPENDENT_AMBULATORY_CARE_PROVIDER_SITE_OTHER): Payer: 59 | Admitting: Adult Health

## 2023-04-01 ENCOUNTER — Encounter: Payer: Self-pay | Admitting: Adult Health

## 2023-04-01 DIAGNOSIS — F908 Attention-deficit hyperactivity disorder, other type: Secondary | ICD-10-CM

## 2023-04-01 DIAGNOSIS — F428 Other obsessive-compulsive disorder: Secondary | ICD-10-CM

## 2023-04-01 DIAGNOSIS — F319 Bipolar disorder, unspecified: Secondary | ICD-10-CM

## 2023-04-01 DIAGNOSIS — F411 Generalized anxiety disorder: Secondary | ICD-10-CM

## 2023-04-01 MED ORDER — AMPHETAMINE-DEXTROAMPHET ER 30 MG PO CP24: 30.0000 mg | ORAL_CAPSULE | Freq: Every day | ORAL | 0 refills | Status: DC

## 2023-04-01 NOTE — Progress Notes (Signed)
Marisa Johnson [id number] 09-06-1976 47 y.o.  Virtual Visit via Video Note  I connected with pt @ on 04/01/23 at 10:20 AM EDT by a video enabled telemedicine application and verified that I am speaking with the correct person using two identifiers.   I discussed the limitations of evaluation and management by telemedicine and the availability of in person appointments. The patient expressed understanding and agreed to proceed.  I discussed the assessment and treatment plan with the patient. The patient was provided an opportunity to ask questions and all were answered. The patient agreed with the plan and demonstrated an understanding of the instructions.   The patient was advised to call back or seek an in-person evaluation if the symptoms worsen or if the condition fails to improve as anticipated.  I provided 25 minutes of non-face-to-face time during this encounter.  The patient was located at home.  The provider was located at [facility].   Dorothyann Gibbs, NP   Subjective:   Patient ID:  Marisa Johnson is a 47 y.o. (DOB [birth year]) female.  Chief Complaint: No chief complaint on file.   HPI Marisa Johnson presents for follow-up of ADHD, MDD, GAD, obsessional thoughts, and BPD.  Diagnosed with Bipolar in 2011 - mania. Working on thyroid regulation. Also has chronic fatigue related to Covid.   Patient seen today for medication management.  Describes mood today as "ok". Pleasant. Tearful at times. Mood symptoms - reports depression, anxiety, and irritability. Reports losing her brother from an overdose in early May. Reports recent panic attack. Denies worry, rumination, and over thinking. Denies obsessive thoughts and acts. Mood is lower. Stating "I feel like I'm doing ok". Feels like medications are working well for her. Improved interest and motivation. Taking medications as prescribed.  Energy levels lower. Active, does not have a regular exercise routine.  Enjoys some  usual interests and activities. Divorced. Dating. Has 3 grown children. Spending time with family. Appetite adequate. Weight stable - 186 pounds - taking Mounjaro. Also taking Phentermine. Sleep has improved. Averages 8 to 10 hours most nights. Has one night every other week that she stays up all night. Focus and concentration difficulties. Completing tasks. Managing aspects of household. 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 Denies AH or VH. Consumes alcohol - drinks on Saturday. Denies THC use. History of self harm - clean since December 2023.   Currently approved for FMLA for mental health and chronic fatigue.   Previous medication trials: Adderall, Abilify, Ambien Clonazepam, Wellbutrin, Rexulti, Buspar, Lamictal, Lithium, Adderall XR    Review of Systems:  Review of Systems  Musculoskeletal:  Negative for gait problem.  Neurological:  Negative for tremors.  Psychiatric/Behavioral:         Please refer to HPI    Medications: I have reviewed the patient's current medications.  Current Outpatient Medications  Medication Sig Dispense Refill   AIMOVIG 70 MG/ML SOAJ Inject 1 Syringe into the skin every 30 (thirty) days.     amphetamine-dextroamphetamine (ADDERALL XR) 30 MG 24 hr capsule Take 1 capsule (30 mg total) by mouth daily. 30 capsule 0   amphetamine-dextroamphetamine (ADDERALL XR) 30 MG 24 hr capsule Take 1 capsule (30 mg total) by mouth daily. 30 capsule 0   brexpiprazole (REXULTI) 2 MG TABS tablet TAKE 1 TABLET(2 MG) BY MOUTH DAILY 30 tablet 5   buPROPion (WELLBUTRIN XL) 300 MG 24 hr tablet Take 1 tablet (300 mg total)  by mouth every evening. 30 tablet 5   ciprofloxacin (CIPRO) 500 MG tablet Take 1 tablet (500 mg total) by mouth every 12 (twelve) hours. 10 tablet 0   lamoTRIgine (LAMICTAL) 100 MG tablet Take one tablet daily. 30 tablet 5   levocetirizine (XYZAL) 5 MG tablet Take 5 mg by mouth at bedtime.     levothyroxine  (SYNTHROID) 100 MCG tablet levothyroxine 137 mcg tablet  TAKE 1 TABLET BY MOUTH EVERY MORNING 30 MINS BEFORE MEALS     megestrol (MEGACE) 40 MG tablet TAKE 1 TABLET(40 MG) BY MOUTH DAILY 30 tablet 6   phenazopyridine (PYRIDIUM) 100 MG tablet Take 1 tablet (100 mg total) by mouth 3 (three) times daily as needed for pain. 10 tablet 0   promethazine (PHENERGAN) 25 MG tablet Take 25 mg by mouth every 6 (six) hours as needed.      tirzepatide (MOUNJARO) 7.5 MG/0.5ML Pen Inject 7.5 mg into the skin once a week.     Ubrogepant (UBRELVY) 100 MG TABS Take by mouth.     zolpidem (AMBIEN) 10 MG tablet Take 10 mg by mouth daily.     No current facility-administered medications for this visit.    Medication Side Effects: None  Allergies:  Allergies  Allergen Reactions   Abilify [Aripiprazole] Other (See Comments)    Reaction:  Restless leg syndrome    Other     Bean Sprouts   Reglan [Metoclopramide] Other (See Comments)    Reaction:  Hallucinations    Triptans Other (See Comments)    Elevated blood pressure    Dexamethasone Rash    Past Medical History:  Diagnosis Date   ADHD    Anxiety    Carpal tunnel syndrome    Chronic fatigue    Connective tissue disorder (HCC)    Contraceptive management 08/09/2015   Decreased libido 08/09/2015   Friable cervix 08/09/2015   History of abnormal cervical Pap smear 08/09/2015   Hypothyroidism    IBS (irritable bowel syndrome)    Irregular intermenstrual bleeding 08/09/2015   Major depressive disorder    Migraine    Mixed connective tissue disease (HCC)    Pseudobulbar affect    PTSD (post-traumatic stress disorder)    Sjogren's syndrome with lung involvement (HCC)    Thyroid disease    Vaginal Pap smear, abnormal     Family History  Problem Relation Age of Onset   Diabetes Mother    Hypertension Mother    Hypothyroidism Mother    Hypertension Father    Stroke Father    Cancer Father        prostate, with metastisis   Cancer  Maternal Grandmother        Gaulbladder   Kidney disease Paternal Grandmother    Hypertension Paternal Grandmother    Hyperlipidemia Paternal Grandmother    Heart disease Paternal Grandmother    Heart attack Paternal Grandmother    Diabetes Daughter    Hypothyroidism Daughter    Raynaud syndrome Daughter    Rheumatologic disease Paternal Grandfather    Anxiety disorder Sister    Alcoholism Sister    Anxiety disorder Brother    Drug abuse Brother    Anxiety disorder Sister    Cancer Maternal Uncle        Lung   Colon cancer Neg Hx     Social History   Socioeconomic History   Marital status: Legally Separated    Spouse name: Not on file   Number of children: 3  Years of education: Not on file   Highest education level: Not on file  Occupational History   Occupation: Registered Nurse    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    Comment: ED, 12 years  Tobacco Use   Smoking status: Never   Smokeless tobacco: Never   Tobacco comments:    Never smoker  Vaping Use   Vaping Use: Never used  Substance and Sexual Activity   Alcohol use: Yes    Comment: occ.   Drug use: No   Sexual activity: Yes    Birth control/protection: Surgical    Comment: tubal and ablation  Other Topics Concern   Not on file  Social History Narrative   Not on file   Social Determinants of Health   Financial Resource Strain: Low Risk  (03/13/2022)   Overall Financial Resource Strain (CARDIA)    Difficulty of Paying Living Expenses: Not very hard  Food Insecurity: No Food Insecurity (03/13/2022)   Hunger Vital Sign    Worried About Running Out of Food in the Last Year: Never true    Ran Out of Food in the Last Year: Never true  Transportation Needs: No Transportation Needs (03/13/2022)   PRAPARE - Administrator, Civil Service (Medical): No    Lack of Transportation (Non-Medical): No  Physical Activity: Insufficiently Active (03/13/2022)   Exercise Vital Sign    Days of Exercise per Week: 2  days    Minutes of Exercise per Session: 30 min  Stress: Stress Concern Present (03/13/2022)   [employer] of Occupational Health - Occupational Stress Questionnaire    Feeling of Stress : To some extent  Social Connections: Moderately Isolated (03/13/2022)   Social Connection and Isolation Panel [NHANES]    Frequency of Communication with Friends and Family: Three times a week    Frequency of Social Gatherings with Friends and Family: Twice a week    Attends Religious Services: 1 to 4 times per year    Active Member of [employer] or Organizations: No    Attends Banker Meetings: Never    Marital Status: Divorced  Catering manager Violence: Not At Risk (03/13/2022)   Humiliation, Afraid, Rape, and Kick questionnaire    Fear of Current or Ex-Partner: No    Emotionally Abused: No    Physically Abused: No    Sexually Abused: No    Past Medical History, Surgical history, Social history, and Family history were reviewed and updated as appropriate.   Please see review of systems for further details on the patient's review from today.   Objective:   Physical Exam:  There were no vitals taken for this visit.  Physical Exam Constitutional:      General: She is not in acute distress. Musculoskeletal:        General: No deformity.  Neurological:     Mental Status: She is alert and oriented to person, place, and time.     Coordination: Coordination normal.  Psychiatric:        Attention and Perception: Attention and perception normal. She does not perceive auditory or visual hallucinations.        Mood and Affect: Mood normal. Mood is not anxious or depressed. Affect is not labile, blunt, angry or inappropriate.        Speech: Speech normal.        Behavior: Behavior normal.        Thought Content: Thought content normal. Thought content is not paranoid or delusional. Thought content does not  include homicidal or suicidal ideation. Thought content does not include homicidal  or suicidal plan.        Cognition and Memory: Cognition and memory normal.        Judgment: Judgment normal.     Comments: Insight intact     Lab Review:     Component Value Date/Time   NA 137 04/02/2021 1339   K 3.9 04/02/2021 1339   CL 105 04/02/2021 1339   CO2 24 04/02/2021 1339   GLUCOSE 95 04/02/2021 1339   BUN 10 04/02/2021 1339   CREATININE 1.08 (H) 04/02/2021 1339   CALCIUM 9.3 04/02/2021 1339   PROT 8.0 03/31/2020 1217   ALBUMIN 4.6 03/31/2020 1217   AST 28 03/31/2020 1217   ALT 29 03/31/2020 1217   ALKPHOS 84 03/31/2020 1217   BILITOT 0.8 03/31/2020 1217   GFRNONAA >60 04/02/2021 1339   GFRAA >60 03/31/2020 1217       Component Value Date/Time   WBC 6.3 04/02/2021 1339   RBC 4.40 04/02/2021 1339   HGB 14.0 04/02/2021 1339   HGB 13.5 07/25/2017 1238   HCT 42.9 04/02/2021 1339   HCT 42.4 07/25/2017 1238   PLT 313 04/02/2021 1339   PLT 300 07/25/2017 1238   MCV 97.5 04/02/2021 1339   MCV 91 07/25/2017 1238   MCH 31.8 04/02/2021 1339   MCHC 32.6 04/02/2021 1339   RDW 12.6 04/02/2021 1339   RDW 13.2 07/25/2017 1238   LYMPHSABS 1.9 04/02/2021 1339   LYMPHSABS 4.6 (H) 07/25/2017 1238   MONOABS 0.6 04/02/2021 1339   EOSABS 0.1 04/02/2021 1339   EOSABS 0.2 07/25/2017 1238   BASOSABS 0.0 04/02/2021 1339   BASOSABS 0.0 07/25/2017 1238    No results found for: "POCLITH", "LITHIUM"   No results found for: "PHENYTOIN", "PHENOBARB", "VALPROATE", "CBMZ"   .res Assessment: Plan:    Plan:  PDMP reviewed  Rexulti 2mg  daily Lamictal 100mg  daily Wellbutrin XL300mg  daily Adderall XR 30mg  daily  PCP prescribes Ambien and Clonazepam   Monitor BP between visits while taking stimulant medication. 120/80  FMLA paperwork completed.  Time spent with patient was 25 minutes. Greater than 50% of face to face time with patient was spent on counseling and coordination of care.    RTC 6 weeks  Patient advised to contact office with any questions, adverse  effects, or acute worsening in signs and symptoms.  Discussed potential metabolic side effects associated with atypical antipsychotics, as well as potential risk for movement side effects. Advised pt to contact office if movement side effects occur.    Discussed potential benefits, risks, and side effects of stimulants with patient to include increased heart rate, palpitations, insomnia, increased anxiety, increased irritability, or decreased appetite. Instructed patient to contact office if experiencing any significant tolerability issues.   There are no diagnoses linked to this encounter.   Please see After Visit Summary for patient specific instructions.  No future appointments.  No orders of the defined types were placed in this encounter.     -------------------------------

## 2023-04-22 ENCOUNTER — Ambulatory Visit: Payer: 59 | Admitting: Adult Health

## 2023-05-13 ENCOUNTER — Ambulatory Visit (INDEPENDENT_AMBULATORY_CARE_PROVIDER_SITE_OTHER): Payer: Self-pay | Admitting: Adult Health

## 2023-05-13 DIAGNOSIS — Z0389 Encounter for observation for other suspected diseases and conditions ruled out: Secondary | ICD-10-CM

## 2023-05-13 NOTE — Progress Notes (Signed)
Patient no show appointment. ? ?

## 2023-05-21 ENCOUNTER — Ambulatory Visit (INDEPENDENT_AMBULATORY_CARE_PROVIDER_SITE_OTHER): Payer: 59

## 2023-05-21 ENCOUNTER — Ambulatory Visit
Admission: EM | Admit: 2023-05-21 | Discharge: 2023-05-21 | Disposition: A | Discharge status: Home / Self Care | Payer: 59 | Attending: Nurse Practitioner | Admitting: Nurse Practitioner

## 2023-05-21 DIAGNOSIS — M25571 Pain in right ankle and joints of right foot: Secondary | ICD-10-CM

## 2023-05-21 DIAGNOSIS — S93401A Sprain of unspecified ligament of right ankle, initial encounter: Secondary | ICD-10-CM

## 2023-05-21 NOTE — Discharge Instructions (Addendum)
The x-ray is negative for fracture or dislocation. You have been provided an ankle brace to provide compression and support of the right ankle.  Also recommend wearing shoes with good insole and support. May take over-the-counter Tylenol or ibuprofen as needed for pain or discomfort. Gentle range of motion exercises to help improve mobility of the joint.  I have provided some exercises for you to do at home. Apply ice to the right foot/ankle to help with pain or swelling.  Apply for 20 minutes, remove for 1 hour, repeat as needed. If symptoms are not improving over the next 2 to 3 weeks, or if symptoms appear to be worsening, please follow-up with orthopedics for further evaluation.  You can follow-up with [facility] or with [facility]. Follow-up as needed.

## 2023-05-21 NOTE — ED Triage Notes (Signed)
[place] c[place] and stepped too soon and felt a pop. Stiffness and swelling. Radiating behind the knee

## 2023-05-21 NOTE — ED Provider Notes (Signed)
[facility]    CSN: [id number] Arrival date & time: 05/21/23  1557      History   Chief Complaint No chief complaint on file.   HPI Marisa Johnson is a 47 y.o. female.   The history is provided by the patient.   Patient presents for complaints of right ankle pain that started 1 day ago after she was stepping [place].  She states when she stepped down, she must of stepped "wrong" and felt a "pop" across the right ankle immediately above the right foot.  Patient states that she has pain with ambulation, and has intermittent radiation of pain from the right ankle to the posterior aspect of the right lower leg up to the posterior knee.  She denies swelling, inability to bear weight.  She reports she has had prior injuries, including fractures, to the right foot and ankle.  Past Medical History:  Diagnosis Date   ADHD    Anxiety    Carpal tunnel syndrome    Chronic fatigue    Connective tissue disorder (HCC)    Contraceptive management 08/09/2015   Decreased libido 08/09/2015   Friable cervix 08/09/2015   History of abnormal cervical Pap smear 08/09/2015   Hypothyroidism    IBS (irritable bowel syndrome)    Irregular intermenstrual bleeding 08/09/2015   Major depressive disorder    Migraine    Mixed connective tissue disease (HCC)    Pseudobulbar affect    PTSD (post-traumatic stress disorder)    Sjogren's syndrome with lung involvement (HCC)    Thyroid disease    Vaginal Pap smear, abnormal     Patient Active Problem List   Diagnosis Date Noted   Encounter for well woman exam with routine gynecological exam 03/13/2022   Encounter for screening fecal occult blood testing 06/27/2020   Encounter for gynecological examination with Papanicolaou smear of cervix 06/27/2020   Chronic migraine without aura, with status migrainosus 03/14/2020   Closed fracture of lower end of left radius with routine healing 12/16/2019   Routine screening  for STI (sexually transmitted infection) 10/06/2019   Cervicitis and endocervicitis 02/11/2018   History of abortion 07/25/2017   Endometritis 07/25/2017   Pelvic pain 07/25/2017   Dyspnea 05/03/2017   SOB (shortness of breath) 05/03/2017   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   Sjogren's syndrome with lung involvement (HCC) 03/15/2017   Irregular intermenstrual bleeding 08/09/2015   Decreased libido 08/09/2015   Friable cervix 08/09/2015   Contraceptive management 08/09/2015   History of abnormal cervical Pap smear 08/09/2015   Abdominal pain, left lower quadrant 09/21/2013   Nausea and vomiting 09/20/2013   Irritable bowel syndrome 05/29/2010   CLOSED FRACTURE OF HEAD OF RADIUS 04/13/2008   FX CLOSED FIBULA NOS 04/13/2008    Past Surgical History:  Procedure Laterality Date   ABLATION     DILATION AND CURETTAGE OF UTERUS N/A 07/25/2017   Procedure: SUCTION DILATATION AND CURETTAGE;  Surgeon: Tilda Burrow, MD;  Location: [facility];  Service: Gynecology;  Laterality: N/A;   DILITATION & CURRETTAGE/HYSTROSCOPY WITH NOVASURE ABLATION N/A 09/30/2017   Procedure: DILATATION, HYSTEROSCOPY WITH NOVASURE ENDOMETRIAL ABLATION;  Surgeon: Tilda Burrow, MD;  Location: [facility];  Service: Gynecology;  Laterality: N/A;   ESOPHAGOGASTRODUODENOSCOPY N/A 11/17/2016   Procedure: ESOPHAGOGASTRODUODENOSCOPY (EGD);  Surgeon: Malissa Hippo, MD;  Location: [facility];  Service: Endoscopy;  Laterality: N/A;   LAPAROSCOPIC BILATERAL SALPINGECTOMY Bilateral 09/30/2017   Procedure: LAPAROSCOPIC BILATERAL  SALPINGECTOMY;  Surgeon: Tilda Burrow, MD;  Location: [facility];  Service: Gynecology;  Laterality: Bilateral;   None      OB History     Gravida  5   Para  3   Term  3   Preterm      AB  2   Living  3      SAB  1   IAB  1   Ectopic      Multiple      Live Births               Home Medications    Prior to Admission medications   Medication Sig Start Date End Date  Taking? Authorizing Provider  AIMOVIG 70 MG/ML SOAJ Inject 1 Syringe into the skin every 30 (thirty) days. 08/16/19   [provider]  amphetamine-dextroamphetamine (ADDERALL XR) 30 MG 24 hr capsule Take 1 capsule (30 mg total) by mouth daily. 04/01/23   Mozingo, Thereasa Solo, NP  amphetamine-dextroamphetamine (ADDERALL XR) 30 MG 24 hr capsule Take 1 capsule (30 mg total) by mouth daily. 04/29/23   Mozingo, Thereasa Solo, NP  brexpiprazole (REXULTI) 2 MG TABS tablet TAKE 1 TABLET(2 MG) BY MOUTH DAILY 01/28/23   Mozingo, Thereasa Solo, NP  buPROPion (WELLBUTRIN XL) 300 MG 24 hr tablet Take 1 tablet (300 mg total) by mouth every evening. 01/28/23   Mozingo, Thereasa Solo, NP  ciprofloxacin (CIPRO) 500 MG tablet Take 1 tablet (500 mg total) by mouth every 12 (twelve) hours. 07/07/22   Tomi Bamberger, PA-C  lamoTRIgine (LAMICTAL) 100 MG tablet Take one tablet daily. 01/28/23   Mozingo, Thereasa Solo, NP  levocetirizine (XYZAL) 5 MG tablet Take 5 mg by mouth at bedtime. 09/13/19   [provider]  levothyroxine (SYNTHROID) 100 MCG tablet levothyroxine 137 mcg tablet  TAKE 1 TABLET BY MOUTH EVERY MORNING 30 MINS BEFORE MEALS    [provider]  megestrol (MEGACE) 40 MG tablet TAKE 1 TABLET(40 MG) BY MOUTH DAILY 10/15/22   [name], NP  phenazopyridine (PYRIDIUM) 100 MG tablet Take 1 tablet (100 mg total) by mouth 3 (three) times daily as needed for pain. 07/07/22   Tomi Bamberger, PA-C  promethazine (PHENERGAN) 25 MG tablet Take 25 mg by mouth every 6 (six) hours as needed.  03/25/20   [provider]  tirzepatide Greggory Keen) 7.5 MG/0.5ML Pen Inject 7.5 mg into the skin once a week.    [provider]  Ubrogepant (UBRELVY) 100 MG TABS Take by mouth.    [provider]  zolpidem (AMBIEN) 10 MG tablet Take 10 mg by mouth daily. 03/14/20   [provider]    Family History Family History  Problem Relation Age of Onset   Diabetes  Mother    Hypertension Mother    Hypothyroidism Mother    Hypertension Father    Stroke Father    Cancer Father        prostate, with metastisis   Cancer Maternal Grandmother        Gaulbladder   Kidney disease Paternal Grandmother    Hypertension Paternal Grandmother    Hyperlipidemia Paternal Grandmother    Heart disease Paternal Grandmother    Heart attack Paternal Grandmother    Diabetes Daughter    Hypothyroidism Daughter    Raynaud syndrome Daughter    Rheumatologic disease Paternal Grandfather    Anxiety disorder Sister    Alcoholism Sister    Anxiety disorder Brother    Drug  abuse Brother    Anxiety disorder Sister    Cancer Maternal Uncle        Lung   Colon cancer Neg Hx     Social History Social History   Tobacco Use   Smoking status: Never   Smokeless tobacco: Never   Tobacco comments:    Never smoker  Vaping Use   Vaping status: Never Used  Substance Use Topics   Alcohol use: Yes    Comment: occ.   Drug use: No     Allergies   Abilify [aripiprazole], Other, Reglan [metoclopramide], Triptans, and Dexamethasone   Review of Systems Review of Systems Per HPI  Physical Exam Triage Vital Signs ED Triage Vitals  Encounter Vitals Group     BP 05/21/23 1635 132/83     Systolic BP Percentile --      Diastolic BP Percentile --      Pulse Rate 05/21/23 1635 90     Resp 05/21/23 1635 13     Temp 05/21/23 1635 98.1 F (36.7 C)     Temp Source 05/21/23 1635 Oral     SpO2 05/21/23 1635 95 %     Weight --      Height --      Head Circumference --      Peak Flow --      Pain Score 05/21/23 1636 8     Pain Loc --      Pain Education --      Exclude from Growth Chart --    No data found.  Updated Vital Signs BP 132/83   Pulse 90   Temp 98.1 F (36.7 C) (Oral)   Resp 13   SpO2 95%   Visual Acuity Right Eye Distance:   Left Eye Distance:   Bilateral Distance:    Right Eye Near:   Left Eye Near:    Bilateral Near:     Physical  Exam Vitals and nursing note reviewed.  Constitutional:      General: She is not in acute distress.    Appearance: Normal appearance.  Eyes:     Extraocular Movements: Extraocular movements intact.     Pupils: Pupils are equal, round, and reactive to light.  Pulmonary:     Effort: Pulmonary effort is normal.  Musculoskeletal:     Cervical back: Normal range of motion.     Right lower leg: Normal.     Right ankle: No swelling. Tenderness (Over the talus bone) present over the medial malleolus. Decreased range of motion (Increased pain with flexion). Normal pulse.     Right foot: Normal.  Skin:    General: Skin is warm and dry.  Neurological:     General: No focal deficit present.     Mental Status: She is alert and oriented to person, place, and time.  Psychiatric:        Mood and Affect: Mood normal.        Behavior: Behavior normal.      UC Treatments / Results  Labs (all labs ordered are listed, but only abnormal results are displayed) Labs Reviewed - No data to display  EKG   Radiology DG Ankle Complete Right  Result Date: 05/21/2023 CLINICAL DATA:  Fall, right ankle pain EXAM: RIGHT ANKLE - COMPLETE 3+ VIEW COMPARISON:  None Available. FINDINGS: No fracture or dislocation is seen. The ankle mortise is intact. The base of the fifth metatarsal is unremarkable. Mild dorsal soft tissue swelling. IMPRESSION: No fracture or dislocation is  seen. Mild dorsal soft tissue swelling. Electronically Signed   By: Charline Bills M.D.   On: 05/21/2023 17:15    Procedures Procedures (including critical care time)  Medications Ordered in UC Medications - No data to display  Initial Impression / Assessment and Plan / UC Course  I have reviewed the triage vital signs and the nursing notes.  Pertinent labs & imaging results that were available during my care of the patient were reviewed by me and considered in my medical decision making (see chart for details).  The patient is  well-appearing, she is in no acute distress, vital signs were stable.  X-ray of the right ankle is negative for fracture or dislocation.  Patient does have mild dorsal soft tissue swelling seen on her x-ray.  Lace up ankle brace was provided to provide compression and support.  Supportive care recommendations were provided and discussed with the patient to include over-the-counter analgesics for pain or discomfort, RICE therapy, and range of motion exercises.  Patient was advised that if symptoms are not improving over the next 2 to 3 weeks, recommend following up with orthopedics for further evaluation, or sooner if symptoms worsen.  Patient was given information for Ortho care rates fall and for [facility].  Patient is in agreement with this plan of care and verbalizes understanding.  All questions were answered.  Patient stable for discharge.   Final Clinical Impressions(s) / UC Diagnoses   Final diagnoses:  Right ankle pain, unspecified chronicity  Sprain of right ankle, unspecified ligament, initial encounter     Discharge Instructions      The x-ray is negative for fracture or dislocation. You have been provided an ankle brace to provide compression and support of the right ankle.  Also recommend wearing shoes with good insole and support. May take over-the-counter Tylenol or ibuprofen as needed for pain or discomfort. Gentle range of motion exercises to help improve mobility of the joint.  I have provided some exercises for you to do at home. Apply ice to the right foot/ankle to help with pain or swelling.  Apply for 20 minutes, remove for 1 hour, repeat as needed. If symptoms are not improving over the next 2 to 3 weeks, or if symptoms appear to be worsening, please follow-up with orthopedics for further evaluation.  You can follow-up with [facility] [place] or with [facility]. Follow-up as needed.      ED Prescriptions   None    PDMP not reviewed this encounter.    Abran Cantor, NP 05/21/23 1725

## 2023-06-03 ENCOUNTER — Encounter: Payer: Self-pay | Admitting: Adult Health

## 2023-06-03 ENCOUNTER — Telehealth (INDEPENDENT_AMBULATORY_CARE_PROVIDER_SITE_OTHER): Payer: 59 | Admitting: Adult Health

## 2023-06-03 DIAGNOSIS — F331 Major depressive disorder, recurrent, moderate: Secondary | ICD-10-CM | POA: Diagnosis not present

## 2023-06-03 DIAGNOSIS — F428 Other obsessive-compulsive disorder: Secondary | ICD-10-CM | POA: Diagnosis not present

## 2023-06-03 DIAGNOSIS — F908 Attention-deficit hyperactivity disorder, other type: Secondary | ICD-10-CM

## 2023-06-03 DIAGNOSIS — F411 Generalized anxiety disorder: Secondary | ICD-10-CM

## 2023-06-03 MED ORDER — AMPHETAMINE-DEXTROAMPHET ER 30 MG PO CP24: 30.0000 mg | ORAL_CAPSULE | Freq: Every day | ORAL | 0 refills | Status: DC

## 2023-06-03 NOTE — Progress Notes (Addendum)
[name] [id number] 06-Jul-1976 47 y.o.  Subjective:   Patient ID:  Marisa Johnson is a 47 y.o. (DOB 24-Sep-1976) female.  Chief Complaint: No chief complaint on file.   HPI [name] presents to the office today for follow-up of ADHD, MDD, GAD, obsessional thoughts, and BPD.  Diagnosed with Bipolar in 2011 - mania. Working on thyroid regulation. Also has chronic fatigue related to Covid.   Patient seen today for medication management.  Describes mood today as "ok". Pleasant. Denies tearfulness. Mood symptoms - reports anxiety. Denies depression and irritability. Denies recent panic attacks. Reports some worry, rumination, and over thinking. Denies obsessive thoughts and acts. Mood is more consistent. Stating "I feel like I'm doing good". Feels like medications are working well for her. Improved interest and motivation. Taking medications as prescribed.  Energy levels "ok". Active, does not have a regular exercise routine.  Enjoys some usual interests and activities. Divorced. Dating. Has 3 grown children. Daughter at [employer] - son senior at [employer]. Spending time with family. Oldest daughter graduated with Master's - starting a new job. Appetite adequate. Weight gain - 186 to 196 pounds - taking Mounjaro. Also taking Phentermine. Sleep has improved. Averages 8 to 10 hours most nights.  Focus and concentration difficulties. Completing tasks. Managing aspects of household. 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 Denies AH or VH. Consumes alcohol - drinks on Saturday. Denies THC use. History of self harm - clean since December 2023.   Reports losing her brother from an overdose in early May.  Currently approved for FMLA for mental health and chronic fatigue.   Previous medication trials: Adderall, Abilify, Ambien Clonazepam, Wellbutrin, Rexulti, Buspar, Lamictal, Lithium, Adderall XR     AUDIT    Flowsheet Row Office Visit from 06/27/2020 in [facility] for [facility]'s  Healthcare at [facility]  Alcohol Use Disorder Identification Test Final Score (AUDIT) 4      GAD-7    Flowsheet Row Office Visit from 03/13/2022 in [facility] for [facility] Healthcare at [facility] Office Visit from 06/27/2020 in [facility] for Women's Healthcare at [facility]  Total GAD-7 Score 6 0      PHQ2-9    Flowsheet Row Office Visit from 03/13/2022 in [facility] for [facility] Healthcare at [facility] Office Visit from 06/27/2020 in [facility] for Women's Healthcare at [facility]  PHQ-2 Total Score 2 0  PHQ-9 Total Score 8 0      Flowsheet Row ED from 05/21/2023 in [facility] at [facility] ED from 07/07/2022 in [facility] at [facility] ED from 07/13/2021 in [facility] at [facility] RISK CATEGORY No Risk No Risk No Risk        Review of Systems:  Review of Systems  Musculoskeletal:  Negative for gait problem.  Neurological:  Negative for tremors.  Psychiatric/Behavioral:         Please refer to HPI    Medications: I have reviewed the patient's current medications.  Current Outpatient Medications  Medication Sig Dispense Refill   AIMOVIG 70 MG/ML SOAJ Inject 1 Syringe into the skin every 30 (thirty) days.     amphetamine-dextroamphetamine (ADDERALL XR) 30 MG 24 hr capsule Take 1 capsule (30 mg total) by mouth daily. 30 capsule 0   [START ON 07/01/2023] amphetamine-dextroamphetamine (ADDERALL XR) 30 MG 24 hr capsule Take 1 capsule (30 mg total) by mouth daily. 30 capsule 0   brexpiprazole (REXULTI)  2 MG TABS tablet TAKE 1 TABLET(2 MG) BY MOUTH DAILY 30 tablet 5   buPROPion (WELLBUTRIN XL) 300 MG 24 hr tablet Take 1 tablet (300 mg total) by mouth every evening. 30 tablet 5   ciprofloxacin (CIPRO) 500 MG tablet Take 1 tablet (500 mg total) by mouth every 12 (twelve) hours. 10 tablet 0   lamoTRIgine (LAMICTAL) 100 MG tablet Take one tablet daily. 30 tablet 5   levocetirizine (XYZAL) 5 MG tablet Take  5 mg by mouth at bedtime.     levothyroxine (SYNTHROID) 100 MCG tablet levothyroxine 137 mcg tablet  TAKE 1 TABLET BY MOUTH EVERY MORNING 30 MINS BEFORE MEALS     megestrol (MEGACE) 40 MG tablet TAKE 1 TABLET(40 MG) BY MOUTH DAILY 30 tablet 6   phenazopyridine (PYRIDIUM) 100 MG tablet Take 1 tablet (100 mg total) by mouth 3 (three) times daily as needed for pain. 10 tablet 0   promethazine (PHENERGAN) 25 MG tablet Take 25 mg by mouth every 6 (six) hours as needed.      tirzepatide (MOUNJARO) 7.5 MG/0.5ML Pen Inject 7.5 mg into the skin once a week.     Ubrogepant (UBRELVY) 100 MG TABS Take by mouth.     zolpidem (AMBIEN) 10 MG tablet Take 10 mg by mouth daily.     No current facility-administered medications for this visit.    Medication Side Effects: None  Allergies:  Allergies  Allergen Reactions   Abilify [Aripiprazole] Other (See Comments)    Reaction:  Restless leg syndrome    Other     Bean Sprouts   Reglan [Metoclopramide] Other (See Comments)    Reaction:  Hallucinations    Triptans Other (See Comments)    Elevated blood pressure    Dexamethasone Rash    Past Medical History:  Diagnosis Date   ADHD    Anxiety    Carpal tunnel syndrome    Chronic fatigue    Connective tissue disorder (HCC)    Contraceptive management 08/09/2015   Decreased libido 08/09/2015   Friable cervix 08/09/2015   History of abnormal cervical Pap smear 08/09/2015   Hypothyroidism    IBS (irritable bowel syndrome)    Irregular intermenstrual bleeding 08/09/2015   Major depressive disorder    Migraine    Mixed connective tissue disease (HCC)    Pseudobulbar affect    PTSD (post-traumatic stress disorder)    Sjogren's syndrome with lung involvement (HCC)    Thyroid disease    Vaginal Pap smear, abnormal     Past Medical History, Surgical history, Social history, and Family history were reviewed and updated as appropriate.   Please see review of systems for further details on the  patient's review from today.   Objective:   Physical Exam:  There were no vitals taken for this visit.  Physical Exam Constitutional:      General: She is not in acute distress. Musculoskeletal:        General: No deformity.  Neurological:     Mental Status: She is alert and oriented to person, place, and time.     Coordination: Coordination normal.  Psychiatric:        Attention and Perception: Attention and perception normal. She does not perceive auditory or visual hallucinations.        Mood and Affect: Affect is not labile, blunt, angry or inappropriate.        Speech: Speech normal.        Behavior: Behavior normal.  Thought Content: Thought content normal. Thought content is not paranoid or delusional. Thought content does not include homicidal or suicidal ideation. Thought content does not include homicidal or suicidal plan.        Cognition and Memory: Cognition and memory normal.        Judgment: Judgment normal.     Comments: Insight intact     Lab Review:     Component Value Date/Time   NA 137 04/02/2021 1339   K 3.9 04/02/2021 1339   CL 105 04/02/2021 1339   CO2 24 04/02/2021 1339   GLUCOSE 95 04/02/2021 1339   BUN 10 04/02/2021 1339   CREATININE 1.08 (H) 04/02/2021 1339   CALCIUM 9.3 04/02/2021 1339   PROT 8.0 03/31/2020 1217   ALBUMIN 4.6 03/31/2020 1217   AST 28 03/31/2020 1217   ALT 29 03/31/2020 1217   ALKPHOS 84 03/31/2020 1217   BILITOT 0.8 03/31/2020 1217   GFRNONAA >60 04/02/2021 1339   GFRAA >60 03/31/2020 1217       Component Value Date/Time   WBC 6.3 04/02/2021 1339   RBC 4.40 04/02/2021 1339   HGB 14.0 04/02/2021 1339   HGB 13.5 07/25/2017 1238   HCT 42.9 04/02/2021 1339   HCT 42.4 07/25/2017 1238   PLT 313 04/02/2021 1339   PLT 300 07/25/2017 1238   MCV 97.5 04/02/2021 1339   MCV 91 07/25/2017 1238   MCH 31.8 04/02/2021 1339   MCHC 32.6 04/02/2021 1339   RDW 12.6 04/02/2021 1339   RDW 13.2 07/25/2017 1238   LYMPHSABS 1.9  04/02/2021 1339   LYMPHSABS 4.6 (H) 07/25/2017 1238   MONOABS 0.6 04/02/2021 1339   EOSABS 0.1 04/02/2021 1339   EOSABS 0.2 07/25/2017 1238   BASOSABS 0.0 04/02/2021 1339   BASOSABS 0.0 07/25/2017 1238    No results found for: "POCLITH", "LITHIUM"   No results found for: "PHENYTOIN", "PHENOBARB", "VALPROATE", "CBMZ"   .res Assessment: Plan:    Plan:  PDMP reviewed  Rexulti 2mg  daily Lamictal 100mg  daily Wellbutrin XL300mg  daily Adderall XR 30mg  daily  PCP prescribes Ambien and Clonazepam   Monitor BP between visits while taking stimulant medication. 120/80  FMLA paperwork completed.  Time spent with patient was 25 minutes. Greater than 50% of face to face time with patient was spent on counseling and coordination of care.    RTC 6 weeks  Patient advised to contact office with any questions, adverse effects, or acute worsening in signs and symptoms.  Discussed potential metabolic side effects associated with atypical antipsychotics, as well as potential risk for movement side effects. Advised pt to contact office if movement side effects occur.    Discussed potential benefits, risks, and side effects of stimulants with patient to include increased heart rate, palpitations, insomnia, increased anxiety, increased irritability, or decreased appetite. Instructed patient to contact office if experiencing any significant tolerability issues.   Diagnoses and all orders for this visit:  Major depressive disorder, recurrent episode, moderate (HCC)  Attention deficit hyperactivity disorder (ADHD), other type -     amphetamine-dextroamphetamine (ADDERALL XR) 30 MG 24 hr capsule; Take 1 capsule (30 mg total) by mouth daily. -     amphetamine-dextroamphetamine (ADDERALL XR) 30 MG 24 hr capsule; Take 1 capsule (30 mg total) by mouth daily.  Generalized anxiety disorder  Obsessional thoughts     Please see After Visit Summary for patient specific instructions.  No future  appointments.   No orders of the defined types were placed in this encounter.   -------------------------------

## 2023-06-23 ENCOUNTER — Other Ambulatory Visit: Payer: Self-pay | Admitting: Adult Health

## 2023-07-20 ENCOUNTER — Other Ambulatory Visit: Payer: Self-pay | Admitting: Adult Health

## 2023-07-20 DIAGNOSIS — F319 Bipolar disorder, unspecified: Secondary | ICD-10-CM

## 2023-08-05 ENCOUNTER — Ambulatory Visit: Payer: 59 | Admitting: Adult Health

## 2023-08-06 ENCOUNTER — Encounter: Payer: Self-pay | Admitting: Adult Health

## 2023-08-06 ENCOUNTER — Telehealth: Payer: 59 | Admitting: Adult Health

## 2023-08-06 DIAGNOSIS — F411 Generalized anxiety disorder: Secondary | ICD-10-CM | POA: Diagnosis not present

## 2023-08-06 DIAGNOSIS — F319 Bipolar disorder, unspecified: Secondary | ICD-10-CM | POA: Diagnosis not present

## 2023-08-06 DIAGNOSIS — F908 Attention-deficit hyperactivity disorder, other type: Secondary | ICD-10-CM

## 2023-08-06 DIAGNOSIS — F428 Other obsessive-compulsive disorder: Secondary | ICD-10-CM

## 2023-08-06 DIAGNOSIS — F909 Attention-deficit hyperactivity disorder, unspecified type: Secondary | ICD-10-CM | POA: Diagnosis not present

## 2023-08-06 MED ORDER — AMPHETAMINE-DEXTROAMPHET ER 30 MG PO CP24: 30.0000 mg | ORAL_CAPSULE | Freq: Every day | ORAL | 0 refills | Status: DC

## 2023-08-06 MED ORDER — AMPHETAMINE-DEXTROAMPHETAMINE 10 MG PO TABS: 10.0000 mg | ORAL_TABLET | Freq: Every day | ORAL | 0 refills | Status: DC

## 2023-08-06 MED ORDER — BREXPIPRAZOLE 2 MG PO TABS: ORAL_TABLET | ORAL | 5 refills | Status: DC

## 2023-08-06 MED ORDER — BUPROPION HCL ER (XL) 300 MG PO TB24: 300.0000 mg | ORAL_TABLET | Freq: Every evening | ORAL | 5 refills | Status: DC

## 2023-08-06 MED ORDER — LAMOTRIGINE 100 MG PO TABS: ORAL_TABLET | ORAL | 5 refills | Status: DC

## 2023-08-06 NOTE — Progress Notes (Signed)
Marisa Johnson [id number] Jul 21, 1976 47 y.o.  Virtual Visit via Video Note  I connected with pt @ on 08/06/23 at 10:40 AM EDT by a video enabled telemedicine application and verified that I am speaking with the correct person using two identifiers.   I discussed the limitations of evaluation and management by telemedicine and the availability of in person appointments. The patient expressed understanding and agreed to proceed.  I discussed the assessment and treatment plan with the patient. The patient was provided an opportunity to ask questions and all were answered. The patient agreed with the plan and demonstrated an understanding of the instructions.   The patient was advised to call back or seek an in-person evaluation if the symptoms worsen or if the condition fails to improve as anticipated.  I provided 25 minutes of non-face-to-face time during this encounter.  The patient was located at home.  The provider was located at [facility].   Dorothyann Gibbs, NP   Subjective:   Patient ID:  Marisa Johnson is a 47 y.o. (DOB [birth year]) female.  Chief Complaint: No chief complaint on file.   HPI Marisa Johnson presents for follow-up of ADHD, MDD, GAD, obsessional thoughts, and BPD.  Diagnosed with Bipolar in 2011 - mania. Working on thyroid regulation. Also has chronic fatigue related to Covid.   Patient seen today for medication management.  Describes mood today as "ok". Pleasant. Denies tearfulness. Mood symptoms - denies anxiety, depression and irritability. Denies recent panic attacks. Reports some worry, rumination, and over thinking. Denies obsessive thoughts and acts. Mood is consistent. Stating "I feel like I'm doing alright". Feels like medications are working well for her. Stable interest and motivation. Taking medications as prescribed.  Energy levels "ok". Active, does not have a regular exercise routine.  Enjoys some usual interests and activities. Divorced.  Dating. Has 3 grown children. Spending time with family.  Appetite adequate. Weight stable - 190 pounds - taking Mounjaro. Also taking Phentermine - plans to d/c. Sleep has improved. Averages 8 to 10 hours most nights.  Focus and concentration difficulties - more so in the afternoons. Completing tasks. Managing aspects of household. 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 Denies AH or VH. Consumes alcohol - has a few drinks on Saturday. Denies THC use. History of self harm - clean since December 2023.   Reports losing her brother from an overdose in May.  Currently approved for FMLA for mental health and chronic fatigue.   Previous medication trials: Adderall, Abilify, Ambien Clonazepam, Wellbutrin, Rexulti, Buspar, Lamictal, Lithium, Adderall XR  Review of Systems:  Review of Systems  Musculoskeletal:  Negative for gait problem.  Neurological:  Negative for tremors.  Psychiatric/Behavioral:         Please refer to HPI    Medications: I have reviewed the patient's current medications.  Current Outpatient Medications  Medication Sig Dispense Refill   AIMOVIG 70 MG/ML SOAJ Inject 1 Syringe into the skin every 30 (thirty) days.     amphetamine-dextroamphetamine (ADDERALL XR) 30 MG 24 hr capsule Take 1 capsule (30 mg total) by mouth daily. 30 capsule 0   amphetamine-dextroamphetamine (ADDERALL XR) 30 MG 24 hr capsule Take 1 capsule (30 mg total) by mouth daily. 30 capsule 0   brexpiprazole (REXULTI) 2 MG TABS tablet TAKE 1 TABLET(2 MG) BY MOUTH DAILY 30 tablet 5   buPROPion (WELLBUTRIN XL) 300 MG 24 hr tablet Take 1 tablet (300 mg total) by mouth every evening. 30 tablet 5  ciprofloxacin (CIPRO) 500 MG tablet Take 1 tablet (500 mg total) by mouth every 12 (twelve) hours. 10 tablet 0   lamoTRIgine (LAMICTAL) 100 MG tablet TAKE 1 TABLET BY MOUTH DAILY 30 tablet 0   levocetirizine (XYZAL) 5 MG tablet Take 5 mg by mouth at bedtime.     levothyroxine (SYNTHROID) 100 MCG tablet levothyroxine 137  mcg tablet  TAKE 1 TABLET BY MOUTH EVERY MORNING 30 MINS BEFORE MEALS     megestrol (MEGACE) 40 MG tablet TAKE 1 TABLET(40 MG) BY MOUTH DAILY 30 tablet 6   phenazopyridine (PYRIDIUM) 100 MG tablet Take 1 tablet (100 mg total) by mouth 3 (three) times daily as needed for pain. 10 tablet 0   promethazine (PHENERGAN) 25 MG tablet Take 25 mg by mouth every 6 (six) hours as needed.      tirzepatide (MOUNJARO) 7.5 MG/0.5ML Pen Inject 7.5 mg into the skin once a week.     Ubrogepant (UBRELVY) 100 MG TABS Take by mouth.     zolpidem (AMBIEN) 10 MG tablet Take 10 mg by mouth daily.     No current facility-administered medications for this visit.    Medication Side Effects: None  Allergies:  Allergies  Allergen Reactions   Abilify [Aripiprazole] Other (See Comments)    Reaction:  Restless leg syndrome    Other     Bean Sprouts   Reglan [Metoclopramide] Other (See Comments)    Reaction:  Hallucinations    Triptans Other (See Comments)    Elevated blood pressure    Dexamethasone Rash    Past Medical History:  Diagnosis Date   ADHD    Anxiety    Carpal tunnel syndrome    Chronic fatigue    Connective tissue disorder (HCC)    Contraceptive management 08/09/2015   Decreased libido 08/09/2015   Friable cervix 08/09/2015   History of abnormal cervical Pap smear 08/09/2015   Hypothyroidism    IBS (irritable bowel syndrome)    Irregular intermenstrual bleeding 08/09/2015   Major depressive disorder    Migraine    Mixed connective tissue disease (HCC)    Pseudobulbar affect    PTSD (post-traumatic stress disorder)    Sjogren's syndrome with lung involvement (HCC)    Thyroid disease    Vaginal Pap smear, abnormal     Family History  Problem Relation Age of Onset   Diabetes Mother    Hypertension Mother    Hypothyroidism Mother    Hypertension Father    Stroke Father    Cancer Father        prostate, with metastisis   Cancer Maternal Grandmother        Gaulbladder   Kidney  disease Paternal Grandmother    Hypertension Paternal Grandmother    Hyperlipidemia Paternal Grandmother    Heart disease Paternal Grandmother    Heart attack Paternal Grandmother    Diabetes Daughter    Hypothyroidism Daughter    Raynaud syndrome Daughter    Rheumatologic disease Paternal Grandfather    Anxiety disorder Sister    Alcoholism Sister    Anxiety disorder Brother    Drug abuse Brother    Anxiety disorder Sister    Cancer Maternal Uncle        Lung   Colon cancer Neg Hx     Social History   Socioeconomic History   Marital status: Legally Separated    Spouse name: Not on file   Number of children: 3   Years of education: Not on file  Highest education level: Not on file  Occupational History   Occupation: Designer, jewellery    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    Comment: ED, 12 years  Tobacco Use   Smoking status: Never   Smokeless tobacco: Never   Tobacco comments:    Never smoker  Vaping Use   Vaping status: Never Used  Substance and Sexual Activity   Alcohol use: Yes    Comment: occ.   Drug use: No   Sexual activity: Yes    Birth control/protection: Surgical    Comment: tubal and ablation  Other Topics Concern   Not on file  Social History Narrative   Not on file   Social Determinants of Health   Financial Resource Strain: Low Risk  (10/26/2022)   Received from [facility], [facility]   Overall Financial Resource Strain (CARDIA)    Difficulty of Paying Living Expenses: Not hard at all  Food Insecurity: No Food Insecurity (10/26/2022)   Received from [facility], [facility]   Hunger Vital Sign    Worried About Running Out of Food in the Last Year: Never true    Ran Out of Food in the Last Year: Never true  Transportation Needs: No Transportation Needs (10/26/2022)   Received from [facility], [facility]   PRAPARE - Transportation    Lack of Transportation (Medical): No    Lack of Transportation (Non-Medical): No  Physical Activity:  Insufficiently Active (10/26/2022)   Received from [facility], [facility]   Exercise Vital Sign    Days of Exercise per Week: 1 day    Minutes of Exercise per Session: 30 min  Stress: Stress Concern Present (10/26/2022)   Received from [facility], [facility] - Occupational Stress Questionnaire    Feeling of Stress : To some extent  Social Connections: Socially Integrated (10/26/2022)   Received from [facility], [facility]   Social Network    How would you rate your social network (family, work, friends)?: Good participation with social networks  Intimate Partner Violence: Not At Risk (10/26/2022)   Received from [facility], [facility]   HITS    Over the last 12 months how often did your partner physically hurt you?: 1    Over the last 12 months how often did your partner insult you or talk down to you?: 1    Over the last 12 months how often did your partner threaten you with physical harm?: 1    Over the last 12 months how often did your partner scream or curse at you?: 1    Past Medical History, Surgical history, Social history, and Family history were reviewed and updated as appropriate.   Please see review of systems for further details on the patient's review from today.   Objective:   Physical Exam:  There were no vitals taken for this visit.  Physical Exam Constitutional:      General: She is not in acute distress. Musculoskeletal:        General: No deformity.  Neurological:     Mental Status: She is alert and oriented to person, place, and time.     Coordination: Coordination normal.  Psychiatric:        Attention and Perception: Attention and perception normal. She does not perceive auditory or visual hallucinations.        Mood and Affect: Mood normal. Mood is not anxious or depressed. Affect is not labile, blunt, angry or inappropriate.  Speech: Speech normal.        Behavior: Behavior normal.         Thought Content: Thought content normal. Thought content is not paranoid or delusional. Thought content does not include homicidal or suicidal ideation. Thought content does not include homicidal or suicidal plan.        Cognition and Memory: Cognition and memory normal.        Judgment: Judgment normal.     Comments: Insight intact     Lab Review:     Component Value Date/Time   NA 137 04/02/2021 1339   K 3.9 04/02/2021 1339   CL 105 04/02/2021 1339   CO2 24 04/02/2021 1339   GLUCOSE 95 04/02/2021 1339   BUN 10 04/02/2021 1339   CREATININE 1.08 (H) 04/02/2021 1339   CALCIUM 9.3 04/02/2021 1339   PROT 8.0 03/31/2020 1217   ALBUMIN 4.6 03/31/2020 1217   AST 28 03/31/2020 1217   ALT 29 03/31/2020 1217   ALKPHOS 84 03/31/2020 1217   BILITOT 0.8 03/31/2020 1217   GFRNONAA >60 04/02/2021 1339   GFRAA >60 03/31/2020 1217       Component Value Date/Time   WBC 6.3 04/02/2021 1339   RBC 4.40 04/02/2021 1339   HGB 14.0 04/02/2021 1339   HGB 13.5 07/25/2017 1238   HCT 42.9 04/02/2021 1339   HCT 42.4 07/25/2017 1238   PLT 313 04/02/2021 1339   PLT 300 07/25/2017 1238   MCV 97.5 04/02/2021 1339   MCV 91 07/25/2017 1238   MCH 31.8 04/02/2021 1339   MCHC 32.6 04/02/2021 1339   RDW 12.6 04/02/2021 1339   RDW 13.2 07/25/2017 1238   LYMPHSABS 1.9 04/02/2021 1339   LYMPHSABS 4.6 (H) 07/25/2017 1238   MONOABS 0.6 04/02/2021 1339   EOSABS 0.1 04/02/2021 1339   EOSABS 0.2 07/25/2017 1238   BASOSABS 0.0 04/02/2021 1339   BASOSABS 0.0 07/25/2017 1238    No results found for: "POCLITH", "LITHIUM"   No results found for: "PHENYTOIN", "PHENOBARB", "VALPROATE", "CBMZ"   .res Assessment: Plan:    Plan:  PDMP reviewed  Rexulti 2mg  daily Lamictal 100mg  daily Wellbutrin XL300mg  daily Adderall XR 30mg  daily  Add Adderall 10mg  in the afternoon - plans to d/c Phentermine.  PCP prescribes Ambien and Clonazepam   Monitor BP between visits while taking stimulant medication.    FMLA paperwork completed.  Time spent with patient was 25 minutes. Greater than 50% of face to face time with patient was spent on counseling and coordination of care.    RTC 3 months  Patient advised to contact office with any questions, adverse effects, or acute worsening in signs and symptoms.  Discussed potential metabolic side effects associated with atypical antipsychotics, as well as potential risk for movement side effects. Advised pt to contact office if movement side effects occur.    Discussed potential benefits, risks, and side effects of stimulants with patient to include increased heart rate, palpitations, insomnia, increased anxiety, increased irritability, or decreased appetite. Instructed patient to contact office if experiencing any significant tolerability issues.   There are no diagnoses linked to this encounter.   Please see After Visit Summary for patient specific instructions.  Future Appointments  Date Time Provider Department Center  08/06/2023 10:40 AM Izabella Marcantel, Thereasa Solo, NP [facility] None  09/17/2023  1:30 PM [name], NP [facility]    No orders of the defined types were placed in this encounter.     -------------------------------

## 2023-09-17 ENCOUNTER — Encounter: Payer: Self-pay | Admitting: Adult Health

## 2023-09-17 ENCOUNTER — Other Ambulatory Visit (HOSPITAL_COMMUNITY)
Admission: RE | Admit: 2023-09-17 | Discharge: 2023-09-17 | Disposition: A | Discharge status: Home / Self Care | Payer: 59 | Source: Ambulatory Visit | Attending: Adult Health | Admitting: Adult Health

## 2023-09-17 ENCOUNTER — Ambulatory Visit: Payer: 59 | Admitting: Adult Health

## 2023-09-17 VITALS — BP 157/107 | HR 106 | Ht 63.0 in | Wt 197.5 lb

## 2023-09-17 DIAGNOSIS — Z1331 Encounter for screening for depression: Secondary | ICD-10-CM | POA: Diagnosis not present

## 2023-09-17 DIAGNOSIS — Z1211 Encounter for screening for malignant neoplasm of colon: Secondary | ICD-10-CM | POA: Diagnosis not present

## 2023-09-17 DIAGNOSIS — Z01419 Encounter for gynecological examination (general) (routine) without abnormal findings: Secondary | ICD-10-CM | POA: Diagnosis present

## 2023-09-17 DIAGNOSIS — N921 Excessive and frequent menstruation with irregular cycle: Secondary | ICD-10-CM

## 2023-09-17 DIAGNOSIS — I10 Essential (primary) hypertension: Secondary | ICD-10-CM | POA: Diagnosis not present

## 2023-09-17 LAB — HEMOCCULT GUIAC POC 1CARD (OFFICE): Fecal Occult Blood, POC: NEGATIVE

## 2023-09-17 NOTE — Progress Notes (Signed)
Patient ID: Marisa Johnson, female   DOB: 05/10/1976, 47 y.o.   MRN: [id number] History of Present Illness: Marisa Johnson is a 47 year old white female, divorced, [id number] in for a well woman gyn exam and pap. She has not taken meds today. Has stress Gma and G pa in hospital.  PCP is Ronny Bacon NP   Current Medications, Allergies, Past Medical History, Past Surgical History, Family History and Social History were reviewed in [employer] record.     Review of Systems: Patient denies any headaches, hearing loss, fatigue, blurred vision, shortness of breath, chest pain, abdominal pain, problems with bowel movements, urination, or intercourse. No joint pain or mood swings.  Bleeding has stopped with megace   Physical Exam:BP (!) 157/107 (BP Location: Right Arm, Patient Position: Sitting, Cuff Size: Normal)   Pulse (!) 106   Ht 5\' 3"  (1.6 m)   Wt 197 lb 8 oz (89.6 kg)   BMI 34.99 kg/m   General:  Well developed, well nourished, no acute distress Skin:  Warm and dry,tan Neck:  Midline trachea, normal thyroid, good ROM, no lymphadenopathy Lungs; Clear to auscultation bilaterally Breast:  No dominant palpable mass, retraction, or nipple discharge Cardiovascular: Regular rate and rhythm Abdomen:  Soft, non tender, no hepatosplenomegaly Pelvic:  External genitalia is normal in appearance, no lesions.  The vagina is normal in appearance. Urethra has no lesions or masses. The cervix is smooth, pap with HR HPV genotyping performed, and cervix was friable with EC brush.  Uterus is felt to be normal size, shape, and contour.  No adnexal masses or tenderness noted.Bladder is non tender, no masses felt. Rectal: Good sphincter tone, no polyps, or hemorrhoids felt.  Hemoccult negative. Extremities/musculoskeletal:  No swelling or varicosities noted, no clubbing or cyanosis Psych:  No mood changes, alert and cooperative,seems happy AA is 3 Fall risk is low    09/17/2023    1:31 PM 03/13/2022     2:38 PM 06/27/2020    3:31 PM  Depression screen PHQ 2/9  Decreased Interest 1 1 0  Down, Depressed, Hopeless 0 1 0  PHQ - 2 Score 1 2 0  Altered sleeping 1 1 0  Tired, decreased energy 1 2 0  Change in appetite 0 0 0  Feeling bad or failure about yourself  0 1 0  Trouble concentrating 1 2 0  Moving slowly or fidgety/restless 0 0 0  Suicidal thoughts 0 0 0  PHQ-9 Score 4 8 0  Difficult doing work/chores   Not difficult at all       09/17/2023    1:32 PM 03/13/2022    2:38 PM 06/27/2020    3:31 PM  GAD 7 : Generalized Anxiety Score  Nervous, Anxious, on Edge 1 1 0  Control/stop worrying 0 1 0  Worry too much - different things 1 1 0  Trouble relaxing 1 1 0  Restless 0 0 0  Easily annoyed or irritable 1 1 0  Afraid - awful might happen 1 1 0  Total GAD 7 Score 5 6 0  Anxiety Difficulty   Not difficult at all    Upstream - 09/17/23 1340       Pregnancy Intention Screening   Does the patient want to become pregnant in the next year? No    Does the patient's partner want to become pregnant in the next year? No    Would the patient like to discuss contraceptive options today? No  Contraception Wrap Up   Current Method Female Sterilization    End Method Female Sterilization    Contraception Counseling Provided No            Examination chaperoned by Malachy Mood LPN     Impression and Plan: 1. Encounter for gynecological examination with Papanicolaou smear of cervix Pap was sent Pap in 3 years if negative Physical in 1 year Labs with PCP Had negative mammogram 03/19/23 Cologuard was negative last year  - Cytology - PAP( [place]) Stay active  2. Encounter for screening fecal occult blood testing Hemoccult was negative  - POCT occult blood stool  3. Hypertension, unspecified type Take BP meds  Follow up with PCP  4. Irregular intermenstrual bleeding Bleeding controlled with megace 40 mg 1 daily, has refills

## 2023-09-23 ENCOUNTER — Encounter: Payer: Self-pay | Admitting: Adult Health

## 2023-09-23 DIAGNOSIS — R87610 Atypical squamous cells of undetermined significance on cytologic smear of cervix (ASC-US): Secondary | ICD-10-CM | POA: Insufficient documentation

## 2023-09-23 LAB — CYTOLOGY - PAP
Comment: NEGATIVE
Diagnosis: UNDETERMINED — AB
High risk HPV: NEGATIVE

## 2023-10-21 ENCOUNTER — Telehealth: Payer: Self-pay | Admitting: Adult Health

## 2023-10-21 NOTE — Telephone Encounter (Signed)
 Next visit is 11/05/23. Requesting refill for D-Amphetamine salts 30 mg called to:  [id number] - [place], [place] - [address]    Phone: [phone]  Fax: [phone]

## 2023-10-21 NOTE — Telephone Encounter (Signed)
 I show patient should have a RF. I called the pharmacy and they said they had the script, but not the medication. Patient notified. She said she doesn't take the medication on days she doesn't work so isn't completely out. She will check back with the pharmacy later to see If they have been able to get some in.

## 2023-10-30 ENCOUNTER — Telehealth: Payer: Self-pay

## 2023-10-30 NOTE — Telephone Encounter (Signed)
Prior Authorization renewal submitted to [facility] for Rexulti 2 mg, pending response

## 2023-11-03 NOTE — Telephone Encounter (Signed)
[facility] faxed back that a PA is not required because Rexulti 2 mg is on her list of covered medications. PA was cancelled. Any questions the pharmacy should contact [facility] Prior Authorization dept at [phone].

## 2023-11-05 ENCOUNTER — Telehealth: Payer: 59 | Admitting: Adult Health

## 2023-11-05 ENCOUNTER — Encounter: Payer: Self-pay | Admitting: Adult Health

## 2023-11-05 DIAGNOSIS — F319 Bipolar disorder, unspecified: Secondary | ICD-10-CM | POA: Diagnosis not present

## 2023-11-05 DIAGNOSIS — F411 Generalized anxiety disorder: Secondary | ICD-10-CM

## 2023-11-05 DIAGNOSIS — F908 Attention-deficit hyperactivity disorder, other type: Secondary | ICD-10-CM

## 2023-11-05 DIAGNOSIS — F909 Attention-deficit hyperactivity disorder, unspecified type: Secondary | ICD-10-CM | POA: Diagnosis not present

## 2023-11-05 DIAGNOSIS — F428 Other obsessive-compulsive disorder: Secondary | ICD-10-CM

## 2023-11-05 MED ORDER — AMPHETAMINE-DEXTROAMPHETAMINE 10 MG PO TABS: 10.0000 mg | ORAL_TABLET | Freq: Every day | ORAL | 0 refills | Status: DC

## 2023-11-05 MED ORDER — AMPHETAMINE-DEXTROAMPHET ER 30 MG PO CP24: 30.0000 mg | ORAL_CAPSULE | Freq: Every day | ORAL | 0 refills | Status: DC

## 2023-11-05 MED ORDER — AMPHETAMINE-DEXTROAMPHETAMINE 10 MG PO TABS
10.0000 mg | ORAL_TABLET | Freq: Every day | ORAL | 0 refills | Status: DC
Start: 2023-11-05 — End: 2024-01-20

## 2023-11-05 NOTE — Progress Notes (Signed)
Marisa Johnson [id number] 1975-12-15 48 y.o.  Virtual Visit via Video Note  I connected with pt @ on 11/05/23 at 10:30 AM EST by a video enabled telemedicine application and verified that I am speaking with the correct person using two identifiers.   I discussed the limitations of evaluation and management by telemedicine and the availability of in person appointments. The patient expressed understanding and agreed to proceed.  I discussed the assessment and treatment plan with the patient. The patient was provided an opportunity to ask questions and all were answered. The patient agreed with the plan and demonstrated an understanding of the instructions.   The patient was advised to call back or seek an in-person evaluation if the symptoms worsen or if the condition fails to improve as anticipated.  I provided 25 minutes of non-face-to-face time during this encounter.  The patient was located at home.  The provider was located at [facility].   Dorothyann Gibbs, NP   Subjective:   Patient ID:  Marisa Johnson is a 48 y.o. (DOB 12/30/1975) female.  Chief Complaint: No chief complaint on file.   HPI Marisa Johnson presents for follow-up of ADHD, MDD, GAD, obsessional thoughts, and BPD.  Diagnosed with Bipolar in 2011 - mania. Working on thyroid regulation. Also has chronic fatigue related to Covid.   Patient seen today for medication management.  Describes mood today as "ok". Pleasant. Denies tearfulness. Mood symptoms - denies anxiety, depression and irritability. Denies recent panic attacks. Denies worry, rumination, and over thinking. Denies obsessive thoughts and acts. Mood is stable. Stating "I feel like I'm doing pretty good". Feels like medications are working well for her. Stable interest and motivation. Taking medications as prescribed.  Energy levels lower. Active, does not have a regular exercise routine.  Enjoys some usual interests and activities. Divorced. Dating.  Has 3 grown children. Spending time with family.  Appetite adequate. Weight stable - 195 pounds - taking Mounjaro.  Averages 8 to 10 hours most nights.  Focus and concentration improved. Completing tasks. Managing aspects of household. 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 Denies AH or VH. Consumes alcohol - has a few drinks a week. Denies THC use. History of self harm - clean since December 2023.   Previous medication trials: Adderall, Abilify, Ambien Clonazepam, Wellbutrin, Rexulti, Buspar, Lamictal, Lithium, Adderall XR  Review of Systems:  Review of Systems  Musculoskeletal:  Negative for gait problem.  Neurological:  Negative for tremors.  Psychiatric/Behavioral:         Please refer to HPI    Medications: I have reviewed the patient's current medications.  Current Outpatient Medications  Medication Sig Dispense Refill   AIMOVIG 70 MG/ML SOAJ Inject 1 Syringe into the skin every 30 (thirty) days.     amphetamine-dextroamphetamine (ADDERALL XR) 30 MG 24 hr capsule Take 1 capsule (30 mg total) by mouth daily. 30 capsule 0   amphetamine-dextroamphetamine (ADDERALL XR) 30 MG 24 hr capsule Take 1 capsule (30 mg total) by mouth daily. 30 capsule 0   amphetamine-dextroamphetamine (ADDERALL XR) 30 MG 24 hr capsule Take 1 capsule (30 mg total) by mouth daily. 30 capsule 0   amphetamine-dextroamphetamine (ADDERALL) 10 MG tablet Take 1 tablet (10 mg total) by mouth daily. 30 tablet 0   amphetamine-dextroamphetamine (ADDERALL) 10 MG tablet Take 1 tablet (10 mg total) by mouth daily. 30 tablet 0   amphetamine-dextroamphetamine (ADDERALL) 10 MG tablet Take 1 tablet (10 mg total) by mouth daily. 30 tablet 0  brexpiprazole (REXULTI) 2 MG TABS tablet TAKE 1 TABLET(2 MG) BY MOUTH DAILY 30 tablet 5   buPROPion (WELLBUTRIN XL) 300 MG 24 hr tablet Take 1 tablet (300 mg total) by mouth every evening. 30 tablet 5   clonazePAM (KLONOPIN) 0.5 MG tablet Take by mouth.     cyanocobalamin (VITAMIN B12) 1000  MCG/ML injection Inject 1,000 mcg into the skin every 30 (thirty) days.     fluconazole (DIFLUCAN) 150 MG tablet Take by mouth.     fluticasone (FLONASE) 50 MCG/ACT nasal spray Place 1 spray into both nostrils daily.     lamoTRIgine (LAMICTAL) 100 MG tablet TAKE 1 TABLET BY MOUTH DAILY 30 tablet 5   levocetirizine (XYZAL) 5 MG tablet Take 5 mg by mouth at bedtime.     levothyroxine (SYNTHROID) 112 MCG tablet Take 112 mcg by mouth daily.     megestrol (MEGACE) 40 MG tablet TAKE 1 TABLET(40 MG) BY MOUTH DAILY 30 tablet 6   metoprolol succinate (TOPROL-XL) 25 MG 24 hr tablet Take 25 mg by mouth 2 (two) times daily.     MOUNJARO 15 MG/0.5ML Pen SMARTSIG:15 Milligram(s) SUB-Q Once a Week     pantoprazole (PROTONIX) 40 MG tablet Take by mouth.     promethazine (PHENERGAN) 25 MG tablet Take 25 mg by mouth every 6 (six) hours as needed.      Ubrogepant (UBRELVY) 100 MG TABS Take by mouth.     valACYclovir (VALTREX) 1000 MG tablet TAKE 1 TABLET(1000 MG) BY MOUTH DAILY AS NEEDED     zolpidem (AMBIEN) 10 MG tablet Take 10 mg by mouth daily.     No current facility-administered medications for this visit.    Medication Side Effects: None  Allergies:  Allergies  Allergen Reactions   Abilify [Aripiprazole] Other (See Comments)    Reaction:  Restless leg syndrome    Other     Bean Sprouts   Reglan [Metoclopramide] Other (See Comments)    Reaction:  Hallucinations    Triptans Other (See Comments)    Elevated blood pressure    Dexamethasone Rash    Past Medical History:  Diagnosis Date   ADHD    Anxiety    Carpal tunnel syndrome    Chronic fatigue    Connective tissue disorder (HCC)    Contraceptive management 08/09/2015   Decreased libido 08/09/2015   Friable cervix 08/09/2015   History of abnormal cervical Pap smear 08/09/2015   Hypertension    Hypothyroidism    IBS (irritable bowel syndrome)    Irregular intermenstrual bleeding 08/09/2015   Major depressive disorder    Migraine     Mixed connective tissue disease (HCC)    Pseudobulbar affect    PTSD (post-traumatic stress disorder)    Sjogren's syndrome with lung involvement (HCC)    Thyroid disease    Vaginal Pap smear, abnormal     Family History  Problem Relation Age of Onset   Diabetes Mother    Hypertension Mother    Hypothyroidism Mother    Hypertension Father    Stroke Father    Cancer Father        prostate, with metastisis   Cancer Maternal Grandmother        Gaulbladder   Kidney disease Paternal Grandmother    Hypertension Paternal Grandmother    Hyperlipidemia Paternal Grandmother    Heart disease Paternal Grandmother    Heart attack Paternal Grandmother    Diabetes Daughter    Hypothyroidism Daughter    Raynaud syndrome Daughter  Rheumatologic disease Paternal Grandfather    Anxiety disorder Sister    Alcoholism Sister    Anxiety disorder Brother    Drug abuse Brother    Anxiety disorder Sister    Cancer Maternal Uncle        Lung   Colon cancer Neg Hx     Social History   Socioeconomic History   Marital status: Divorced    Spouse name: Not on file   Number of children: 3   Years of education: Not on file   Highest education level: Not on file  Occupational History   Occupation: Designer, jewellery    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    Comment: ED, 12 years  Tobacco Use   Smoking status: Never   Smokeless tobacco: Never   Tobacco comments:    Never smoker  Vaping Use   Vaping status: Never Used  Substance and Sexual Activity   Alcohol use: Yes   Drug use: No   Sexual activity: Yes    Birth control/protection: Surgical    Comment: tubal and ablation  Other Topics Concern   Not on file  Social History Narrative   Not on file   Social Drivers of Health   Financial Resource Strain: Low Risk  (09/17/2023)   Overall Financial Resource Strain (CARDIA)    Difficulty of Paying Living Expenses: Not very hard  Food Insecurity: No Food Insecurity (09/17/2023)   Hunger Vital  Sign    Worried About Running Out of Food in the Last Year: Never true    Ran Out of Food in the Last Year: Never true  Transportation Needs: No Transportation Needs (09/17/2023)   PRAPARE - Administrator, Civil Service (Medical): No    Lack of Transportation (Non-Medical): No  Physical Activity: Insufficiently Active (09/17/2023)   Exercise Vital Sign    Days of Exercise per Week: 2 days    Minutes of Exercise per Session: 30 min  Stress: No Stress Concern Present (09/17/2023)   [employer] of Occupational Health - Occupational Stress Questionnaire    Feeling of Stress : Only a little  Social Connections: Moderately Isolated (09/17/2023)   Social Connection and Isolation Panel [NHANES]    Frequency of Communication with Friends and Family: More than three times a week    Frequency of Social Gatherings with Friends and Family: Twice a week    Attends Religious Services: Never    Database administrator or Organizations: Yes    Attends Banker Meetings: 1 to 4 times per year    Marital Status: Divorced  Intimate Partner Violence: Not At Risk (09/17/2023)   Humiliation, Afraid, Rape, and Kick questionnaire    Fear of Current or Ex-Partner: No    Emotionally Abused: No    Physically Abused: No    Sexually Abused: No    Past Medical History, Surgical history, Social history, and Family history were reviewed and updated as appropriate.   Please see review of systems for further details on the patient's review from today.   Objective:   Physical Exam:  There were no vitals taken for this visit.  Physical Exam Constitutional:      General: She is not in acute distress. Musculoskeletal:        General: No deformity.  Neurological:     Mental Status: She is alert and oriented to person, place, and time.     Coordination: Coordination normal.  Psychiatric:        Attention and  Perception: Attention and perception normal. She does not perceive auditory or  visual hallucinations.        Mood and Affect: Affect is not labile, blunt, angry or inappropriate.        Speech: Speech normal.        Behavior: Behavior normal.        Thought Content: Thought content normal. Thought content is not paranoid or delusional. Thought content does not include homicidal or suicidal ideation. Thought content does not include homicidal or suicidal plan.        Cognition and Memory: Cognition and memory normal.        Judgment: Judgment normal.     Comments: Insight intact     Lab Review:     Component Value Date/Time   NA 137 04/02/2021 1339   K 3.9 04/02/2021 1339   CL 105 04/02/2021 1339   CO2 24 04/02/2021 1339   GLUCOSE 95 04/02/2021 1339   BUN 10 04/02/2021 1339   CREATININE 1.08 (H) 04/02/2021 1339   CALCIUM 9.3 04/02/2021 1339   PROT 8.0 03/31/2020 1217   ALBUMIN 4.6 03/31/2020 1217   AST 28 03/31/2020 1217   ALT 29 03/31/2020 1217   ALKPHOS 84 03/31/2020 1217   BILITOT 0.8 03/31/2020 1217   GFRNONAA >60 04/02/2021 1339   GFRAA >60 03/31/2020 1217       Component Value Date/Time   WBC 6.3 04/02/2021 1339   RBC 4.40 04/02/2021 1339   HGB 14.0 04/02/2021 1339   HGB 13.5 07/25/2017 1238   HCT 42.9 04/02/2021 1339   HCT 42.4 07/25/2017 1238   PLT 313 04/02/2021 1339   PLT 300 07/25/2017 1238   MCV 97.5 04/02/2021 1339   MCV 91 07/25/2017 1238   MCH 31.8 04/02/2021 1339   MCHC 32.6 04/02/2021 1339   RDW 12.6 04/02/2021 1339   RDW 13.2 07/25/2017 1238   LYMPHSABS 1.9 04/02/2021 1339   LYMPHSABS 4.6 (H) 07/25/2017 1238   MONOABS 0.6 04/02/2021 1339   EOSABS 0.1 04/02/2021 1339   EOSABS 0.2 07/25/2017 1238   BASOSABS 0.0 04/02/2021 1339   BASOSABS 0.0 07/25/2017 1238    No results found for: "POCLITH", "LITHIUM"   No results found for: "PHENYTOIN", "PHENOBARB", "VALPROATE", "CBMZ"   .res Assessment: Plan:   Plan:  PDMP reviewed  Rexulti 2mg  daily Lamictal 100mg  daily Wellbutrin XL300mg  daily Adderall XR 30mg   daily Adderall 10mg  in the afternoon  PCP prescribes Ambien and Clonazepam   Monitor BP between visits while taking stimulant medication.   FMLA paperwork completed.  25 minutes spent dedicated to the care of this patient on the date of this encounter to include pre-visit review of records, ordering of medication, post visit documentation, and face-to-face time with the patient discussing ADHD, GAD, obsessional thoughts, and BPD.  RTC 3 months  Patient advised to contact office with any questions, adverse effects, or acute worsening in signs and symptoms.  Discussed potential metabolic side effects associated with atypical antipsychotics, as well as potential risk for movement side effects. Advised pt to contact office if movement side effects occur.    Discussed potential benefits, risks, and side effects of stimulants with patient to include increased heart rate, palpitations, insomnia, increased anxiety, increased irritability, or decreased appetite. Instructed patient to contact office if experiencing any significant tolerability issues.  There are no diagnoses linked to this encounter.   Please see After Visit Summary for patient specific instructions.  Future Appointments  Date Time Provider Department Center  11/05/2023  10:30 AM Tangy Drozdowski, Thereasa Solo, NP [facility] None    No orders of the defined types were placed in this encounter.     -------------------------------

## 2024-01-19 ENCOUNTER — Ambulatory Visit: Payer: Self-pay

## 2024-01-20 ENCOUNTER — Telehealth: Payer: 59 | Admitting: Adult Health

## 2024-01-20 ENCOUNTER — Encounter: Payer: Self-pay | Admitting: Adult Health

## 2024-01-20 DIAGNOSIS — F428 Other obsessive-compulsive disorder: Secondary | ICD-10-CM | POA: Diagnosis not present

## 2024-01-20 DIAGNOSIS — F411 Generalized anxiety disorder: Secondary | ICD-10-CM | POA: Diagnosis not present

## 2024-01-20 DIAGNOSIS — F319 Bipolar disorder, unspecified: Secondary | ICD-10-CM | POA: Diagnosis not present

## 2024-01-20 DIAGNOSIS — F902 Attention-deficit hyperactivity disorder, combined type: Secondary | ICD-10-CM

## 2024-01-20 DIAGNOSIS — F909 Attention-deficit hyperactivity disorder, unspecified type: Secondary | ICD-10-CM

## 2024-01-20 MED ORDER — AMPHETAMINE-DEXTROAMPHETAMINE 10 MG PO TABS
10.0000 mg | ORAL_TABLET | Freq: Every day | ORAL | 0 refills | Status: DC
Start: 2024-02-17 — End: 2024-04-20

## 2024-01-20 MED ORDER — AMPHETAMINE-DEXTROAMPHETAMINE 10 MG PO TABS
10.0000 mg | ORAL_TABLET | Freq: Every day | ORAL | 0 refills | Status: DC
Start: 2024-01-20 — End: 2024-04-20

## 2024-01-20 MED ORDER — AMPHETAMINE-DEXTROAMPHET ER 30 MG PO CP24: 30.0000 mg | ORAL_CAPSULE | Freq: Every day | ORAL | 0 refills | Status: DC

## 2024-01-20 MED ORDER — LAMOTRIGINE 100 MG PO TABS: ORAL_TABLET | ORAL | 5 refills | Status: DC

## 2024-01-20 MED ORDER — AMPHETAMINE-DEXTROAMPHETAMINE 10 MG PO TABS: 10.0000 mg | ORAL_TABLET | Freq: Every day | ORAL | 0 refills | Status: DC

## 2024-01-20 MED ORDER — BREXPIPRAZOLE 2 MG PO TABS: ORAL_TABLET | ORAL | 5 refills | Status: DC

## 2024-01-20 MED ORDER — BUPROPION HCL ER (XL) 300 MG PO TB24: 300.0000 mg | ORAL_TABLET | Freq: Every evening | ORAL | 5 refills | Status: DC

## 2024-01-20 NOTE — Progress Notes (Signed)
 Marisa Johnson [id number] 1976/09/25 48 y.o.  Virtual Visit via Video Note  I connected with pt @ on 01/20/24 at  5:30 PM EDT by a video enabled telemedicine application and verified that I am speaking with the correct person using two identifiers.   I discussed the limitations of evaluation and management by telemedicine and the availability of in person appointments. The patient expressed understanding and agreed to proceed.  I discussed the assessment and treatment plan with the patient. The patient was provided an opportunity to ask questions and all were answered. The patient agreed with the plan and demonstrated an understanding of the instructions.   The patient was advised to call back or seek an in-person evaluation if the symptoms worsen or if the condition fails to improve as anticipated.  I provided 30 minutes of non-face-to-face time during this encounter.  The patient was located at home.  The provider was located at [facility].   Dorothyann Gibbs, NP   Subjective:   Patient ID:  Marisa Johnson is a 48 y.o. (DOB 04/21/1976) female.  Chief Complaint: No chief complaint on file.   HPI Marisa Johnson presents for follow-up of ADHD, MDD, GAD, obsessional thoughts, and BPD.  Diagnosed with Bipolar in 2011 - mania. Reports thyroid dysregulation. Also has chronic fatigue related to Covid.   Patient seen today for medication management.  Describes mood today as "ok". Pleasant. Denies tearfulness. Mood symptoms - denies anxiety, depression and irritability. Reports stable interest and motivation. Denies recent panic attacks. Denies worry, rumination, and over thinking. Denies obsessive thoughts and acts. Mood is stable. Stating "I feel like I'm doing good". Feels like medications are working well for her. Taking medications as prescribed.  Energy levels lower. Active, does not have a regular exercise routine.  Enjoys some usual interests and activities. Divorced.  Dating. Has 3 grown children. Spending time with family.  Appetite adequate. Weight stable - 195 pounds - taking Mounjaro.  Averages 8 to 10 hours most nights.  Focus and concentration improved. Completing tasks. Managing aspects of household. 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 Denies SI or HI. Denies AH or VH. Consumes alcohol - has a few drinks a week. Denies THC use. History of self harm - clean since December 2023.   Previous medication trials: Adderall, Abilify, Ambien Clonazepam, Wellbutrin, Rexulti, Buspar, Lamictal, Lithium, Adderall XR  Review of Systems:  Review of Systems  Musculoskeletal:  Negative for gait problem.  Neurological:  Negative for tremors.  Psychiatric/Behavioral:         Please refer to HPI    Medications: I have reviewed the patient's current medications.  Current Outpatient Medications  Medication Sig Dispense Refill   AIMOVIG 70 MG/ML SOAJ Inject 1 Syringe into the skin every 30 (thirty) days.     amphetamine-dextroamphetamine (ADDERALL XR) 30 MG 24 hr capsule Take 1 capsule (30 mg total) by mouth daily. 30 capsule 0   amphetamine-dextroamphetamine (ADDERALL XR) 30 MG 24 hr capsule Take 1 capsule (30 mg total) by mouth daily. 30 capsule 0   amphetamine-dextroamphetamine (ADDERALL XR) 30 MG 24 hr capsule Take 1 capsule (30 mg total) by mouth daily. 30 capsule 0   amphetamine-dextroamphetamine (ADDERALL) 10 MG tablet Take 1 tablet (10 mg total) by mouth daily. 30 tablet 0   amphetamine-dextroamphetamine (ADDERALL) 10 MG tablet Take 1 tablet (10 mg total) by mouth daily. 30 tablet 0   amphetamine-dextroamphetamine (ADDERALL) 10 MG tablet Take 1 tablet (10 mg total) by mouth daily.  30 tablet 0   brexpiprazole (REXULTI) 2 MG TABS tablet TAKE 1 TABLET(2 MG) BY MOUTH DAILY 30 tablet 5   buPROPion (WELLBUTRIN XL) 300 MG 24 hr tablet Take 1 tablet (300 mg total) by mouth every evening. 30 tablet 5   clonazePAM (KLONOPIN) 0.5 MG tablet Take by mouth.      cyanocobalamin (VITAMIN B12) 1000 MCG/ML injection Inject 1,000 mcg into the skin every 30 (thirty) days.     fluconazole (DIFLUCAN) 150 MG tablet Take by mouth.     fluticasone (FLONASE) 50 MCG/ACT nasal spray Place 1 spray into both nostrils daily.     lamoTRIgine (LAMICTAL) 100 MG tablet TAKE 1 TABLET BY MOUTH DAILY 30 tablet 5   levocetirizine (XYZAL) 5 MG tablet Take 5 mg by mouth at bedtime.     levothyroxine (SYNTHROID) 112 MCG tablet Take 112 mcg by mouth daily.     megestrol (MEGACE) 40 MG tablet TAKE 1 TABLET(40 MG) BY MOUTH DAILY 30 tablet 6   metoprolol succinate (TOPROL-XL) 25 MG 24 hr tablet Take 25 mg by mouth 2 (two) times daily.     MOUNJARO 15 MG/0.5ML Pen SMARTSIG:15 Milligram(s) SUB-Q Once a Week     pantoprazole (PROTONIX) 40 MG tablet Take by mouth.     promethazine (PHENERGAN) 25 MG tablet Take 25 mg by mouth every 6 (six) hours as needed.      Ubrogepant (UBRELVY) 100 MG TABS Take by mouth.     valACYclovir (VALTREX) 1000 MG tablet TAKE 1 TABLET(1000 MG) BY MOUTH DAILY AS NEEDED     zolpidem (AMBIEN) 10 MG tablet Take 10 mg by mouth daily.     No current facility-administered medications for this visit.    Medication Side Effects: None  Allergies:  Allergies  Allergen Reactions   Abilify [Aripiprazole] Other (See Comments)    Reaction:  Restless leg syndrome    Other     Bean Sprouts   Reglan [Metoclopramide] Other (See Comments)    Reaction:  Hallucinations    Triptans Other (See Comments)    Elevated blood pressure    Dexamethasone Rash    Past Medical History:  Diagnosis Date   ADHD    Anxiety    Carpal tunnel syndrome    Chronic fatigue    Connective tissue disorder (HCC)    Contraceptive management 08/09/2015   Decreased libido 08/09/2015   Friable cervix 08/09/2015   History of abnormal cervical Pap smear 08/09/2015   Hypertension    Hypothyroidism    IBS (irritable bowel syndrome)    Irregular intermenstrual bleeding 08/09/2015   Major  depressive disorder    Migraine    Mixed connective tissue disease (HCC)    Pseudobulbar affect    PTSD (post-traumatic stress disorder)    Sjogren's syndrome with lung involvement (HCC)    Thyroid disease    Vaginal Pap smear, abnormal     Family History  Problem Relation Age of Onset   Diabetes Mother    Hypertension Mother    Hypothyroidism Mother    Hypertension Father    Stroke Father    Cancer Father        prostate, with metastisis   Cancer Maternal Grandmother        Gaulbladder   Kidney disease Paternal Grandmother    Hypertension Paternal Grandmother    Hyperlipidemia Paternal Grandmother    Heart disease Paternal Grandmother    Heart attack Paternal Grandmother    Diabetes Daughter    Hypothyroidism Daughter  Raynaud syndrome Daughter    Rheumatologic disease Paternal Grandfather    Anxiety disorder Sister    Alcoholism Sister    Anxiety disorder Brother    Drug abuse Brother    Anxiety disorder Sister    Cancer Maternal Uncle        Lung   Colon cancer Neg Hx     Social History   Socioeconomic History   Marital status: Divorced    Spouse name: Not on file   Number of children: 3   Years of education: Not on file   Highest education level: Not on file  Occupational History   Occupation: Designer, jewellery    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    Comment: ED, 12 years  Tobacco Use   Smoking status: Never   Smokeless tobacco: Never   Tobacco comments:    Never smoker  Vaping Use   Vaping status: Never Used  Substance and Sexual Activity   Alcohol use: Yes   Drug use: No   Sexual activity: Yes    Birth control/protection: Surgical    Comment: tubal and ablation  Other Topics Concern   Not on file  Social History Narrative   Not on file   Social Drivers of Health   Financial Resource Strain: Low Risk  (12/19/2023)   Received from [facility]   Overall Financial Resource Strain (CARDIA)    Difficulty of Paying Living Expenses: Not hard at all   Food Insecurity: No Food Insecurity (12/19/2023)   Received from [facility]   Hunger Vital Sign    Worried About Running Out of Food in the Last Year: Never true    Ran Out of Food in the Last Year: Never true  Transportation Needs: No Transportation Needs (12/19/2023)   Received from [facility] - [facility]    Lack of Transportation (Medical): No    Lack of Transportation (Non-Medical): No  Physical Activity: Insufficiently Active (12/19/2023)   Received from [facility]   Exercise Vital Sign    Days of Exercise per Week: 1 day    Minutes of Exercise per Session: 20 min  Stress: No Stress Concern Present (12/19/2023)   Received from [facility] - Occupational Stress Questionnaire    Feeling of Stress : Not at all  Social Connections: Socially Integrated (12/19/2023)   Received from [facility]   Social Network    How would you rate your social network (family, work, friends)?: Good participation with social networks  Intimate Partner Violence: Not At Risk (09/17/2023)   Humiliation, Afraid, Rape, and Kick questionnaire    Fear of Current or Ex-Partner: No    Emotionally Abused: No    Physically Abused: No    Sexually Abused: No    Past Medical History, Surgical history, Social history, and Family history were reviewed and updated as appropriate.   Please see review of systems for further details on the patient's review from today.   Objective:   Physical Exam:  There were no vitals taken for this visit.  Physical Exam Constitutional:      General: She is not in acute distress. Musculoskeletal:        General: No deformity.  Neurological:     Mental Status: She is alert and oriented to person, place, and time.     Coordination: Coordination normal.  Psychiatric:        Attention and Perception: Attention and perception normal. She does not perceive auditory  or visual hallucinations.        Mood and Affect:  Mood normal. Mood is not anxious or depressed. Affect is not labile, blunt, angry or inappropriate.        Speech: Speech normal.        Behavior: Behavior normal.        Thought Content: Thought content normal. Thought content is not paranoid or delusional. Thought content does not include homicidal or suicidal ideation. Thought content does not include homicidal or suicidal plan.        Cognition and Memory: Cognition and memory normal.        Judgment: Judgment normal.     Comments: Insight intact     Lab Review:     Component Value Date/Time   NA 137 04/02/2021 1339   K 3.9 04/02/2021 1339   CL 105 04/02/2021 1339   CO2 24 04/02/2021 1339   GLUCOSE 95 04/02/2021 1339   BUN 10 04/02/2021 1339   CREATININE 1.08 (H) 04/02/2021 1339   CALCIUM 9.3 04/02/2021 1339   PROT 8.0 03/31/2020 1217   ALBUMIN 4.6 03/31/2020 1217   AST 28 03/31/2020 1217   ALT 29 03/31/2020 1217   ALKPHOS 84 03/31/2020 1217   BILITOT 0.8 03/31/2020 1217   GFRNONAA >60 04/02/2021 1339   GFRAA >60 03/31/2020 1217       Component Value Date/Time   WBC 6.3 04/02/2021 1339   RBC 4.40 04/02/2021 1339   HGB 14.0 04/02/2021 1339   HGB 13.5 07/25/2017 1238   HCT 42.9 04/02/2021 1339   HCT 42.4 07/25/2017 1238   PLT 313 04/02/2021 1339   PLT 300 07/25/2017 1238   MCV 97.5 04/02/2021 1339   MCV 91 07/25/2017 1238   MCH 31.8 04/02/2021 1339   MCHC 32.6 04/02/2021 1339   RDW 12.6 04/02/2021 1339   RDW 13.2 07/25/2017 1238   LYMPHSABS 1.9 04/02/2021 1339   LYMPHSABS 4.6 (H) 07/25/2017 1238   MONOABS 0.6 04/02/2021 1339   EOSABS 0.1 04/02/2021 1339   EOSABS 0.2 07/25/2017 1238   BASOSABS 0.0 04/02/2021 1339   BASOSABS 0.0 07/25/2017 1238    No results found for: "POCLITH", "LITHIUM"   No results found for: "PHENYTOIN", "PHENOBARB", "VALPROATE", "CBMZ"   .res Assessment: Plan:    Plan:  PDMP reviewed  Rexulti 2mg  daily Lamictal 100mg  daily Wellbutrin XL300mg  daily Adderall XR 30mg   daily Adderall 10mg  in the afternoon  PCP prescribes Ambien   Monitor BP between visits while taking stimulant medication.   FMLA paperwork completed.  30 minutes spent dedicated to the care of this patient on the date of this encounter to include pre-visit review of records, ordering of medication, post visit documentation, and face-to-face time with the patient discussing ADHD, GAD, obsessional thoughts and BPD.  RTC 3 months  Patient advised to contact office with any questions, adverse effects, or acute worsening in signs and symptoms.  Discussed potential metabolic side effects associated with atypical antipsychotics, as well as potential risk for movement side effects. Advised pt to contact office if movement side effects occur.    Discussed potential benefits, risks, and side effects of stimulants with patient to include increased heart rate, palpitations, insomnia, increased anxiety, increased irritability, or decreased appetite. Instructed patient to contact office if experiencing any significant tolerability issues.   There are no diagnoses linked to this encounter.   Please see After Visit Summary for patient specific instructions.  Future Appointments  Date Time Provider Department Center  01/20/2024  5:30  PM Zanden Colver, Thereasa Solo, NP [facility] None    No orders of the defined types were placed in this encounter.     -------------------------------

## 2024-02-03 ENCOUNTER — Other Ambulatory Visit (HOSPITAL_COMMUNITY): Payer: Self-pay | Admitting: Adult Health Nurse Practitioner

## 2024-02-03 DIAGNOSIS — Z1231 Encounter for screening mammogram for malignant neoplasm of breast: Secondary | ICD-10-CM

## 2024-02-16 ENCOUNTER — Other Ambulatory Visit: Payer: Self-pay | Admitting: Adult Health

## 2024-03-22 ENCOUNTER — Ambulatory Visit (HOSPITAL_COMMUNITY)

## 2024-03-22 ENCOUNTER — Encounter (HOSPITAL_COMMUNITY): Payer: Self-pay

## 2024-03-22 ENCOUNTER — Ambulatory Visit (HOSPITAL_COMMUNITY)
Admission: RE | Admit: 2024-03-22 | Discharge: 2024-03-22 | Disposition: A | Discharge status: Home / Self Care | Source: Ambulatory Visit | Attending: Adult Health Nurse Practitioner | Admitting: Adult Health Nurse Practitioner

## 2024-03-22 DIAGNOSIS — Z1231 Encounter for screening mammogram for malignant neoplasm of breast: Secondary | ICD-10-CM | POA: Diagnosis not present

## 2024-03-29 ENCOUNTER — Other Ambulatory Visit (HOSPITAL_COMMUNITY): Payer: Self-pay | Admitting: Adult Health Nurse Practitioner

## 2024-03-29 DIAGNOSIS — R928 Other abnormal and inconclusive findings on diagnostic imaging of breast: Secondary | ICD-10-CM

## 2024-03-30 ENCOUNTER — Ambulatory Visit (HOSPITAL_COMMUNITY)
Admission: RE | Admit: 2024-03-30 | Discharge: 2024-03-30 | Disposition: A | Discharge status: Home / Self Care | Source: Ambulatory Visit | Attending: Adult Health Nurse Practitioner | Admitting: Adult Health Nurse Practitioner

## 2024-03-30 ENCOUNTER — Ambulatory Visit (HOSPITAL_COMMUNITY)
Admission: RE | Admit: 2024-03-30 | Discharge: 2024-03-30 | Discharge status: Home / Self Care | Source: Ambulatory Visit | Attending: Adult Health Nurse Practitioner

## 2024-03-30 ENCOUNTER — Encounter (HOSPITAL_COMMUNITY): Payer: Self-pay

## 2024-03-30 DIAGNOSIS — R928 Other abnormal and inconclusive findings on diagnostic imaging of breast: Secondary | ICD-10-CM | POA: Diagnosis present

## 2024-04-20 ENCOUNTER — Encounter: Payer: Self-pay | Admitting: Adult Health

## 2024-04-20 ENCOUNTER — Telehealth: Admitting: Adult Health

## 2024-04-20 DIAGNOSIS — F428 Other obsessive-compulsive disorder: Secondary | ICD-10-CM | POA: Diagnosis not present

## 2024-04-20 DIAGNOSIS — F411 Generalized anxiety disorder: Secondary | ICD-10-CM | POA: Diagnosis not present

## 2024-04-20 DIAGNOSIS — F319 Bipolar disorder, unspecified: Secondary | ICD-10-CM | POA: Diagnosis not present

## 2024-04-20 DIAGNOSIS — F902 Attention-deficit hyperactivity disorder, combined type: Secondary | ICD-10-CM | POA: Diagnosis not present

## 2024-04-20 MED ORDER — AMPHETAMINE-DEXTROAMPHET ER 30 MG PO CP24
30.0000 mg | ORAL_CAPSULE | Freq: Every day | ORAL | 0 refills | Status: DC
Start: 2024-06-15 — End: 2024-08-02

## 2024-04-20 MED ORDER — AMPHETAMINE-DEXTROAMPHETAMINE 10 MG PO TABS: 10.0000 mg | ORAL_TABLET | Freq: Every day | ORAL | 0 refills | Status: DC

## 2024-04-20 MED ORDER — AMPHETAMINE-DEXTROAMPHET ER 30 MG PO CP24
30.0000 mg | ORAL_CAPSULE | Freq: Every day | ORAL | 0 refills | Status: DC
Start: 2024-04-20 — End: 2024-08-02

## 2024-04-20 MED ORDER — AMPHETAMINE-DEXTROAMPHET ER 30 MG PO CP24: 30.0000 mg | ORAL_CAPSULE | Freq: Every day | ORAL | 0 refills | Status: DC

## 2024-04-20 NOTE — Progress Notes (Signed)
 Marisa Johnson [id number] March 27, 1976 48 y.o.  Virtual Visit via Video Note  I connected with pt @ on 04/20/24 at  5:00 PM EDT by a video enabled telemedicine application and verified that I am speaking with the correct person using two identifiers.   I discussed the limitations of evaluation and management by telemedicine and the availability of in person appointments. The patient expressed understanding and agreed to proceed.  I discussed the assessment and treatment plan with the patient. The patient was provided an opportunity to ask questions and all were answered. The patient agreed with the plan and demonstrated an understanding of the instructions.   The patient was advised to call back or seek an in-person evaluation if the symptoms worsen or if the condition fails to improve as anticipated.  I provided 25 minutes of non-face-to-face time during this encounter.  The patient was located at home.  The provider was located at [facility].   Angeline LOISE Sayers, NP   Subjective:   Patient ID:  Marisa Johnson is a 48 y.o. (DOB [birth year]) female.  Chief Complaint: No chief complaint on file.   HPI Marisa Johnson presents for follow-up of ADHD, MDD, GAD, obsessional thoughts, and BPD.  Diagnosed with Bipolar in 2011 - mania. Reports thyroid  dysregulation. Also has chronic fatigue related to Covid.   Patient seen today for medication management.  Describes mood today as ok. Pleasant. Denies tearfulness. Mood symptoms - denies anxiety, depression and irritability. Reports stable interest and motivation. Reports panic attacks after stopping clonazepam  - restarted medication. Denies worry, rumination and over thinking. Denies obsessive thoughts and acts. Reports mood is stable. Stating I feel like I'm doing ok. Feels like medications are working well for her. Taking medications as prescribed.  Energy levels lower. Active, does not have a regular exercise routine.  Enjoys some  usual interests and activities. Divorced. Dating. Has 3 grown children. Spending time with family.  Appetite adequate. Weight gain - 195 to 216 pounds - quit taking Mounjaro and restarted it.  Averages 8 to 10 hours most nights.  Focus and concentration improved. Completing tasks. Managing aspects of household. 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 Denies SI or HI. Denies AH or VH. Consumes alcohol - has a few drinks a week. Denies THC use. History of self harm - clean since December 2023.   Previous medication trials: Adderall, Abilify, Ambien  Clonazepam , Wellbutrin , Rexulti , Buspar, Lamictal , Lithium, Adderall XR   Review of Systems:  Review of Systems  Musculoskeletal:  Negative for gait problem.  Neurological:  Negative for tremors.  Psychiatric/Behavioral:         Please refer to HPI    Medications: I have reviewed the patient's current medications.  Current Outpatient Medications  Medication Sig Dispense Refill   AIMOVIG 70 MG/ML SOAJ Inject 1 Syringe into the skin every 30 (thirty) days.     amphetamine -dextroamphetamine  (ADDERALL XR) 30 MG 24 hr capsule Take 1 capsule (30 mg total) by mouth daily. 30 capsule 0   amphetamine -dextroamphetamine  (ADDERALL XR) 30 MG 24 hr capsule Take 1 capsule (30 mg total) by mouth daily. 30 capsule 0   amphetamine -dextroamphetamine  (ADDERALL XR) 30 MG 24 hr capsule Take 1 capsule (30 mg total) by mouth daily. 30 capsule 0   amphetamine -dextroamphetamine  (ADDERALL) 10 MG tablet Take 1 tablet (10 mg total) by mouth daily. 30 tablet 0   amphetamine -dextroamphetamine  (ADDERALL) 10 MG tablet Take 1 tablet (10 mg total) by mouth daily. 30 tablet 0   amphetamine -dextroamphetamine  (  ADDERALL) 10 MG tablet Take 1 tablet (10 mg total) by mouth daily. 30 tablet 0   brexpiprazole  (REXULTI ) 2 MG TABS tablet TAKE 1 TABLET(2 MG) BY MOUTH DAILY 30 tablet 5   buPROPion  (WELLBUTRIN  XL) 300 MG 24 hr tablet Take 1 tablet (300 mg total) by mouth every evening. 30 tablet 5    clonazePAM  (KLONOPIN ) 0.5 MG tablet Take by mouth.     cyanocobalamin (VITAMIN B12) 1000 MCG/ML injection Inject 1,000 mcg into the skin every 30 (thirty) days.     fluconazole  (DIFLUCAN ) 150 MG tablet Take by mouth.     fluticasone  (FLONASE) 50 MCG/ACT nasal spray Place 1 spray into both nostrils daily.     lamoTRIgine  (LAMICTAL ) 100 MG tablet TAKE 1 TABLET BY MOUTH DAILY 30 tablet 5   levocetirizine (XYZAL) 5 MG tablet Take 5 mg by mouth at bedtime.     levothyroxine  (SYNTHROID ) 112 MCG tablet Take 112 mcg by mouth daily.     megestrol  (MEGACE ) 40 MG tablet TAKE 1 TABLET(40 MG) BY MOUTH DAILY 30 tablet 6   metoprolol succinate (TOPROL-XL) 25 MG 24 hr tablet Take 25 mg by mouth 2 (two) times daily.     MOUNJARO 15 MG/0.5ML Pen SMARTSIG:15 Milligram(s) SUB-Q Once a Week     pantoprazole (PROTONIX) 40 MG tablet Take by mouth.     promethazine  (PHENERGAN ) 25 MG tablet Take 25 mg by mouth every 6 (six) hours as needed.      Ubrogepant (UBRELVY) 100 MG TABS Take by mouth.     valACYclovir (VALTREX) 1000 MG tablet TAKE 1 TABLET(1000 MG) BY MOUTH DAILY AS NEEDED     zolpidem  (AMBIEN ) 10 MG tablet Take 10 mg by mouth daily.     No current facility-administered medications for this visit.    Medication Side Effects: None  Allergies:  Allergies  Allergen Reactions   Abilify [Aripiprazole] Other (See Comments)    Reaction:  Restless leg syndrome    Other     Bean Sprouts   Reglan [Metoclopramide] Other (See Comments)    Reaction:  Hallucinations    Triptans Other (See Comments)    Elevated blood pressure    Dexamethasone  Rash    Past Medical History:  Diagnosis Date   ADHD    Anxiety    Carpal tunnel syndrome    Chronic fatigue    Connective tissue disorder (HCC)    Contraceptive management 08/09/2015   Decreased libido 08/09/2015   Friable cervix 08/09/2015   History of abnormal cervical Pap smear 08/09/2015   Hypertension    Hypothyroidism    IBS (irritable bowel syndrome)     Irregular intermenstrual bleeding 08/09/2015   Major depressive disorder    Migraine    Mixed connective tissue disease (HCC)    Pseudobulbar affect    PTSD (post-traumatic stress disorder)    Sjogren's syndrome with lung involvement (HCC)    Thyroid  disease    Vaginal Pap smear, abnormal     Family History  Problem Relation Age of Onset   Diabetes Mother    Hypertension Mother    Hypothyroidism Mother    Hypertension Father    Stroke Father    Cancer Father        prostate, with metastisis   Cancer Maternal Grandmother        Gaulbladder   Kidney disease Paternal Grandmother    Hypertension Paternal Grandmother    Hyperlipidemia Paternal Grandmother    Heart disease Paternal Grandmother    Heart attack  Paternal Grandmother    Diabetes Daughter    Hypothyroidism Daughter    Raynaud syndrome Daughter    Rheumatologic disease Paternal Grandfather    Anxiety disorder Sister    Alcoholism Sister    Anxiety disorder Brother    Drug abuse Brother    Anxiety disorder Sister    Cancer Maternal Uncle        Lung   Colon cancer Neg Hx     Social History   Socioeconomic History   Marital status: Divorced    Spouse name: Not on file   Number of children: 3   Years of education: Not on file   Highest education level: Not on file  Occupational History   Occupation: Designer, jewellery    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    Comment: ED, 12 years  Tobacco Use   Smoking status: Never   Smokeless tobacco: Never   Tobacco comments:    Never smoker  Vaping Use   Vaping status: Never Used  Substance and Sexual Activity   Alcohol use: Yes   Drug use: No   Sexual activity: Yes    Birth control/protection: Surgical    Comment: tubal and ablation  Other Topics Concern   Not on file  Social History Narrative   Not on file   Social Drivers of Health   Financial Resource Strain: Low Risk  (12/19/2023)   Received from [facility]   Overall Financial Resource Strain (CARDIA)     Difficulty of Paying Living Expenses: Not hard at all  Food Insecurity: No Food Insecurity (12/19/2023)   Received from [facility]   Hunger Vital Sign    Within the past 12 months, you worried that your food would run out before you got the money to buy more.: Never true    Within the past 12 months, the food you bought just didn't last and you didn't have money to get more.: Never true  Transportation Needs: No Transportation Needs (12/19/2023)   Received from [facility] - Transportation    Lack of Transportation (Medical): No    Lack of Transportation (Non-Medical): No  Physical Activity: Insufficiently Active (12/19/2023)   Received from [facility]   Exercise Vital Sign    On average, how many days per week do you engage in moderate to strenuous exercise (like a brisk walk)?: 1 day    On average, how many minutes do you engage in exercise at this level?: 20 min  Stress: No Stress Concern Present (12/19/2023)   Received from [facility] - Occupational Stress Questionnaire    Feeling of Stress : Not at all  Social Connections: Socially Integrated (12/19/2023)   Received from [facility]   Social Network    How would you rate your social network (family, work, friends)?: Good participation with social networks  Intimate Partner Violence: Not At Risk (09/17/2023)   Humiliation, Afraid, Rape, and Kick questionnaire    Fear of Current or Ex-Partner: No    Emotionally Abused: No    Physically Abused: No    Sexually Abused: No    Past Medical History, Surgical history, Social history, and Family history were reviewed and updated as appropriate.   Please see review of systems for further details on the patient's review from today.   Objective:   Physical Exam:  There were no vitals taken for this visit.  Physical Exam Constitutional:      General: She is not  in acute distress. Musculoskeletal:        General: No  deformity.  Neurological:     Mental Status: She is alert and oriented to person, place, and time.     Coordination: Coordination normal.  Psychiatric:        Attention and Perception: Attention and perception normal. She does not perceive auditory or visual hallucinations.        Mood and Affect: Mood normal. Mood is not anxious or depressed. Affect is not labile, blunt, angry or inappropriate.        Speech: Speech normal.        Behavior: Behavior normal.        Thought Content: Thought content normal. Thought content is not paranoid or delusional. Thought content does not include homicidal or suicidal ideation. Thought content does not include homicidal or suicidal plan.        Cognition and Memory: Cognition and memory normal.        Judgment: Judgment normal.     Comments: Insight intact     Lab Review:     Component Value Date/Time   NA 137 04/02/2021 1339   K 3.9 04/02/2021 1339   CL 105 04/02/2021 1339   CO2 24 04/02/2021 1339   GLUCOSE 95 04/02/2021 1339   BUN 10 04/02/2021 1339   CREATININE 1.08 (H) 04/02/2021 1339   CALCIUM 9.3 04/02/2021 1339   PROT 8.0 03/31/2020 1217   ALBUMIN 4.6 03/31/2020 1217   AST 28 03/31/2020 1217   ALT 29 03/31/2020 1217   ALKPHOS 84 03/31/2020 1217   BILITOT 0.8 03/31/2020 1217   GFRNONAA >60 04/02/2021 1339   GFRAA >60 03/31/2020 1217       Component Value Date/Time   WBC 6.3 04/02/2021 1339   RBC 4.40 04/02/2021 1339   HGB 14.0 04/02/2021 1339   HGB 13.5 07/25/2017 1238   HCT 42.9 04/02/2021 1339   HCT 42.4 07/25/2017 1238   PLT 313 04/02/2021 1339   PLT 300 07/25/2017 1238   MCV 97.5 04/02/2021 1339   MCV 91 07/25/2017 1238   MCH 31.8 04/02/2021 1339   MCHC 32.6 04/02/2021 1339   RDW 12.6 04/02/2021 1339   RDW 13.2 07/25/2017 1238   LYMPHSABS 1.9 04/02/2021 1339   LYMPHSABS 4.6 (H) 07/25/2017 1238   MONOABS 0.6 04/02/2021 1339   EOSABS 0.1 04/02/2021 1339   EOSABS 0.2 07/25/2017 1238   BASOSABS 0.0 04/02/2021 1339    BASOSABS 0.0 07/25/2017 1238    No results found for: POCLITH, LITHIUM   No results found for: PHENYTOIN, PHENOBARB, VALPROATE, CBMZ   .res Assessment: Plan:   Plan:  PDMP reviewed  Rexulti  2mg  daily Lamictal  100mg  daily Wellbutrin  XL300mg  daily Adderall XR 30mg  daily Adderall 10mg  in the afternoon  PCP prescribes Ambien    Monitor BP between visits while taking stimulant medication.   FMLA paperwork completed.  25 minutes spent dedicated to the care of this patient on the date of this encounter to include pre-visit review of records, ordering of medication, post visit documentation, and face-to-face time with the patient discussing ADHD, GAD, obsessional thoughts and BPD.  RTC 3 months  Patient advised to contact office with any questions, adverse effects, or acute worsening in signs and symptoms.  Discussed potential metabolic side effects associated with atypical antipsychotics, as well as potential risk for movement side effects. Advised pt to contact office if movement side effects occur.    Discussed potential benefits, risks, and side effects of stimulants with patient  to include increased heart rate, palpitations, insomnia, increased anxiety, increased irritability, or decreased appetite. Instructed patient to contact office if experiencing any significant tolerability issues.   There are no diagnoses linked to this encounter.   Please see After Visit Summary for patient specific instructions.  Future Appointments  Date Time Provider Department Center  04/20/2024  5:00 PM Emma-Lee Oddo Nattalie, NP [facility] None    No orders of the defined types were placed in this encounter.     -------------------------------

## 2024-05-03 ENCOUNTER — Telehealth: Payer: Self-pay

## 2024-05-03 NOTE — Telephone Encounter (Signed)
  [facility] says that Megestrol  is on national back order and they have the liquid available. Patient would like to get the liquid form.

## 2024-08-02 ENCOUNTER — Encounter: Payer: Self-pay | Admitting: Adult Health

## 2024-08-02 ENCOUNTER — Telehealth (INDEPENDENT_AMBULATORY_CARE_PROVIDER_SITE_OTHER): Admitting: Adult Health

## 2024-08-02 DIAGNOSIS — F411 Generalized anxiety disorder: Secondary | ICD-10-CM | POA: Diagnosis not present

## 2024-08-02 DIAGNOSIS — F902 Attention-deficit hyperactivity disorder, combined type: Secondary | ICD-10-CM

## 2024-08-02 DIAGNOSIS — F428 Other obsessive-compulsive disorder: Secondary | ICD-10-CM

## 2024-08-02 DIAGNOSIS — F319 Bipolar disorder, unspecified: Secondary | ICD-10-CM | POA: Diagnosis not present

## 2024-08-02 DIAGNOSIS — F909 Attention-deficit hyperactivity disorder, unspecified type: Secondary | ICD-10-CM | POA: Diagnosis not present

## 2024-08-02 MED ORDER — AMPHETAMINE-DEXTROAMPHETAMINE 10 MG PO TABS: 10.0000 mg | ORAL_TABLET | Freq: Every day | ORAL | 0 refills | Status: DC

## 2024-08-02 MED ORDER — AMPHETAMINE-DEXTROAMPHET ER 30 MG PO CP24: 30.0000 mg | ORAL_CAPSULE | Freq: Every day | ORAL | 0 refills | Status: DC

## 2024-08-02 MED ORDER — LAMOTRIGINE 100 MG PO TABS: ORAL_TABLET | ORAL | 5 refills | Status: AC

## 2024-08-02 MED ORDER — BUPROPION HCL ER (XL) 300 MG PO TB24: 300.0000 mg | ORAL_TABLET | Freq: Every evening | ORAL | 5 refills | Status: AC

## 2024-08-02 MED ORDER — BREXPIPRAZOLE 2 MG PO TABS: ORAL_TABLET | ORAL | 5 refills | Status: AC

## 2024-08-02 NOTE — Progress Notes (Signed)
 Marisa Johnson [id number] 1975-12-04 48 y.o.  Virtual Visit via Video Note  I connected with pt @ on 08/02/24 at  5:00 PM EDT by a video enabled telemedicine application and verified that I am speaking with the correct person using two identifiers.   I discussed the limitations of evaluation and management by telemedicine and the availability of in person appointments. The patient expressed understanding and agreed to proceed.  I discussed the assessment and treatment plan with the patient. The patient was provided an opportunity to ask questions and all were answered. The patient agreed with the plan and demonstrated an understanding of the instructions.   The patient was advised to call back or seek an in-person evaluation if the symptoms worsen or if the condition fails to improve as anticipated.  I provided 25 minutes of non-face-to-face time during this encounter.  The patient was located at home.  The provider was located at [facility].   Angeline LOISE Sayers, NP   Subjective:   Patient ID:  Marisa Johnson is a 48 y.o. (DOB 1976/10/10) female.  Chief Complaint: No chief complaint on file.   HPI BIANEY TESORO presents for follow-up of ADHD, MDD, GAD, obsessional thoughts, and BPD.  Diagnosed with Bipolar in 2011 - mania. Reports thyroid  dysregulation. Also has chronic fatigue related to Covid.   Patient seen today for medication management.  Describes mood today as ok. Pleasant. Denies tearfulness. Mood symptoms - denies anxiety, depression and irritability. Reports stable interest and motivation. Reports panic attacks after stopping clonazepam  - restarted medication. Denies worry, rumination and over thinking. Denies obsessive thoughts and acts. Reports father passed away May 15, 2024. Reports mood is stable. Stating I feel like I'm doing ok. Feels like medications are working well for her. Taking medications as prescribed.  Energy levels ok. Active, does not have a regular  exercise routine.  Enjoys some usual interests and activities. Divorced. Dating. Has 3 grown children. Spending time with family.  Appetite adequate. Weight gain - 216 to 223 pounds - restarted Mounjaro 7.5. Averages 8 to 10 hours most nights.  Focus and concentration improved. Completing tasks. Managing aspects of household. 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 Denies SI or HI. Denies AH or VH. Consumes alcohol - has a few drinks a week. Denies THC use. History of self harm - clean since December 2023.   Previous medication trials: Adderall, Abilify, Ambien  Clonazepam , Wellbutrin , Rexulti , Buspar, Lamictal , Lithium, Adderall XR    Review of Systems:  Review of Systems  Musculoskeletal:  Negative for gait problem.  Neurological:  Negative for tremors.  Psychiatric/Behavioral:         Please refer to HPI    Medications: I have reviewed the patient's current medications.  Current Outpatient Medications  Medication Sig Dispense Refill   AIMOVIG 70 MG/ML SOAJ Inject 1 Syringe into the skin every 30 (thirty) days.     amphetamine -dextroamphetamine  (ADDERALL XR) 30 MG 24 hr capsule Take 1 capsule (30 mg total) by mouth daily. 30 capsule 0   amphetamine -dextroamphetamine  (ADDERALL XR) 30 MG 24 hr capsule Take 1 capsule (30 mg total) by mouth daily. 30 capsule 0   amphetamine -dextroamphetamine  (ADDERALL XR) 30 MG 24 hr capsule Take 1 capsule (30 mg total) by mouth daily. 30 capsule 0   amphetamine -dextroamphetamine  (ADDERALL) 10 MG tablet Take 1 tablet (10 mg total) by mouth daily. 30 tablet 0   amphetamine -dextroamphetamine  (ADDERALL) 10 MG tablet Take 1 tablet (10 mg total) by mouth daily. 30 tablet 0  amphetamine -dextroamphetamine  (ADDERALL) 10 MG tablet Take 1 tablet (10 mg total) by mouth daily. 30 tablet 0   brexpiprazole  (REXULTI ) 2 MG TABS tablet TAKE 1 TABLET(2 MG) BY MOUTH DAILY 30 tablet 5   buPROPion  (WELLBUTRIN  XL) 300 MG 24 hr tablet Take 1 tablet (300 mg total) by mouth every  evening. 30 tablet 5   cyanocobalamin (VITAMIN B12) 1000 MCG/ML injection Inject 1,000 mcg into the skin every 30 (thirty) days.     fluconazole  (DIFLUCAN ) 150 MG tablet Take by mouth.     fluticasone  (FLONASE) 50 MCG/ACT nasal spray Place 1 spray into both nostrils daily.     lamoTRIgine  (LAMICTAL ) 100 MG tablet TAKE 1 TABLET BY MOUTH DAILY 30 tablet 5   levocetirizine (XYZAL) 5 MG tablet Take 5 mg by mouth at bedtime.     levothyroxine  (SYNTHROID ) 112 MCG tablet Take 112 mcg by mouth daily.     megestrol  (MEGACE ) 40 MG tablet TAKE 1 TABLET(40 MG) BY MOUTH DAILY 30 tablet 6   metoprolol succinate (TOPROL-XL) 25 MG 24 hr tablet Take 25 mg by mouth 2 (two) times daily.     MOUNJARO 15 MG/0.5ML Pen SMARTSIG:15 Milligram(s) SUB-Q Once a Week     pantoprazole (PROTONIX) 40 MG tablet Take by mouth.     promethazine  (PHENERGAN ) 25 MG tablet Take 25 mg by mouth every 6 (six) hours as needed.      Ubrogepant (UBRELVY) 100 MG TABS Take by mouth.     valACYclovir (VALTREX) 1000 MG tablet TAKE 1 TABLET(1000 MG) BY MOUTH DAILY AS NEEDED     zolpidem  (AMBIEN ) 10 MG tablet Take 10 mg by mouth daily.     No current facility-administered medications for this visit.    Medication Side Effects: None  Allergies:  Allergies  Allergen Reactions   Abilify [Aripiprazole] Other (See Comments)    Reaction:  Restless leg syndrome    Other     Bean Sprouts   Reglan [Metoclopramide] Other (See Comments)    Reaction:  Hallucinations    Triptans Other (See Comments)    Elevated blood pressure    Dexamethasone  Rash    Past Medical History:  Diagnosis Date   ADHD    Anxiety    Carpal tunnel syndrome    Chronic fatigue    Connective tissue disorder    Contraceptive management 08/09/2015   Decreased libido 08/09/2015   Friable cervix 08/09/2015   History of abnormal cervical Pap smear 08/09/2015   Hypertension    Hypothyroidism    IBS (irritable bowel syndrome)    Irregular intermenstrual bleeding  08/09/2015   Major depressive disorder    Migraine    Mixed connective tissue disease    Pseudobulbar affect    PTSD (post-traumatic stress disorder)    Sjogren's syndrome with lung involvement (HCC)    Thyroid  disease    Vaginal Pap smear, abnormal     Family History  Problem Relation Age of Onset   Diabetes Mother    Hypertension Mother    Hypothyroidism Mother    Hypertension Father    Stroke Father    Cancer Father        prostate, with metastisis   Cancer Maternal Grandmother        Gaulbladder   Kidney disease Paternal Grandmother    Hypertension Paternal Grandmother    Hyperlipidemia Paternal Grandmother    Heart disease Paternal Grandmother    Heart attack Paternal Grandmother    Diabetes Daughter    Hypothyroidism Daughter  Raynaud syndrome Daughter    Rheumatologic disease Paternal Grandfather    Anxiety disorder Sister    Alcoholism Sister    Anxiety disorder Brother    Drug abuse Brother    Anxiety disorder Sister    Cancer Maternal Uncle        Lung   Colon cancer Neg Hx     Social History   Socioeconomic History   Marital status: Divorced    Spouse name: Not on file   Number of children: 3   Years of education: Not on file   Highest education level: Not on file  Occupational History   Occupation: Designer, jewellery    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    Comment: ED, 12 years  Tobacco Use   Smoking status: Never   Smokeless tobacco: Never   Tobacco comments:    Never smoker  Vaping Use   Vaping status: Never Used  Substance and Sexual Activity   Alcohol use: Yes   Drug use: No   Sexual activity: Yes    Birth control/protection: Surgical    Comment: tubal and ablation  Other Topics Concern   Not on file  Social History Narrative   Not on file   Social Drivers of Health   Financial Resource Strain: Low Risk  (12/19/2023)   Received from [facility]   Overall Financial Resource Strain (CARDIA)    Difficulty of Paying Living Expenses:  Not hard at all  Food Insecurity: No Food Insecurity (12/19/2023)   Received from [facility]   Hunger Vital Sign    Within the past 12 months, you worried that your food would run out before you got the money to buy more.: Never true    Within the past 12 months, the food you bought just didn't last and you didn't have money to get more.: Never true  Transportation Needs: No Transportation Needs (12/19/2023)   Received from [facility] - Transportation    Lack of Transportation (Medical): No    Lack of Transportation (Non-Medical): No  Physical Activity: Insufficiently Active (12/19/2023)   Received from [facility]   Exercise Vital Sign    On average, how many days per week do you engage in moderate to strenuous exercise (like a brisk walk)?: 1 day    On average, how many minutes do you engage in exercise at this level?: 20 min  Stress: No Stress Concern Present (12/19/2023)   Received from [facility] - Occupational Stress Questionnaire    Feeling of Stress : Not at all  Social Connections: Socially Integrated (12/19/2023)   Received from [facility]   Social Network    How would you rate your social network (family, work, friends)?: Good participation with social networks  Intimate Partner Violence: Not At Risk (09/17/2023)   Humiliation, Afraid, Rape, and Kick questionnaire    Fear of Current or Ex-Partner: No    Emotionally Abused: No    Physically Abused: No    Sexually Abused: No    Past Medical History, Surgical history, Social history, and Family history were reviewed and updated as appropriate.   Please see review of systems for further details on the patient's review from today.   Objective:   Physical Exam:  There were no vitals taken for this visit.  Physical Exam Constitutional:      General: She is not in acute distress. Musculoskeletal:        General: No deformity.  Neurological:     Mental Status:  She is alert and oriented to person, place, and time.     Coordination: Coordination normal.  Psychiatric:        Attention and Perception: Attention and perception normal. She does not perceive auditory or visual hallucinations.        Mood and Affect: Mood normal. Mood is not anxious or depressed. Affect is not labile, blunt, angry or inappropriate.        Speech: Speech normal.        Behavior: Behavior normal.        Thought Content: Thought content normal. Thought content is not paranoid or delusional. Thought content does not include homicidal or suicidal ideation. Thought content does not include homicidal or suicidal plan.        Cognition and Memory: Cognition and memory normal.        Judgment: Judgment normal.     Comments: Insight intact     Lab Review:     Component Value Date/Time   NA 137 04/02/2021 1339   K 3.9 04/02/2021 1339   CL 105 04/02/2021 1339   CO2 24 04/02/2021 1339   GLUCOSE 95 04/02/2021 1339   BUN 10 04/02/2021 1339   CREATININE 1.08 (H) 04/02/2021 1339   CALCIUM 9.3 04/02/2021 1339   PROT 8.0 03/31/2020 1217   ALBUMIN 4.6 03/31/2020 1217   AST 28 03/31/2020 1217   ALT 29 03/31/2020 1217   ALKPHOS 84 03/31/2020 1217   BILITOT 0.8 03/31/2020 1217   GFRNONAA >60 04/02/2021 1339   GFRAA >60 03/31/2020 1217       Component Value Date/Time   WBC 6.3 04/02/2021 1339   RBC 4.40 04/02/2021 1339   HGB 14.0 04/02/2021 1339   HGB 13.5 07/25/2017 1238   HCT 42.9 04/02/2021 1339   HCT 42.4 07/25/2017 1238   PLT 313 04/02/2021 1339   PLT 300 07/25/2017 1238   MCV 97.5 04/02/2021 1339   MCV 91 07/25/2017 1238   MCH 31.8 04/02/2021 1339   MCHC 32.6 04/02/2021 1339   RDW 12.6 04/02/2021 1339   RDW 13.2 07/25/2017 1238   LYMPHSABS 1.9 04/02/2021 1339   LYMPHSABS 4.6 (H) 07/25/2017 1238   MONOABS 0.6 04/02/2021 1339   EOSABS 0.1 04/02/2021 1339   EOSABS 0.2 07/25/2017 1238   BASOSABS 0.0 04/02/2021 1339   BASOSABS 0.0 07/25/2017 1238    No results  found for: POCLITH, LITHIUM   No results found for: PHENYTOIN, PHENOBARB, VALPROATE, CBMZ   .res Assessment: Plan:    Plan:  PDMP reviewed  Rexulti  2mg  daily Lamictal  100mg  daily Wellbutrin  XL300mg  daily Adderall XR 30mg  daily Adderall 10mg  in the afternoon  Patient will fax over FMLA form - previous forms should be on file.  PCP prescribes Ambien    Monitor BP between visits while taking stimulant medication.   25 minutes spent dedicated to the care of this patient on the date of this encounter to include pre-visit review of records, ordering of medication, post visit documentation, and face-to-face time with the patient discussing ADHD, GAD, obsessional thoughts and BPD.  RTC 3 months  Patient advised to contact office with any questions, adverse effects, or acute worsening in signs and symptoms.  Discussed potential metabolic side effects associated with atypical antipsychotics, as well as potential risk for movement side effects. Advised pt to contact office if movement side effects occur.    Discussed potential benefits, risks, and side effects of stimulants with patient to include increased heart  rate, palpitations, insomnia, increased anxiety, increased irritability, or decreased appetite. Instructed patient to contact office if experiencing any significant tolerability issues.   There are no diagnoses linked to this encounter.   Please see After Visit Summary for patient specific instructions.  Future Appointments  Date Time Provider Department Center  08/02/2024  5:00 PM Orhan Mayorga, Angeline Mattocks, NP [facility] None  10/25/2024  9:30 AM Signa Delon LABOR, NP [facility]    No orders of the defined types were placed in this encounter.     -------------------------------

## 2024-09-17 ENCOUNTER — Telehealth: Payer: Self-pay | Admitting: Adult Health

## 2024-09-17 NOTE — Telephone Encounter (Signed)
 Received FMLA ppwk via fax.

## 2024-09-21 DIAGNOSIS — Z0289 Encounter for other administrative examinations: Secondary | ICD-10-CM

## 2024-09-21 NOTE — Telephone Encounter (Signed)
 Pt paid $45 to complete and fax. Update of previous. Per pt all info same.

## 2024-09-29 NOTE — Telephone Encounter (Signed)
FMLA paperwork completed and faxed.

## 2024-10-11 ENCOUNTER — Other Ambulatory Visit: Payer: Self-pay | Admitting: Adult Health

## 2024-10-11 ENCOUNTER — Ambulatory Visit: Admitting: Adult Health

## 2024-10-11 MED ORDER — MEGESTROL ACETATE 40 MG PO TABS: 40.0000 mg | ORAL_TABLET | Freq: Every day | ORAL | 6 refills | Status: AC

## 2024-10-11 NOTE — Progress Notes (Signed)
 Refilled megace

## 2024-10-25 ENCOUNTER — Ambulatory Visit (INDEPENDENT_AMBULATORY_CARE_PROVIDER_SITE_OTHER): Admitting: Adult Health

## 2024-10-25 ENCOUNTER — Encounter: Payer: Self-pay | Admitting: Adult Health

## 2024-10-25 ENCOUNTER — Ambulatory Visit: Admitting: Orthopedic Surgery

## 2024-10-25 VITALS — BP 123/84 | HR 92 | Ht 63.0 in | Wt 215.0 lb

## 2024-10-25 DIAGNOSIS — N921 Excessive and frequent menstruation with irregular cycle: Secondary | ICD-10-CM

## 2024-10-25 DIAGNOSIS — Z01419 Encounter for gynecological examination (general) (routine) without abnormal findings: Secondary | ICD-10-CM | POA: Diagnosis not present

## 2024-10-25 NOTE — Progress Notes (Signed)
 Patient ID: Marisa Johnson, female   DOB: [birth year], 49 y.o.   MRN: [id number] History of Present Illness: Marisa Johnson is a 49 year old white female, divorced, [id number] in for a well woman gyn exam. She works from home and uses heated throw over lap and has had  a rash at times in groin, area, none now. 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     Component Value Date/Time   DIAGPAP (A) 09/17/2023 1344    - Atypical squamous cells of undetermined significance (ASC-US )   DIAGPAP  06/27/2020 1526    - Negative for intraepithelial lesion or malignancy (NILM)   HPVHIGH Negative 09/17/2023 1344   HPVHIGH Negative 06/27/2020 1526   ADEQPAP  09/17/2023 1344    Satisfactory for evaluation; transformation zone component PRESENT.   ADEQPAP  06/27/2020 1526    Satisfactory for evaluation; transformation zone component PRESENT.    PCP is JAYSON Heller NP  Current Medications, Allergies, Past Medical History, Past Surgical History, Family History and Social History were reviewed in [employer] record.     Review of Systems: Patient denies any headaches, hearing loss, fatigue, blurred vision, shortness of breath, chest pain, abdominal pain, problems with bowel movements, urination, or intercourse. No  mood swings. Has pain left ankle, twisted it a week ago and wore a boot, but took off today and it still hurts See HPI for positives Wears glasses     Physical Exam:BP 123/84 (BP Location: Right Arm, Patient Position: Sitting, Cuff Size: Large)   Pulse 92   Ht 5' 3 (1.6 m)   Wt 215 lb (97.5 kg)   BMI 38.09 kg/m   General:  Well developed, well nourished, no acute distress Skin:  Warm and dry Neck:  Midline trachea, normal thyroid , good ROM, no lymphadenopathy Lungs; Clear to auscultation bilaterally Breast:  No dominant palpable mass, retraction, or nipple discharge Cardiovascular: Regular rate and rhythm Abdomen:  Soft, non tender, no hepatosplenomegaly Pelvic:  External  genitalia is normal in appearance, no lesions, has some darkened skin in groin area.  The vagina is normal in appearance. Urethra has no lesions or masses. The cervix is bulbous.  Uterus is felt to be normal size, shape, and contour.  No adnexal masses or tenderness noted.Bladder is non tender, no masses felt. Rectal: Deferred Extremities/musculoskeletal:  No varicosities noted, no clubbing or cyanosis. Mile swelling left ankle, may want to see ortho, had appt but cancelled  Psych:  No mood changes, alert and cooperative,seems happy AA is 4 Fall risk is moderate    10/25/2024    9:29 AM 09/17/2023    1:31 PM 03/13/2022    2:38 PM  Depression screen PHQ 2/9  Decreased Interest 0 1 1  Down, Depressed, Hopeless 0 0 1  PHQ - 2 Score 0 1 2  Altered sleeping 0 1 1  Tired, decreased energy 0 1 2  Change in appetite 0 0 0  Feeling bad or failure about yourself  0 0 1  Trouble concentrating 0 1 2  Moving slowly or fidgety/restless 0 0 0  Suicidal thoughts 0 0 0  PHQ-9 Score 0 4  8      Data saved with a previous flowsheet row definition   She is on meds     10/25/2024    9:29 AM 09/17/2023    1:32 PM 03/13/2022    2:38 PM 06/27/2020    3:31 PM  GAD 7 : Generalized Anxiety Score  Nervous, Anxious,  on Edge 0 1 1 0  Control/stop worrying 0 0 1 0  Worry too much - different things 0 1 1 0  Trouble relaxing 0 1 1 0  Restless 0 0 0 0  Easily annoyed or irritable 0 1 1 0  Afraid - awful might happen 0 1 1 0  Total GAD 7 Score 0 5 6 0  Anxiety Difficulty    Not difficult at all    Upstream - 10/25/24 9071       Pregnancy Intention Screening   Does the patient want to become pregnant in the next year? No    Does the patient's partner want to become pregnant in the next year? No    Would the patient like to discuss contraceptive options today? No      Contraception Wrap Up   Current Method Female Sterilization    End Method Female Sterilization    Contraception Counseling Provided No            Examination chaperoned by Clarita Salt LPN  Impression and plan: 1. Encounter for well woman exam with routine gynecological exam (Primary) Pap in 2027 Physical in 1 year Labs with PCP Mammogram was 03/22/24 asymmetry, with Follow up 03/30/24 benign Cologuard was negative 03/27/22, due this year Has appt with PCP today  2. Irregular intermenstrual bleeding Has resolved with megace , has refills

## 2024-11-02 ENCOUNTER — Encounter: Payer: Self-pay | Admitting: Adult Health

## 2024-11-02 ENCOUNTER — Telehealth: Admitting: Adult Health

## 2024-11-02 DIAGNOSIS — F411 Generalized anxiety disorder: Secondary | ICD-10-CM

## 2024-11-02 DIAGNOSIS — F902 Attention-deficit hyperactivity disorder, combined type: Secondary | ICD-10-CM

## 2024-11-02 DIAGNOSIS — F428 Other obsessive-compulsive disorder: Secondary | ICD-10-CM | POA: Diagnosis not present

## 2024-11-02 DIAGNOSIS — F319 Bipolar disorder, unspecified: Secondary | ICD-10-CM | POA: Diagnosis not present

## 2024-11-02 MED ORDER — AMPHETAMINE-DEXTROAMPHET ER 30 MG PO CP24: 30.0000 mg | ORAL_CAPSULE | Freq: Every day | ORAL | 0 refills | Status: AC

## 2024-11-02 MED ORDER — AMPHETAMINE-DEXTROAMPHETAMINE 10 MG PO TABS: 10.0000 mg | ORAL_TABLET | Freq: Every day | ORAL | 0 refills | Status: AC

## 2024-11-02 NOTE — Progress Notes (Signed)
 Marisa Johnson [id number] [birth year] 49 y.o.  Virtual Visit via Video Note  I connected with pt @ on 11/02/24 at  5:00 PM EST by a video enabled telemedicine application and verified that I am speaking with the correct person using two identifiers.   I discussed the limitations of evaluation and management by telemedicine and the availability of in person appointments. The patient expressed understanding and agreed to proceed.  I discussed the assessment and treatment plan with the patient. The patient was provided an opportunity to ask questions and all were answered. The patient agreed with the plan and demonstrated an understanding of the instructions.   The patient was advised to call back or seek an in-person evaluation if the symptoms worsen or if the condition fails to improve as anticipated.  I provided 25 minutes of non-face-to-face time during this encounter.  The patient was located at home.  The provider was located at [facility].   Angeline LOISE Sayers, NP   Subjective:   Patient ID:  Marisa Johnson is a 49 y.o. (DOB Mar 26, 1976) female.  Chief Complaint: No chief complaint on file.   HPI Marisa Johnson presents for follow-up of DHD, MDD, GAD, obsessional thoughts, and BPD.  Diagnosed with Bipolar in 2011 - mania. Reports thyroid  dysregulation. Also has chronic fatigue related to Covid.   Patient seen today for medication management.  Describes mood today as ok. Pleasant. Denies tearfulness. Mood symptoms - denies anxiety, depression and irritability. Reports stable interest and motivation. Denies panic attacks. Denies worry, rumination and over thinking. Denies obsessive thoughts and acts. Reports mood is stable. Stating I feel like I'm doing ok. Feels like medications are working well for her. Taking medications as prescribed.  Energy levels ok. Active, does not have a regular exercise routine.  Enjoys some usual interests and activities. Divorced - daughter  living with her. Dating. Has 3 grown children. Spending time with family.  Appetite adequate. Weight loss - 205 pounds from 223 pounds - Mounjaro 12.5. Averages 8 to 10 hours most nights.  Focus and concentration a little off. Completing tasks. Managing aspects of household. 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 Denies SI or HI. Denies AH or VH. Consumes alcohol - has a few drinks a week. Denies THC use. History of self harm - clean since December 2023.   Previous medication trials: Adderall, Abilify, Ambien  Clonazepam , Wellbutrin , Rexulti , Buspar, Lamictal , Lithium, Adderall XR  Review of Systems:  Review of Systems  Musculoskeletal:  Negative for gait problem.  Neurological:  Negative for tremors.  Psychiatric/Behavioral:         Please refer to HPI    Medications: I have reviewed the patient's current medications.  Current Outpatient Medications  Medication Sig Dispense Refill   AIMOVIG 70 MG/ML SOAJ Inject 1 Syringe into the skin every 30 (thirty) days.     amphetamine -dextroamphetamine  (ADDERALL XR) 30 MG 24 hr capsule Take 1 capsule (30 mg total) by mouth daily. 30 capsule 0   amphetamine -dextroamphetamine  (ADDERALL XR) 30 MG 24 hr capsule Take 1 capsule (30 mg total) by mouth daily. 30 capsule 0   amphetamine -dextroamphetamine  (ADDERALL XR) 30 MG 24 hr capsule Take 1 capsule (30 mg total) by mouth daily. 30 capsule 0   amphetamine -dextroamphetamine  (ADDERALL) 10 MG tablet Take 1 tablet (10 mg total) by mouth daily. 30 tablet 0   amphetamine -dextroamphetamine  (ADDERALL) 10 MG tablet Take 1 tablet (10 mg total) by mouth daily. 30 tablet 0   amphetamine -dextroamphetamine  (ADDERALL) 10 MG tablet  Take 1 tablet (10 mg total) by mouth daily. 30 tablet 0   brexpiprazole  (REXULTI ) 2 MG TABS tablet TAKE 1 TABLET(2 MG) BY MOUTH DAILY 30 tablet 5   buPROPion  (WELLBUTRIN  XL) 300 MG 24 hr tablet Take 1 tablet (300 mg total) by mouth every evening. 30 tablet 5   clonazePAM  (KLONOPIN ) 0.5 MG tablet  Take by mouth.     cyanocobalamin (VITAMIN B12) 1000 MCG/ML injection Inject 1,000 mcg into the skin every 30 (thirty) days.     fluticasone  (FLONASE) 50 MCG/ACT nasal spray Place 1 spray into both nostrils daily.     lamoTRIgine  (LAMICTAL ) 100 MG tablet TAKE 1 TABLET BY MOUTH DAILY 30 tablet 5   levocetirizine (XYZAL) 5 MG tablet Take 5 mg by mouth at bedtime.     levothyroxine  (SYNTHROID ) 112 MCG tablet Take 112 mcg by mouth daily.     megestrol  (MEGACE ) 40 MG tablet Take 1 tablet (40 mg total) by mouth daily. 30 tablet 6   metoprolol succinate (TOPROL-XL) 25 MG 24 hr tablet Take 25 mg by mouth 2 (two) times daily.     MOUNJARO 10 MG/0.5ML Pen Inject 10 mg into the skin.     ondansetron  (ZOFRAN ) 4 MG tablet Take 4 mg by mouth every 8 (eight) hours as needed.     pantoprazole (PROTONIX) 40 MG tablet Take by mouth.     Ubrogepant (UBRELVY) 100 MG TABS Take by mouth.     zolpidem  (AMBIEN ) 10 MG tablet Take 10 mg by mouth daily.     No current facility-administered medications for this visit.    Medication Side Effects: None  Allergies: Allergies[1]  Past Medical History:  Diagnosis Date   ADHD    Anxiety    Carpal tunnel syndrome    Chronic fatigue    Connective tissue disorder    Contraceptive management 08/09/2015   Decreased libido 08/09/2015   Friable cervix 08/09/2015   History of abnormal cervical Pap smear 08/09/2015   Hypertension    Hypothyroidism    IBS (irritable bowel syndrome)    Irregular intermenstrual bleeding 08/09/2015   Major depressive disorder    Migraine    Mixed connective tissue disease    Pseudobulbar affect    PTSD (post-traumatic stress disorder)    Sjogren's syndrome with lung involvement (HCC)    Thyroid  disease    Vaginal Pap smear, abnormal     Family History  Problem Relation Age of Onset   Rheumatologic disease Paternal Grandfather    Kidney disease Paternal Grandmother    Hypertension Paternal Grandmother    Hyperlipidemia Paternal  Grandmother    Heart disease Paternal Grandmother    Heart attack Paternal Grandmother    Cancer Maternal Grandmother        Gaulbladder   Hypertension Father    Stroke Father    Cancer Father        prostate, with metastisis   Diabetes Mother    Hypertension Mother    Hypothyroidism Mother    Anxiety disorder Brother    Drug abuse Brother    Anxiety disorder Sister    Alcoholism Sister    Anxiety disorder Sister    Diabetes Daughter    Hypothyroidism Daughter    Raynaud syndrome Daughter    Cancer Maternal Uncle        Lung   Colon cancer Neg Hx     Social History   Socioeconomic History   Marital status: Divorced    Spouse name: Not on file  Number of children: 3   Years of education: Not on file   Highest education level: Not on file  Occupational History   Occupation: Registered Nurse    Employer: [employer]    Comment: ED, 12 years  Tobacco Use   Smoking status: Never   Smokeless tobacco: Never   Tobacco comments:    Never smoker  Vaping Use   Vaping status: Never Used  Substance and Sexual Activity   Alcohol use: Yes   Drug use: No   Sexual activity: Yes    Birth control/protection: Surgical    Comment: tubal and ablation  Other Topics Concern   Not on file  Social History Narrative   Not on file   Social Drivers of Health   Tobacco Use: Low Risk (10/25/2024)   Received from [facility]   Patient History    Smoking Tobacco Use: Never    Smokeless Tobacco Use: Never    Passive Exposure: Never  Financial Resource Strain: Low Risk (10/25/2024)   Overall Financial Resource Strain (CARDIA)    Difficulty of Paying Living Expenses: Not hard at all  Food Insecurity: No Food Insecurity (10/25/2024)   Epic    Worried About Programme Researcher, Broadcasting/film/video in the Last Year: Never true    Ran Out of Food in the Last Year: Never true  Transportation Needs: No Transportation Needs (10/25/2024)   Epic    Lack of Transportation (Medical): No    Lack of  Transportation (Non-Medical): No  Physical Activity: Insufficiently Active (10/25/2024)   Exercise Vital Sign    Days of Exercise per Week: 1 day    Minutes of Exercise per Session: 30 min  Stress: No Stress Concern Present (10/25/2024)   [employer] of Occupational Health - Occupational Stress Questionnaire    Feeling of Stress: Not at all  Social Connections: Moderately Isolated (10/25/2024)   Social Connection and Isolation Panel    Frequency of Communication with Friends and Family: More than three times a week    Frequency of Social Gatherings with Friends and Family: Three times a week    Attends Religious Services: Never    Active Member of Clubs or Organizations: Yes    Attends Banker Meetings: Never    Marital Status: Divorced  Catering Manager Violence: Not At Risk (10/25/2024)   Epic    Fear of Current or Ex-Partner: No    Emotionally Abused: No    Physically Abused: No    Sexually Abused: No  Depression (PHQ2-9): Low Risk (10/25/2024)   Depression (PHQ2-9)    PHQ-2 Score: 0  Alcohol Screen: Low Risk (10/25/2024)   Alcohol Screen    Last Alcohol Screening Score (AUDIT): 4  Housing: Low Risk (10/25/2024)   Epic    Unable to Pay for Housing in the Last Year: No    Number of Times Moved in the Last Year: 0    Homeless in the Last Year: No  Utilities: Not At Risk (10/25/2024)   Epic    Threatened with loss of utilities: No  Health Literacy: Adequate Health Literacy (10/25/2024)   B1300 Health Literacy    Frequency of need for help with medical instructions: Never    Past Medical History, Surgical history, Social history, and Family history were reviewed and updated as appropriate.   Please see review of systems for further details on the patient's review from today.   Objective:   Physical Exam:  There were no vitals taken for this visit.  Physical Exam Constitutional:      General: She is not in acute distress. Musculoskeletal:        General:  No deformity.  Neurological:     Mental Status: She is alert and oriented to person, place, and time.     Coordination: Coordination normal.  Psychiatric:        Attention and Perception: Attention and perception normal. She does not perceive auditory or visual hallucinations.        Mood and Affect: Mood normal. Mood is not anxious or depressed. Affect is not labile, blunt, angry or inappropriate.        Speech: Speech normal.        Behavior: Behavior normal.        Thought Content: Thought content normal. Thought content is not paranoid or delusional. Thought content does not include homicidal or suicidal ideation. Thought content does not include homicidal or suicidal plan.        Cognition and Memory: Cognition and memory normal.        Judgment: Judgment normal.     Comments: Insight intact     Lab Review:     Component Value Date/Time   NA 137 04/02/2021 1339   K 3.9 04/02/2021 1339   CL 105 04/02/2021 1339   CO2 24 04/02/2021 1339   GLUCOSE 95 04/02/2021 1339   BUN 10 04/02/2021 1339   CREATININE 1.08 (H) 04/02/2021 1339   CALCIUM 9.3 04/02/2021 1339   PROT 8.0 03/31/2020 1217   ALBUMIN 4.6 03/31/2020 1217   AST 28 03/31/2020 1217   ALT 29 03/31/2020 1217   ALKPHOS 84 03/31/2020 1217   BILITOT 0.8 03/31/2020 1217   GFRNONAA >60 04/02/2021 1339   GFRAA >60 03/31/2020 1217       Component Value Date/Time   WBC 6.3 04/02/2021 1339   RBC 4.40 04/02/2021 1339   HGB 14.0 04/02/2021 1339   HGB 13.5 07/25/2017 1238   HCT 42.9 04/02/2021 1339   HCT 42.4 07/25/2017 1238   PLT 313 04/02/2021 1339   PLT 300 07/25/2017 1238   MCV 97.5 04/02/2021 1339   MCV 91 07/25/2017 1238   MCH 31.8 04/02/2021 1339   MCHC 32.6 04/02/2021 1339   RDW 12.6 04/02/2021 1339   RDW 13.2 07/25/2017 1238   LYMPHSABS 1.9 04/02/2021 1339   LYMPHSABS 4.6 (H) 07/25/2017 1238   MONOABS 0.6 04/02/2021 1339   EOSABS 0.1 04/02/2021 1339   EOSABS 0.2 07/25/2017 1238   BASOSABS 0.0 04/02/2021  1339   BASOSABS 0.0 07/25/2017 1238    No results found for: POCLITH, LITHIUM   No results found for: PHENYTOIN, PHENOBARB, VALPROATE, CBMZ   .res Assessment: Plan:    Plan:  PDMP reviewed  Rexulti  2mg  daily Lamictal  100mg  daily Wellbutrin  XL300mg  daily Adderall XR 30mg  daily Adderall 10mg  in the afternoon  Approved for FMLA  PCP prescribes Ambien    Monitor BP between visits while taking stimulant medication.   25 minutes spent dedicated to the care of this patient on the date of this encounter to include pre-visit review of records, ordering of medication, post visit documentation, and face-to-face time with the patient discussing ADHD, GAD, obsessional thoughts and BPD.  RTC 3 months  Patient advised to contact office with any questions, adverse effects, or acute worsening in signs and symptoms.  Discussed potential metabolic side effects associated with atypical antipsychotics, as well as potential risk for movement side effects. Advised pt to contact office if movement side effects occur.  Discussed potential benefits, risks, and side effects of stimulants with patient to include increased heart rate, palpitations, insomnia, increased anxiety, increased irritability, or decreased appetite. Instructed patient to contact office if experiencing any significant tolerability issues.   Diagnoses and all orders for this visit:  Bipolar I disorder (HCC)  Attention deficit hyperactivity disorder (ADHD), combined type  Generalized anxiety disorder  Obsessional thoughts     Please see After Visit Summary for patient specific instructions.  No future appointments.   No orders of the defined types were placed in this encounter.     -------------------------------     [1]  Allergies Allergen Reactions   Abilify [Aripiprazole] Other (See Comments)    Reaction:  Restless leg syndrome    Other     Bean Sprouts   Reglan [Metoclopramide] Other (See  Comments)    Reaction:  Hallucinations    Triptans Other (See Comments)    Elevated blood pressure    Dexamethasone  Rash
